# Patient Record
Sex: Female | Born: 1960
Health system: Southern US, Community
[De-identification: ages and names within clinical notes are randomized; demographics above are authoritative.]

## PROBLEM LIST (undated history)

## (undated) DIAGNOSIS — Z923 Personal history of irradiation: Secondary | ICD-10-CM

## (undated) DIAGNOSIS — Z9221 Personal history of antineoplastic chemotherapy: Secondary | ICD-10-CM

## (undated) DIAGNOSIS — M549 Dorsalgia, unspecified: Secondary | ICD-10-CM

## (undated) DIAGNOSIS — R0902 Hypoxemia: Secondary | ICD-10-CM

## (undated) DIAGNOSIS — Z800 Family history of malignant neoplasm of digestive organs: Secondary | ICD-10-CM

## (undated) DIAGNOSIS — M1990 Unspecified osteoarthritis, unspecified site: Secondary | ICD-10-CM

## (undated) DIAGNOSIS — Z9289 Personal history of other medical treatment: Secondary | ICD-10-CM

## (undated) DIAGNOSIS — K219 Gastro-esophageal reflux disease without esophagitis: Secondary | ICD-10-CM

## (undated) DIAGNOSIS — F419 Anxiety disorder, unspecified: Secondary | ICD-10-CM

## (undated) DIAGNOSIS — G8929 Other chronic pain: Secondary | ICD-10-CM

## (undated) DIAGNOSIS — I82409 Acute embolism and thrombosis of unspecified deep veins of unspecified lower extremity: Secondary | ICD-10-CM

## (undated) DIAGNOSIS — C189 Malignant neoplasm of colon, unspecified: Secondary | ICD-10-CM

## (undated) DIAGNOSIS — Z9889 Other specified postprocedural states: Secondary | ICD-10-CM

## (undated) DIAGNOSIS — IMO0001 Reserved for inherently not codable concepts without codable children: Secondary | ICD-10-CM

## (undated) DIAGNOSIS — Z8489 Family history of other specified conditions: Secondary | ICD-10-CM

## (undated) DIAGNOSIS — C50919 Malignant neoplasm of unspecified site of unspecified female breast: Secondary | ICD-10-CM

## (undated) DIAGNOSIS — I10 Essential (primary) hypertension: Secondary | ICD-10-CM

## (undated) DIAGNOSIS — O24419 Gestational diabetes mellitus in pregnancy, unspecified control: Secondary | ICD-10-CM

## (undated) DIAGNOSIS — Z803 Family history of malignant neoplasm of breast: Secondary | ICD-10-CM

## (undated) DIAGNOSIS — G43909 Migraine, unspecified, not intractable, without status migrainosus: Secondary | ICD-10-CM

## (undated) DIAGNOSIS — R112 Nausea with vomiting, unspecified: Secondary | ICD-10-CM

## (undated) DIAGNOSIS — IMO0002 Reserved for concepts with insufficient information to code with codable children: Secondary | ICD-10-CM

## (undated) DIAGNOSIS — C50311 Malignant neoplasm of lower-inner quadrant of right female breast: Secondary | ICD-10-CM

## (undated) HISTORY — PX: ENDOMETRIAL ABLATION: SHX621

## (undated) HISTORY — DX: Reserved for inherently not codable concepts without codable children: IMO0001

## (undated) HISTORY — DX: Family history of malignant neoplasm of breast: Z80.3

## (undated) HISTORY — PX: RADIOFREQUENCY ABLATION NERVES: SUR1070

## (undated) HISTORY — DX: Reserved for concepts with insufficient information to code with codable children: IMO0002

## (undated) HISTORY — DX: Malignant neoplasm of unspecified site of unspecified female breast: C50.919

## (undated) HISTORY — DX: Family history of malignant neoplasm of digestive organs: Z80.0

## (undated) HISTORY — PX: DENTAL SURGERY: SHX609

## (undated) HISTORY — PX: PLANTAR FASCIA RELEASE: SHX2239

## (undated) HISTORY — PX: EYE MUSCLE SURGERY: SHX370

## (undated) HISTORY — PX: BACK SURGERY: SHX140

---

## 1986-06-15 HISTORY — PX: TONSILLECTOMY: SUR1361

## 1994-06-15 HISTORY — PX: TUBAL LIGATION: SHX77

## 1997-09-11 ENCOUNTER — Other Ambulatory Visit: Admission: RE | Admit: 1997-09-11 | Discharge: 1997-09-11 | Payer: Self-pay | Admitting: Obstetrics and Gynecology

## 1998-12-05 ENCOUNTER — Other Ambulatory Visit: Admission: RE | Admit: 1998-12-05 | Discharge: 1998-12-05 | Payer: Self-pay | Admitting: Obstetrics and Gynecology

## 2000-09-21 ENCOUNTER — Other Ambulatory Visit: Admission: RE | Admit: 2000-09-21 | Discharge: 2000-09-21 | Payer: Self-pay | Admitting: Obstetrics and Gynecology

## 2002-01-04 ENCOUNTER — Other Ambulatory Visit: Admission: RE | Admit: 2002-01-04 | Discharge: 2002-01-04 | Payer: Self-pay | Admitting: Obstetrics and Gynecology

## 2002-05-02 ENCOUNTER — Ambulatory Visit (HOSPITAL_COMMUNITY): Admission: RE | Admit: 2002-05-02 | Discharge: 2002-05-02 | Payer: Self-pay | Admitting: Obstetrics and Gynecology

## 2004-08-14 ENCOUNTER — Other Ambulatory Visit: Admission: RE | Admit: 2004-08-14 | Discharge: 2004-08-14 | Payer: Self-pay | Admitting: Obstetrics and Gynecology

## 2010-06-15 DIAGNOSIS — Z9289 Personal history of other medical treatment: Secondary | ICD-10-CM

## 2010-06-15 HISTORY — DX: Personal history of other medical treatment: Z92.89

## 2013-06-15 HISTORY — PX: COLONOSCOPY W/ POLYPECTOMY: SHX1380

## 2013-06-15 HISTORY — PX: EXCISIONAL HEMORRHOIDECTOMY: SHX1541

## 2013-09-08 ENCOUNTER — Other Ambulatory Visit: Payer: Self-pay | Admitting: Orthopedic Surgery

## 2013-09-08 DIAGNOSIS — M533 Sacrococcygeal disorders, not elsewhere classified: Secondary | ICD-10-CM

## 2013-09-08 DIAGNOSIS — M545 Low back pain, unspecified: Secondary | ICD-10-CM

## 2013-09-14 ENCOUNTER — Other Ambulatory Visit: Payer: Self-pay | Admitting: Orthopedic Surgery

## 2013-09-14 DIAGNOSIS — M545 Low back pain, unspecified: Secondary | ICD-10-CM

## 2013-09-14 DIAGNOSIS — M533 Sacrococcygeal disorders, not elsewhere classified: Secondary | ICD-10-CM

## 2013-09-19 ENCOUNTER — Ambulatory Visit
Admission: RE | Admit: 2013-09-19 | Discharge: 2013-09-19 | Disposition: A | Discharge status: Home / Self Care | Payer: Worker's Compensation | Source: Ambulatory Visit | Attending: Orthopedic Surgery | Admitting: Orthopedic Surgery

## 2013-09-19 DIAGNOSIS — M533 Sacrococcygeal disorders, not elsewhere classified: Secondary | ICD-10-CM

## 2013-09-19 DIAGNOSIS — M545 Low back pain, unspecified: Secondary | ICD-10-CM

## 2013-09-19 MED ORDER — METHYLPREDNISOLONE ACETATE 40 MG/ML INJ SUSP (RADIOLOG
120.0000 mg | Freq: Once | INTRAMUSCULAR | Status: AC
  Administered 2013-09-19: 120 mg via INTRA_ARTICULAR

## 2014-02-13 ENCOUNTER — Other Ambulatory Visit: Payer: Self-pay | Admitting: Orthopedic Surgery

## 2014-02-16 ENCOUNTER — Encounter (HOSPITAL_COMMUNITY): Payer: Self-pay | Admitting: Pharmacy Technician

## 2014-02-21 ENCOUNTER — Encounter (HOSPITAL_COMMUNITY)
Admission: RE | Admit: 2014-02-21 | Discharge: 2014-02-21 | Disposition: A | Discharge status: Home / Self Care | Payer: Worker's Compensation | Source: Ambulatory Visit | Attending: Orthopedic Surgery | Admitting: Orthopedic Surgery

## 2014-02-21 ENCOUNTER — Encounter (HOSPITAL_COMMUNITY): Payer: Self-pay

## 2014-02-21 HISTORY — DX: Other specified postprocedural states: Z98.890

## 2014-02-21 HISTORY — DX: Migraine, unspecified, not intractable, without status migrainosus: G43.909

## 2014-02-21 HISTORY — DX: Nausea with vomiting, unspecified: R11.2

## 2014-02-21 HISTORY — DX: Gestational diabetes mellitus in pregnancy, unspecified control: O24.419

## 2014-02-21 LAB — URINALYSIS, ROUTINE W REFLEX MICROSCOPIC
Glucose, UA: NEGATIVE mg/dL
KETONES UR: 15 mg/dL — AB
NITRITE: POSITIVE — AB
PROTEIN: NEGATIVE mg/dL
Specific Gravity, Urine: 1.029 (ref 1.005–1.030)
UROBILINOGEN UA: 1 mg/dL (ref 0.0–1.0)
pH: 5 (ref 5.0–8.0)

## 2014-02-21 LAB — URINE MICROSCOPIC-ADD ON

## 2014-02-21 LAB — TYPE AND SCREEN
ABO/RH(D): AB POS
Antibody Screen: NEGATIVE

## 2014-02-21 LAB — CBC WITH DIFFERENTIAL/PLATELET
BASOS ABS: 0 10*3/uL (ref 0.0–0.1)
Basophils Relative: 0 % (ref 0–1)
EOS ABS: 0.1 10*3/uL (ref 0.0–0.7)
Eosinophils Relative: 2 % (ref 0–5)
HCT: 41.7 % (ref 36.0–46.0)
Hemoglobin: 14 g/dL (ref 12.0–15.0)
LYMPHS ABS: 2.1 10*3/uL (ref 0.7–4.0)
Lymphocytes Relative: 39 % (ref 12–46)
MCH: 30.6 pg (ref 26.0–34.0)
MCHC: 33.6 g/dL (ref 30.0–36.0)
MCV: 91 fL (ref 78.0–100.0)
Monocytes Absolute: 0.4 10*3/uL (ref 0.1–1.0)
Monocytes Relative: 8 % (ref 3–12)
NEUTROS PCT: 51 % (ref 43–77)
Neutro Abs: 2.7 10*3/uL (ref 1.7–7.7)
Platelets: 234 10*3/uL (ref 150–400)
RBC: 4.58 MIL/uL (ref 3.87–5.11)
RDW: 13.8 % (ref 11.5–15.5)
WBC: 5.3 10*3/uL (ref 4.0–10.5)

## 2014-02-21 LAB — COMPREHENSIVE METABOLIC PANEL
ALT: 32 U/L (ref 0–35)
AST: 24 U/L (ref 0–37)
Albumin: 4.2 g/dL (ref 3.5–5.2)
Alkaline Phosphatase: 88 U/L (ref 39–117)
Anion gap: 14 (ref 5–15)
BUN: 15 mg/dL (ref 6–23)
CO2: 22 mEq/L (ref 19–32)
Calcium: 9.3 mg/dL (ref 8.4–10.5)
Chloride: 106 mEq/L (ref 96–112)
Creatinine, Ser: 0.83 mg/dL (ref 0.50–1.10)
GFR calc Af Amer: >90 mL/min (ref >90)
GFR calc non Af Amer: 79 mL/min — ABNORMAL LOW (ref >90)
GLUCOSE: 118 mg/dL — AB (ref 70–99)
POTASSIUM: 4.1 meq/L (ref 3.7–5.3)
SODIUM: 142 meq/L (ref 137–147)
TOTAL PROTEIN: 7.2 g/dL (ref 6.0–8.3)
Total Bilirubin: 0.4 mg/dL (ref 0.3–1.2)

## 2014-02-21 LAB — ABO/RH: ABO/RH(D): AB POS

## 2014-02-21 LAB — APTT: aPTT: 29 seconds (ref 24–37)

## 2014-02-21 LAB — PROTIME-INR
INR: 1.06 (ref 0.00–1.49)
Prothrombin Time: 13.8 seconds (ref 11.6–15.2)

## 2014-02-21 LAB — SURGICAL PCR SCREEN
MRSA, PCR: NEGATIVE
Staphylococcus aureus: NEGATIVE

## 2014-02-21 MED ORDER — POVIDONE-IODINE 7.5 % EX SOLN
Freq: Once | CUTANEOUS | Status: DC
  Filled 2014-02-21: qty 118

## 2014-02-21 MED ORDER — CEFAZOLIN SODIUM-DEXTROSE 2-3 GM-% IV SOLR
2.0000 g | INTRAVENOUS | Status: DC
  Filled 2014-02-21: qty 50

## 2014-02-21 NOTE — Progress Notes (Signed)
Primary - dr. Marlyn Corporal - [facility] Does not have cardiologist Had ekg about 2 years ago after her dad passed away

## 2014-02-21 NOTE — Pre-Procedure Instructions (Signed)
Teresa Aguilar  02/21/2014   Your procedure is scheduled on:  Thursday, September 10th  Report to [facility] Admitting at 0900 AM.  Call this number if you have problems the morning of surgery: [phone]   Remember:   Do not eat food or drink liquids after midnight.   Take these medicines the morning of surgery with A SIP OF WATER: none   Do not wear jewelry, make-up or nail polish.  Do not wear lotions, powders, or perfumes. You may wear deodorant.  Do not shave 48 hours prior to surgery. Men may shave face and neck.  Do not bring valuables to the hospital.  [facility] is not responsible  for any belongings or valuables.               Contacts, dentures or bridgework may not be worn into surgery.  Leave suitcase in the car. After surgery it may be brought to your room.  For patients admitted to the hospital, discharge time is determined by your  treatment team.               Patients discharged the day of surgery will not be allowed to drive home.  Please read over the following fact sheets that you were given: Pain Booklet, Coughing and Deep Breathing, Blood Transfusion Information and Surgical Site Infection Prevention [facility] - Preparing for Surgery  Before surgery, you can play an important role.  Because skin is not sterile, your skin needs to be as free of germs as possible.  You can reduce the number of germs on you skin by washing with CHG (chlorahexidine gluconate) soap before surgery.  CHG is an antiseptic cleaner which kills germs and bonds with the skin to continue killing germs even after washing.  Please DO NOT use if you have an allergy to CHG or antibacterial soaps.  If your skin becomes reddened/irritated stop using the CHG and inform your nurse when you arrive at Short Stay.  Do not shave (including legs and underarms) for at least 48 hours prior to the first CHG shower.  You may shave your face.  Please follow these instructions  carefully:   1.  Shower with CHG Soap the night before surgery and the morning of Surgery.  2.  If you choose to wash your hair, wash your hair first as usual with your normal shampoo.  3.  After you shampoo, rinse your hair and body thoroughly to remove the shampoo.  4.  Use CHG as you would any other liquid soap.  You can apply CHG directly to the skin and wash gently with scrungie or a clean washcloth.  5.  Apply the CHG Soap to your body ONLY FROM THE NECK DOWN.  Do not use on open wounds or open sores.  Avoid contact with your eyes, ears, mouth and genitals (private parts).  Wash genitals (private parts) with your normal soap.  6.  Wash thoroughly, paying special attention to the area where your surgery will be performed.  7.  Thoroughly rinse your body with warm water from the neck down.  8.  DO NOT shower/wash with your normal soap after using and rinsing off the CHG Soap.  9.  Pat yourself dry with a clean towel.            10.  Wear clean pajamas.            11.  Place clean sheets on your bed the night of  your first shower and do not sleep with pets.  Day of Surgery  Do not apply any lotions/deoderants the morning of surgery.  Please wear clean clothes to the hospital/surgery center.

## 2014-02-22 ENCOUNTER — Ambulatory Visit (HOSPITAL_COMMUNITY): Payer: Worker's Compensation | Admitting: Anesthesiology

## 2014-02-22 ENCOUNTER — Ambulatory Visit (HOSPITAL_COMMUNITY)
Admission: RE | Admit: 2014-02-22 | Discharge: 2014-02-23 | Disposition: A | Discharge status: Home / Self Care | Payer: Worker's Compensation | Source: Ambulatory Visit | Attending: Orthopedic Surgery | Admitting: Orthopedic Surgery

## 2014-02-22 ENCOUNTER — Ambulatory Visit (HOSPITAL_COMMUNITY): Payer: Worker's Compensation

## 2014-02-22 ENCOUNTER — Encounter (HOSPITAL_COMMUNITY): Payer: Worker's Compensation | Admitting: Anesthesiology

## 2014-02-22 ENCOUNTER — Encounter (HOSPITAL_COMMUNITY): Payer: Self-pay | Admitting: *Deleted

## 2014-02-22 ENCOUNTER — Encounter (HOSPITAL_COMMUNITY)
Admission: RE | Disposition: A | Discharge status: Home / Self Care | Payer: Self-pay | Source: Ambulatory Visit | Attending: Orthopedic Surgery

## 2014-02-22 DIAGNOSIS — M2580 Other specified joint disorders, unspecified joint: Secondary | ICD-10-CM | POA: Insufficient documentation

## 2014-02-22 DIAGNOSIS — M532X8 Spinal instabilities, sacral and sacrococcygeal region: Secondary | ICD-10-CM | POA: Diagnosis present

## 2014-02-22 DIAGNOSIS — M533 Sacrococcygeal disorders, not elsewhere classified: Secondary | ICD-10-CM | POA: Insufficient documentation

## 2014-02-22 DIAGNOSIS — G43909 Migraine, unspecified, not intractable, without status migrainosus: Secondary | ICD-10-CM | POA: Insufficient documentation

## 2014-02-22 HISTORY — PX: SACROILIAC JOINT FUSION: SHX6088

## 2014-02-22 SURGERY — SACROILIAC JOINT FUSION
Anesthesia: General | Laterality: Right

## 2014-02-22 MED ORDER — PROPOFOL 10 MG/ML IV BOLUS
INTRAVENOUS | Status: DC | PRN
  Administered 2014-02-22: 200 mg via INTRAVENOUS

## 2014-02-22 MED ORDER — SCOPOLAMINE 1 MG/3DAYS TD PT72
MEDICATED_PATCH | TRANSDERMAL | Status: AC
  Filled 2014-02-22: qty 1

## 2014-02-22 MED ORDER — PHENYLEPHRINE HCL 10 MG/ML IJ SOLN
INTRAMUSCULAR | Status: DC | PRN
  Administered 2014-02-22 (×6): 80 ug via INTRAVENOUS

## 2014-02-22 MED ORDER — ROCURONIUM BROMIDE 50 MG/5ML IV SOLN
INTRAVENOUS | Status: AC
  Filled 2014-02-22: qty 1

## 2014-02-22 MED ORDER — CEFAZOLIN SODIUM-DEXTROSE 2-3 GM-% IV SOLR
INTRAVENOUS | Status: AC
  Administered 2014-02-22: 2 g via INTRAVENOUS
  Filled 2014-02-22: qty 50

## 2014-02-22 MED ORDER — HYDROMORPHONE HCL PF 1 MG/ML IJ SOLN
0.2500 mg | INTRAMUSCULAR | Status: DC | PRN
  Administered 2014-02-22 (×6): 0.5 mg via INTRAVENOUS

## 2014-02-22 MED ORDER — DEXAMETHASONE SODIUM PHOSPHATE 10 MG/ML IJ SOLN
INTRAMUSCULAR | Status: DC | PRN
  Administered 2014-02-22: 8 mg via INTRAVENOUS

## 2014-02-22 MED ORDER — ONDANSETRON HCL 4 MG/2ML IJ SOLN
INTRAMUSCULAR | Status: AC
  Filled 2014-02-22: qty 2

## 2014-02-22 MED ORDER — OXYCODONE HCL 5 MG PO TABS
ORAL_TABLET | ORAL | Status: AC
  Administered 2014-02-22: 5 mg
  Filled 2014-02-22: qty 1

## 2014-02-22 MED ORDER — PROPOFOL 10 MG/ML IV BOLUS
INTRAVENOUS | Status: AC
  Filled 2014-02-22: qty 20

## 2014-02-22 MED ORDER — LIDOCAINE HCL (CARDIAC) 20 MG/ML IV SOLN
INTRAVENOUS | Status: DC | PRN
  Administered 2014-02-22: 100 mg via INTRAVENOUS

## 2014-02-22 MED ORDER — BUPIVACAINE-EPINEPHRINE (PF) 0.25% -1:200000 IJ SOLN
INTRAMUSCULAR | Status: AC
  Filled 2014-02-22: qty 30

## 2014-02-22 MED ORDER — BUPIVACAINE-EPINEPHRINE (PF) 0.25% -1:200000 IJ SOLN
INTRAMUSCULAR | Status: DC | PRN
  Administered 2014-02-22: 17 mL via PERINEURAL

## 2014-02-22 MED ORDER — SODIUM CHLORIDE 0.9 % IJ SOLN
INTRAMUSCULAR | Status: AC
  Filled 2014-02-22: qty 10

## 2014-02-22 MED ORDER — EPHEDRINE SULFATE 50 MG/ML IJ SOLN
INTRAMUSCULAR | Status: AC
  Filled 2014-02-22: qty 1

## 2014-02-22 MED ORDER — LIDOCAINE HCL (CARDIAC) 20 MG/ML IV SOLN
INTRAVENOUS | Status: AC
  Filled 2014-02-22: qty 5

## 2014-02-22 MED ORDER — LACTATED RINGERS IV SOLN
INTRAVENOUS | Status: DC | PRN
  Administered 2014-02-22 (×2): via INTRAVENOUS

## 2014-02-22 MED ORDER — ROCURONIUM BROMIDE 100 MG/10ML IV SOLN
INTRAVENOUS | Status: DC | PRN
  Administered 2014-02-22: 40 mg via INTRAVENOUS

## 2014-02-22 MED ORDER — LACTATED RINGERS IV SOLN
INTRAVENOUS | Status: DC
  Administered 2014-02-22: 09:00:00 via INTRAVENOUS

## 2014-02-22 MED ORDER — SCOPOLAMINE 1 MG/3DAYS TD PT72
1.0000 | MEDICATED_PATCH | TRANSDERMAL | Status: DC
  Administered 2014-02-22: 1.5 mg via TRANSDERMAL

## 2014-02-22 MED ORDER — DIPHENHYDRAMINE HCL 50 MG/ML IJ SOLN
INTRAMUSCULAR | Status: DC | PRN
  Administered 2014-02-22: 12.5 mg via INTRAVENOUS

## 2014-02-22 MED ORDER — ACETAMINOPHEN 10 MG/ML IV SOLN
INTRAVENOUS | Status: AC
  Administered 2014-02-22: 1000 mg via INTRAVENOUS
  Filled 2014-02-22: qty 100

## 2014-02-22 MED ORDER — FENTANYL CITRATE 0.05 MG/ML IJ SOLN
INTRAMUSCULAR | Status: AC
  Filled 2014-02-22: qty 5

## 2014-02-22 MED ORDER — ONDANSETRON HCL 4 MG/2ML IJ SOLN
INTRAMUSCULAR | Status: DC | PRN
  Administered 2014-02-22: 4 mg via INTRAVENOUS

## 2014-02-22 MED ORDER — HYDROMORPHONE HCL PF 1 MG/ML IJ SOLN
INTRAMUSCULAR | Status: AC
  Filled 2014-02-22: qty 1

## 2014-02-22 MED ORDER — NEOSTIGMINE METHYLSULFATE 10 MG/10ML IV SOLN
INTRAVENOUS | Status: DC | PRN
  Administered 2014-02-22: 4 mg via INTRAVENOUS

## 2014-02-22 MED ORDER — GLYCOPYRROLATE 0.2 MG/ML IJ SOLN
INTRAMUSCULAR | Status: DC | PRN
  Administered 2014-02-22: .8 mg via INTRAVENOUS

## 2014-02-22 MED ORDER — ARTIFICIAL TEARS OP OINT
TOPICAL_OINTMENT | OPHTHALMIC | Status: DC | PRN
  Administered 2014-02-22: 1 via OPHTHALMIC

## 2014-02-22 MED ORDER — NEOSTIGMINE METHYLSULFATE 10 MG/10ML IV SOLN
INTRAVENOUS | Status: AC
  Filled 2014-02-22: qty 1

## 2014-02-22 MED ORDER — MIDAZOLAM HCL 2 MG/2ML IJ SOLN
INTRAMUSCULAR | Status: AC
  Filled 2014-02-22: qty 2

## 2014-02-22 MED ORDER — OXYCODONE-ACETAMINOPHEN 5-325 MG PO TABS
1.0000 | ORAL_TABLET | ORAL | Status: DC | PRN
  Administered 2014-02-22: 1 via ORAL
  Administered 2014-02-23 (×2): 2 via ORAL
  Filled 2014-02-22: qty 1
  Filled 2014-02-22 (×2): qty 2

## 2014-02-22 MED ORDER — FENTANYL CITRATE 0.05 MG/ML IJ SOLN
INTRAMUSCULAR | Status: DC | PRN
  Administered 2014-02-22: 100 ug via INTRAVENOUS

## 2014-02-22 MED ORDER — BUPIVACAINE HCL (PF) 0.25 % IJ SOLN
INTRAMUSCULAR | Status: AC
  Filled 2014-02-22: qty 30

## 2014-02-22 MED ORDER — MIDAZOLAM HCL 5 MG/5ML IJ SOLN
INTRAMUSCULAR | Status: DC | PRN
  Administered 2014-02-22: 2 mg via INTRAVENOUS

## 2014-02-22 MED ORDER — DIAZEPAM 5 MG PO TABS: 5.0000 mg | ORAL_TABLET | Freq: Four times a day (QID) | ORAL | Status: DC | PRN

## 2014-02-22 MED ORDER — SUCCINYLCHOLINE CHLORIDE 20 MG/ML IJ SOLN
INTRAMUSCULAR | Status: AC
  Filled 2014-02-22: qty 1

## 2014-02-22 MED ORDER — ARTIFICIAL TEARS OP OINT
TOPICAL_OINTMENT | OPHTHALMIC | Status: AC
  Filled 2014-02-22: qty 3.5

## 2014-02-22 MED ORDER — PROMETHAZINE HCL 25 MG/ML IJ SOLN: 6.2500 mg | INTRAMUSCULAR | Status: DC | PRN

## 2014-02-22 MED ORDER — GLYCOPYRROLATE 0.2 MG/ML IJ SOLN
INTRAMUSCULAR | Status: AC
Start: 2014-02-22 — End: 2014-02-22
  Filled 2014-02-22: qty 3

## 2014-02-22 SURGICAL SUPPLY — 56 items
APL SKNCLS STERI-STRIP NONHPOA (GAUZE/BANDAGES/DRESSINGS) ×1
BENZOIN TINCTURE PRP APPL 2/3 (GAUZE/BANDAGES/DRESSINGS) ×3 IMPLANT
BLADE SURG 10 STRL SS (BLADE) ×3 IMPLANT
BLADE SURG 11 STRL SS (BLADE) ×3 IMPLANT
BLADE SURG ROTATE 9660 (MISCELLANEOUS) ×3 IMPLANT
CANISTER SUCTION 2500CC (MISCELLANEOUS) ×3 IMPLANT
CAP-I-FUSE IMPLANT SYSTEM ×2 IMPLANT
CLOSURE WOUND 1/2 X4 (GAUZE/BANDAGES/DRESSINGS) ×1
COVER SURGICAL LIGHT HANDLE (MISCELLANEOUS) ×6 IMPLANT
DRAPE C-ARM 42X72 X-RAY (DRAPES) ×3 IMPLANT
DRAPE C-ARMOR (DRAPES) ×3 IMPLANT
DRAPE INCISE IOBAN 66X45 STRL (DRAPES) ×3 IMPLANT
DRAPE POUCH INSTRU U-SHP 10X18 (DRAPES) ×3 IMPLANT
DRAPE SURG 17X23 STRL (DRAPES) ×9 IMPLANT
DURAPREP 26ML APPLICATOR (WOUND CARE) ×3 IMPLANT
ELECT CAUTERY BLADE 6.4 (BLADE) ×3 IMPLANT
ELECT REM PT RETURN 9FT ADLT (ELECTROSURGICAL) ×3
ELECTRODE REM PT RTRN 9FT ADLT (ELECTROSURGICAL) ×1 IMPLANT
GAUZE SPONGE 4X4 12PLY STRL (GAUZE/BANDAGES/DRESSINGS) ×3 IMPLANT
GAUZE SPONGE 4X4 16PLY XRAY LF (GAUZE/BANDAGES/DRESSINGS) ×3 IMPLANT
GLOVE BIO SURGEON STRL SZ7 (GLOVE) ×3 IMPLANT
GLOVE BIO SURGEON STRL SZ8 (GLOVE) ×3 IMPLANT
GLOVE BIOGEL PI IND STRL 7.0 (GLOVE) ×1 IMPLANT
GLOVE BIOGEL PI IND STRL 8 (GLOVE) ×1 IMPLANT
GLOVE BIOGEL PI INDICATOR 7.0 (GLOVE) ×2
GLOVE BIOGEL PI INDICATOR 8 (GLOVE) ×2
GOWN STRL REUS W/ TWL LRG LVL3 (GOWN DISPOSABLE) ×2 IMPLANT
GOWN STRL REUS W/ TWL XL LVL3 (GOWN DISPOSABLE) ×1 IMPLANT
GOWN STRL REUS W/TWL LRG LVL3 (GOWN DISPOSABLE) ×6
GOWN STRL REUS W/TWL XL LVL3 (GOWN DISPOSABLE) ×3
KIT BASIN OR (CUSTOM PROCEDURE TRAY) ×3 IMPLANT
KIT ROOM TURNOVER OR (KITS) ×3 IMPLANT
MANIFOLD NEPTUNE II (INSTRUMENTS) ×3 IMPLANT
NDL HYPO 25GX1X1/2 BEV (NEEDLE) ×1 IMPLANT
NEEDLE 22X1 1/2 (OR ONLY) (NEEDLE) ×3 IMPLANT
NEEDLE HYPO 25GX1X1/2 BEV (NEEDLE) ×3 IMPLANT
NS IRRIG 1000ML POUR BTL (IV SOLUTION) ×3 IMPLANT
PACK UNIVERSAL I (CUSTOM PROCEDURE TRAY) ×3 IMPLANT
PAD ARMBOARD 7.5X6 YLW CONV (MISCELLANEOUS) ×6 IMPLANT
PENCIL BUTTON HOLSTER BLD 10FT (ELECTRODE) ×3 IMPLANT
SPONGE GAUZE 4X4 12PLY STER LF (GAUZE/BANDAGES/DRESSINGS) ×2 IMPLANT
SPONGE LAP 18X18 X RAY DECT (DISPOSABLE) ×3 IMPLANT
STAPLER VISISTAT 35W (STAPLE) ×3 IMPLANT
STRIP CLOSURE SKIN 1/2X4 (GAUZE/BANDAGES/DRESSINGS) ×2 IMPLANT
SUT MNCRL AB 4-0 PS2 18 (SUTURE) ×3 IMPLANT
SUT VIC AB 0 CT1 18XCR BRD 8 (SUTURE) ×1 IMPLANT
SUT VIC AB 0 CT1 8-18 (SUTURE) ×3
SUT VIC AB 2-0 CT2 18 VCP726D (SUTURE) ×3 IMPLANT
SYR BULB IRRIGATION 50ML (SYRINGE) ×3 IMPLANT
SYR CONTROL 10ML LL (SYRINGE) ×3 IMPLANT
TOWEL OR 17X24 6PK STRL BLUE (TOWEL DISPOSABLE) ×3 IMPLANT
TOWEL OR 17X26 10 PK STRL BLUE (TOWEL DISPOSABLE) ×6 IMPLANT
TUBE CONNECTING 12'X1/4 (SUCTIONS) ×1
TUBE CONNECTING 12X1/4 (SUCTIONS) ×2 IMPLANT
WATER STERILE IRR 1000ML POUR (IV SOLUTION) ×3 IMPLANT
YANKAUER SUCT BULB TIP NO VENT (SUCTIONS) ×3 IMPLANT

## 2014-02-22 NOTE — Progress Notes (Signed)
Spoke with Pricilla Holm, PA for dr. Lynann Bologna.  Patient noted to be having apnea and desaturations to the 50's while asleep, encouraged cough and deep breathing.  O2 saturations return to baseline of 90's when awakened but quickly return to 50's when asleep.  Patient returned to PACU pending admission to the hospital.

## 2014-02-22 NOTE — Anesthesia Procedure Notes (Signed)
Procedure Name: Intubation Date/Time: 02/22/2014 11:01 AM Performed by: Neldon Newport Pre-anesthesia Checklist: Patient identified, Emergency Drugs available, Timeout performed, Suction available and Patient being monitored Patient Re-evaluated:Patient Re-evaluated prior to inductionOxygen Delivery Method: Circle system utilized Preoxygenation: Pre-oxygenation with 100% oxygen Intubation Type: IV induction Ventilation: Mask ventilation without difficulty Laryngoscope Size: Mac and 3 Grade View: Grade I Tube type: Oral Tube size: 7.5 mm Number of attempts: 1 Placement Confirmation: positive ETCO2,  ETT inserted through vocal cords under direct vision and breath sounds checked- equal and bilateral Secured at: 22 cm Tube secured with: Tape Dental Injury: Teeth and Oropharynx as per pre-operative assessment

## 2014-02-22 NOTE — H&P (Signed)
     PREOPERATIVE H&P  Chief Complaint: Right low back pain  HPI: Teresa Aguilar is a 53 y.o. female who presents with ongoing pain in the right low back  Patient reports substantial relief with right SI joint injection  Patient has failed multiple forms of conservative care and continues to have pain (see office notes for additional details regarding the patient's full course of treatment)  Past Medical History  Diagnosis Date  . PONV (postoperative nausea and vomiting)   . Gestational diabetes   . Migraines    Past Surgical History  Procedure Laterality Date  . Endometrial ablation    . Colonoscopy w/ polypectomy    . Plantar fascia release Right   . Tonsillectomy    . Eye surgery      to fix cross-eye as child   History   Social History  . Marital Status: Married    Spouse Name: N/A    Number of Children: N/A  . Years of Education: N/A   Social History Main Topics  . Smoking status: Never Smoker   . Smokeless tobacco: Not on file  . Alcohol Use: No  . Drug Use: No  . Sexual Activity: Not on file   Other Topics Concern  . Not on file   Social History Narrative  . No narrative on file   No family history on file. Allergies  Allergen Reactions  . Morphine And Related Nausea And Vomiting  . Sulfa Antibiotics Hives   Prior to Admission medications   Medication Sig Start Date End Date Taking? Authorizing Provider  eletriptan (RELPAX) 40 MG tablet Take 40 mg by mouth as needed for migraine or headache. One tablet by mouth at onset of headache. May repeat in 2 hours if headache persists or recurs.   Yes Historical Provider, MD  loratadine-pseudoephedrine (CLARITIN-D 12-HOUR) 5-120 MG per tablet Take 1 tablet by mouth 2 (two) times daily as needed for allergies.   Yes Historical Provider, MD  naproxen (NAPROSYN) 500 MG tablet Take 500 mg by mouth 2 (two) times daily with a meal.   Yes Historical Provider, MD     All other systems have been reviewed and  were otherwise negative with the exception of those mentioned in the HPI and as above.  Physical Exam: There were no vitals filed for this visit.  General: Alert, no acute distress Cardiovascular: No pedal edema Respiratory: No cyanosis, no use of accessory musculature Skin: No lesions in the area of chief complaint Neurologic: Sensation intact distally Psychiatric: Patient is competent for consent with normal mood and affect Lymphatic: No axillary or cervical lymphadenopathy  MUSCULOSKELETAL: + TTP on right low back  Assessment/Plan: Right sided sacroiliac joint dysfunction Plan for Procedure(s): SACROILIAC JOINT FUSION   Sinclair Ship, MD 02/22/2014 7:12 AM

## 2014-02-22 NOTE — Progress Notes (Signed)
Orthopedic Tech Progress Note Patient Details:  Teresa Aguilar 05/23/1961 [id number]  Ortho Devices Type of Ortho Device: Crutches Ortho Device/Splint Interventions: Application   [name] 02/22/2014, 4:40 PM

## 2014-02-22 NOTE — Transfer of Care (Signed)
Immediate Anesthesia Transfer of Care Note  Patient: Teresa Aguilar  Procedure(s) Performed: Procedure(s) with comments: SACROILIAC JOINT FUSION (Right) - Right sided sacroiliac joint fusion  Patient Location: PACU  Anesthesia Type:General  Level of Consciousness: awake, alert  and oriented  Airway & Oxygen Therapy: Patient Spontanous Breathing and Patient connected to nasal cannula oxygen  Post-op Assessment: Report given to PACU RN, Post -op Vital signs reviewed and stable and Patient moving all extremities X 4  Post vital signs: Reviewed and stable  Complications: No apparent anesthesia complications

## 2014-02-22 NOTE — Op Note (Signed)
Teresa Aguilar, Teresa Aguilar NO.:  [id number]  MEDICAL RECORD NO.:  [id number]  LOCATION:  [id number]                        FACILITY:  [facility]  PHYSICIAN:  Phylliss Bob, MD      DATE OF BIRTH:  10/01/1960  DATE OF PROCEDURE:  02/22/2014                              OPERATIVE REPORT   PREOPERATIVE DIAGNOSIS:  Right-sided sacroiliac joint dysfunction.  POSTOPERATIVE DIAGNOSIS:  Right-sided sacroiliac joint dysfunction.  PROCEDURE:  Right-sided sacroiliac joint fusion using the iFuse sacroiliac joint fusion system.  SURGEON:  Phylliss Bob, MD  ASSISTANT:  Pricilla Holm, PA-C.  ANESTHESIA:  General endotracheal anesthesia.  COMPLICATIONS:  None.  DISPOSITION:  Stable.  ESTIMATED BLOOD LOSS:  Minimal.  INDICATIONS FOR SURGERY:  Briefly, Ms. Dunsmore is a very pleasant 53- year-old female, who did initially present to me on May 01, 2013, with substantial pain in the right side of her low back, status post a work injury that did occur on January 15, 2013.  The patient was worked up extensively, including a diagnostic right sacroiliac joint injection. The injection did provide her temporary relief.  Given her ongoing debilitating pain, we did discuss proceeding with the procedure as noted above, and she did elect to proceed.  OPERATIVE DETAILS:  On February 22, 2014, the patient was brought to surgery and general endotracheal anesthesia was administered.  The patient was placed prone on a well-padded flat Jackson bed.  Gel rolls were placed under the patient's chest and hips.  Region of the right buttock was prepped and draped in the usual fashion.  Time-out was performed.  I then made a 4 cm incision overlying the posterior border of the sacrum on the right side.  Three guidewires were advanced across the sacroiliac joint, 1 above the S1 foramen, 1 in line with it, and 1 beneath it.  I then drilled and broached over the guidewires and the appropriate sized  implants were advanced over the guidewires uneventfully.  I did liberally use lateral inlet and outlet fluoroscopy to confirm the appropriate positioning of the guidewires and implants. The guidewires were then removed.  I was very pleased with the press fit of each of the implants.  The wound was then copiously irrigated.  The fascia was then closed using 0 Vicryl, the subcutaneous layer was closed using 2-0 Vicryl, and the skin was closed using 3-0 Monocryl.  Benzoin and Steri- Strips were applied followed by sterile dressing.  All instrument counts were correct at the termination of the procedure.  Of note, Pricilla Holm was my assistant throughout surgery, and did aid in retraction, suctioning, and closure.     Phylliss Bob, MD     MD/MEDQ  D:  02/22/2014  T:  02/22/2014  Job:  [id number]

## 2014-02-22 NOTE — Progress Notes (Signed)
May give additional dilaudid 1 mg and oxycodone IR 5mg  tab - per Dr singer

## 2014-02-22 NOTE — Anesthesia Postprocedure Evaluation (Signed)
Anesthesia Post Note  Patient: Teresa Aguilar  Procedure(s) Performed: Procedure(s) (LRB): SACROILIAC JOINT FUSION (Right)  Anesthesia type: general  Patient location: PACU  Post pain: Pain level controlled  Post assessment: Patient's Cardiovascular Status Stable  Last Vitals:  Filed Vitals:   02/22/14 1415  BP:   Pulse: 71  Temp:   Resp: 18    Post vital signs: Reviewed and stable  Level of consciousness: sedated  Complications: No apparent anesthesia complications

## 2014-02-22 NOTE — Anesthesia Preprocedure Evaluation (Addendum)
Anesthesia Evaluation  Patient identified by MRN, date of birth, ID band Patient awake    Reviewed: Allergy & Precautions, H&P , NPO status , Patient's Chart, lab work & pertinent test results  History of Anesthesia Complications (+) PONV and history of anesthetic complications  Airway Mallampati: II TM Distance: >3 FB Neck ROM: full    Dental  (+) Dental Advidsory Given, Teeth Intact   Pulmonary neg pulmonary ROS,    Pulmonary exam normal       Cardiovascular negative cardio ROS      Neuro/Psych  Headaches, negative psych ROS   GI/Hepatic negative GI ROS, Neg liver ROS,   Endo/Other  diabetes  Renal/GU      Musculoskeletal   Abdominal   Peds  Hematology   Anesthesia Other Findings   Reproductive/Obstetrics                         Anesthesia Physical Anesthesia Plan  ASA: II  Anesthesia Plan: General ETT   Post-op Pain Management:    Induction:   Airway Management Planned:   Additional Equipment:   Intra-op Plan:   Post-operative Plan: Extubation in OR  Informed Consent: I have reviewed the patients History and Physical, chart, labs and discussed the procedure including the risks, benefits and alternatives for the proposed anesthesia with the patient or authorized representative who has indicated his/her understanding and acceptance.   Dental Advisory Given  Plan Discussed with: Anesthesiologist, CRNA and Surgeon  Anesthesia Plan Comments:       Anesthesia Quick Evaluation

## 2014-02-22 NOTE — Discharge Instructions (Signed)
General Anesthesia, Adult, Care After °Refer to this sheet in the next few weeks. These instructions provide you with information on caring for yourself after your procedure. Your health care provider may also give you more specific instructions. Your treatment has been planned according to current medical practices, but problems sometimes occur. Call your health care provider if you have any problems or questions after your procedure. °WHAT TO EXPECT AFTER THE PROCEDURE °After the procedure, it is typical to experience: °· Sleepiness. °· Nausea and vomiting. °HOME CARE INSTRUCTIONS °· For the first 24 hours after general anesthesia: °· Have a responsible person with you. °· Do not drive a car. If you are alone, do not take public transportation. °· Do not drink alcohol. °· Do not take medicine that has not been prescribed by your health care provider. °· Do not sign important papers or make important decisions. °· You may resume a normal diet and activities as directed by your health care provider. °· Change bandages (dressings) as directed. °· If you have questions or problems that seem related to general anesthesia, call the hospital and ask for the anesthetist or anesthesiologist on call. °SEEK MEDICAL CARE IF: °· You have nausea and vomiting that continue the day after anesthesia. °· You develop a rash. °SEEK IMMEDIATE MEDICAL CARE IF:  °· You have difficulty breathing. °· You have chest pain. °· You have any allergic problems. °Document Released: 09/07/2000 Document Revised: 02/01/2013 Document Reviewed: 12/15/2012 °[employer]® Patient Information ©2014 [employer]. ° ° ° °

## 2014-02-23 MED ORDER — INFLUENZA VAC SPLIT QUAD 0.5 ML IM SUSY: 0.5000 mL | PREFILLED_SYRINGE | INTRAMUSCULAR | Status: DC

## 2014-02-23 NOTE — Progress Notes (Signed)
    Patient doing well Admitted for desats in PACU yesterday and for pain control + expected right buttock pain   Physical Exam: Filed Vitals:   02/23/14 0658  BP: 122/61  Pulse: 72  Temp: 97.9 F (36.6 C)  Resp: 18   sats are 99% currently  Dressing in place NVI  POD #1 s/p right SI fusion, doing well with expected minimal right buttock pain  - encourage ambulation - Percocet for pain, Valium for muscle spasms - likely d/c home later today

## 2014-02-26 ENCOUNTER — Encounter (HOSPITAL_COMMUNITY): Payer: Self-pay | Admitting: Orthopedic Surgery

## 2014-03-01 NOTE — Discharge Summary (Signed)
Patient ID: Teresa Aguilar MRN: [id number] DOB/AGE: 1961-05-12 53 y.o.  Admit date: 02/22/2014 Discharge date: 02/23/2014  Admission Diagnoses:  Active Problems:   Instability of sacroiliac joint   Discharge Diagnoses:  Same  Past Medical History  Diagnosis Date  . PONV (postoperative nausea and vomiting)   . Gestational diabetes   . Migraines     Surgeries: Procedure(s): RIGHT SACROILIAC JOINT FUSION on 02/22/2014   Consultants:  None  Discharged Condition: Improved  Hospital Course: Teresa Aguilar is an 53 y.o. female who was admitted 02/22/2014 for operative treatment of sacroiliac joint dysfunction. Patient has severe unremitting pain that affects sleep, daily activities, and work/hobbies. After pre-op clearance the patient was taken to the operating room on 02/22/2014 and underwent  Procedure(s): SACROILIAC JOINT FUSION Right.    Patient was given perioperative antibiotics:  Anti-infectives   Start     Dose/Rate Route Frequency Ordered Stop   02/22/14 0941  ceFAZolin (ANCEF) 2-3 GM-% IVPB SOLR    Comments:  Elige Ko   : cabinet override      02/22/14 0941 02/22/14 1114   02/22/14 0600  ceFAZolin (ANCEF) IVPB 2 g/50 mL premix  Status:  Discontinued     2 g 100 mL/hr over 30 Minutes Intravenous On call to O.R. 02/21/14 1407 02/22/14 1639       Patient was given sequential compression devices, early ambulation to prevent DVT.  Patient benefited maximally from hospital stay and there were no complications.    Recent vital signs: BP 122/61  Pulse 72  Temp(Src) 97.9 F (36.6 C) (Oral)  Resp 18  Ht 5\' 7"  (1.702 m)  Wt 117.028 kg (258 lb)  BMI 40.40 kg/m2  SpO2 99%    Discharge Medications:     Medication List    STOP taking these medications       naproxen 500 MG tablet  Commonly known as:  NAPROSYN      TAKE these medications       eletriptan 40 MG tablet  Commonly known as:  RELPAX  Take 40 mg by mouth as needed for migraine  or headache. One tablet by mouth at onset of headache. May repeat in 2 hours if headache persists or recurs.     loratadine-pseudoephedrine 5-120 MG per tablet  Commonly known as:  CLARITIN-D 12-hour  Take 1 tablet by mouth 2 (two) times daily as needed for allergies.        Diagnostic Studies: Dg Chest 2 View  02/21/2014   CLINICAL DATA:  Preop for lumbar spine surgery  EXAM: CHEST  2 VIEW  COMPARISON:  None.  FINDINGS: No active infiltrate or effusion is seen. Mediastinal and hilar contours appear normal. The heart is within upper limits of normal. No bony abnormality is seen.  IMPRESSION: No active cardiopulmonary disease.   Electronically Signed   By: Ivar Drape M.D.   On: 02/21/2014 17:29   Dg Si Joints  02/22/2014   CLINICAL DATA:  Sacroiliac joint fusion  EXAM: DG C-ARM 61-120 MIN; BILATERAL SACROILIAC JOINTS - 3+ VIEW  COMPARISON:  None  FINDINGS: Frontal and lateral views were obtained. There are 3 metallic devices fusing the right sacroiliac joint. These devices appear intact. Alignment is anatomic. No fracture or diastases apparent.  IMPRESSION: Fusion device is in the right sacroiliac joint with alignment anatomic.   Electronically Signed   By: Lowella Grip M.D.   On: 02/22/2014 12:42   Dg C-arm 1-60 Min  02/22/2014  CLINICAL DATA:  Sacroiliac joint fusion  EXAM: DG C-ARM 61-120 MIN; BILATERAL SACROILIAC JOINTS - 3+ VIEW  COMPARISON:  None  FINDINGS: Frontal and lateral views were obtained. There are 3 metallic devices fusing the right sacroiliac joint. These devices appear intact. Alignment is anatomic. No fracture or diastases apparent.  IMPRESSION: Fusion device is in the right sacroiliac joint with alignment anatomic.   Electronically Signed   By: Lowella Grip M.D.   On: 02/22/2014 12:42    Disposition: 01-Home or Self Care   POD #1 s/p right SI fusion, doing well with expected minimal right buttock pain   - encourage ambulation  - Percocet for pain, Valium for  muscle spasms  - d/c home later today  -F/U in office 2 weeks   Signed: Justice Britain 03/01/2014, 11:21 AM

## 2014-03-15 DIAGNOSIS — I82409 Acute embolism and thrombosis of unspecified deep veins of unspecified lower extremity: Secondary | ICD-10-CM

## 2014-03-15 HISTORY — DX: Acute embolism and thrombosis of unspecified deep veins of unspecified lower extremity: I82.409

## 2014-03-20 ENCOUNTER — Other Ambulatory Visit (HOSPITAL_COMMUNITY): Payer: Self-pay | Admitting: Orthopedic Surgery

## 2014-03-20 ENCOUNTER — Emergency Department (HOSPITAL_COMMUNITY)
Admission: EM | Admit: 2014-03-20 | Discharge: 2014-03-20 | Disposition: A | Discharge status: Home / Self Care | Payer: Worker's Compensation | Attending: Emergency Medicine | Admitting: Emergency Medicine

## 2014-03-20 ENCOUNTER — Ambulatory Visit (HOSPITAL_COMMUNITY)
Admission: RE | Admit: 2014-03-20 | Discharge: 2014-03-20 | Disposition: A | Discharge status: Home / Self Care | Payer: Worker's Compensation | Source: Ambulatory Visit | Attending: Orthopedic Surgery | Admitting: Orthopedic Surgery

## 2014-03-20 ENCOUNTER — Encounter (HOSPITAL_COMMUNITY): Payer: Self-pay | Admitting: Emergency Medicine

## 2014-03-20 DIAGNOSIS — I824Z1 Acute embolism and thrombosis of unspecified deep veins of right distal lower extremity: Secondary | ICD-10-CM | POA: Insufficient documentation

## 2014-03-20 DIAGNOSIS — M7989 Other specified soft tissue disorders: Secondary | ICD-10-CM | POA: Diagnosis present

## 2014-03-20 DIAGNOSIS — M79661 Pain in right lower leg: Secondary | ICD-10-CM | POA: Diagnosis present

## 2014-03-20 DIAGNOSIS — Z8632 Personal history of gestational diabetes: Secondary | ICD-10-CM | POA: Diagnosis not present

## 2014-03-20 DIAGNOSIS — G43909 Migraine, unspecified, not intractable, without status migrainosus: Secondary | ICD-10-CM | POA: Insufficient documentation

## 2014-03-20 DIAGNOSIS — I82401 Acute embolism and thrombosis of unspecified deep veins of right lower extremity: Secondary | ICD-10-CM | POA: Insufficient documentation

## 2014-03-20 DIAGNOSIS — M79604 Pain in right leg: Secondary | ICD-10-CM

## 2014-03-20 DIAGNOSIS — R748 Abnormal levels of other serum enzymes: Secondary | ICD-10-CM | POA: Insufficient documentation

## 2014-03-20 DIAGNOSIS — M62831 Muscle spasm of calf: Secondary | ICD-10-CM

## 2014-03-20 DIAGNOSIS — R609 Edema, unspecified: Secondary | ICD-10-CM

## 2014-03-20 DIAGNOSIS — M25571 Pain in right ankle and joints of right foot: Secondary | ICD-10-CM | POA: Diagnosis present

## 2014-03-20 DIAGNOSIS — Z9889 Other specified postprocedural states: Secondary | ICD-10-CM | POA: Diagnosis not present

## 2014-03-20 LAB — COMPREHENSIVE METABOLIC PANEL
ALBUMIN: 4 g/dL (ref 3.5–5.2)
ALK PHOS: 118 U/L — AB (ref 39–117)
ALT: 20 U/L (ref 0–35)
ANION GAP: 12 (ref 5–15)
AST: 23 U/L (ref 0–37)
BILIRUBIN TOTAL: 0.3 mg/dL (ref 0.3–1.2)
BUN: 11 mg/dL (ref 6–23)
CO2: 25 mEq/L (ref 19–32)
CREATININE: 0.64 mg/dL (ref 0.50–1.10)
Calcium: 9.3 mg/dL (ref 8.4–10.5)
Chloride: 103 mEq/L (ref 96–112)
GFR calc Af Amer: >90 mL/min (ref >90)
GFR calc non Af Amer: >90 mL/min (ref >90)
GLUCOSE: 111 mg/dL — AB (ref 70–99)
Potassium: 4.2 mEq/L (ref 3.7–5.3)
Sodium: 140 mEq/L (ref 137–147)
TOTAL PROTEIN: 7.7 g/dL (ref 6.0–8.3)

## 2014-03-20 LAB — CBC WITH DIFFERENTIAL/PLATELET
BASOS PCT: 0 % (ref 0–1)
Basophils Absolute: 0 10*3/uL (ref 0.0–0.1)
EOS ABS: 0.1 10*3/uL (ref 0.0–0.7)
Eosinophils Relative: 2 % (ref 0–5)
HEMATOCRIT: 41.6 % (ref 36.0–46.0)
HEMOGLOBIN: 14 g/dL (ref 12.0–15.0)
LYMPHS ABS: 2 10*3/uL (ref 0.7–4.0)
Lymphocytes Relative: 35 % (ref 12–46)
MCH: 30.4 pg (ref 26.0–34.0)
MCHC: 33.7 g/dL (ref 30.0–36.0)
MCV: 90.4 fL (ref 78.0–100.0)
MONO ABS: 0.5 10*3/uL (ref 0.1–1.0)
MONOS PCT: 8 % (ref 3–12)
Neutro Abs: 3.2 10*3/uL (ref 1.7–7.7)
Neutrophils Relative %: 55 % (ref 43–77)
Platelets: 204 10*3/uL (ref 150–400)
RBC: 4.6 MIL/uL (ref 3.87–5.11)
RDW: 13.7 % (ref 11.5–15.5)
WBC: 5.8 10*3/uL (ref 4.0–10.5)

## 2014-03-20 LAB — PROTIME-INR
INR: 0.92 (ref 0.00–1.49)
Prothrombin Time: 12.4 seconds (ref 11.6–15.2)

## 2014-03-20 MED ORDER — XARELTO VTE STARTER PACK 15 & 20 MG PO TBPK: 15.0000 mg | ORAL_TABLET | ORAL | Status: DC

## 2014-03-20 MED ORDER — ELETRIPTAN HYDROBROMIDE 40 MG PO TABS
40.0000 mg | ORAL_TABLET | ORAL | Status: DC | PRN
  Administered 2014-03-20: 40 mg via ORAL
  Filled 2014-03-20: qty 1

## 2014-03-20 MED ORDER — ONDANSETRON 4 MG PO TBDP
4.0000 mg | ORAL_TABLET | Freq: Once | ORAL | Status: AC
  Administered 2014-03-20: 4 mg via ORAL
  Filled 2014-03-20: qty 1

## 2014-03-20 MED ORDER — RIVAROXABAN 20 MG PO TABS: 20.0000 mg | ORAL_TABLET | Freq: Every day | ORAL | Status: DC

## 2014-03-20 MED ORDER — RIVAROXABAN 15 MG PO TABS
15.0000 mg | ORAL_TABLET | Freq: Two times a day (BID) | ORAL | Status: DC
Start: 2014-03-20 — End: 2014-03-20
  Administered 2014-03-20: 15 mg via ORAL
  Filled 2014-03-20: qty 1

## 2014-03-20 NOTE — ED Notes (Signed)
Lab at bedside, pt medicated for nausea

## 2014-03-20 NOTE — ED Notes (Signed)
Patient states she is getting a migraine because she hasnt eaten all day. She also states her lower back hurts when she moves certain ways. She sayes that also is in pain because she hasnt taken any pain med all day but also states when she takes her percocet she gets nauseated

## 2014-03-20 NOTE — ED Notes (Signed)
Pt sent here for eval of DVT in right leg; pt had recent back sx

## 2014-03-20 NOTE — ED Notes (Signed)
Pt med received from pharmacy

## 2014-03-20 NOTE — ED Notes (Signed)
Called pharmacy to check on delay in getting pt medication for migraine that was requested at 1601

## 2014-03-20 NOTE — Discharge Instructions (Signed)

## 2014-03-20 NOTE — ED Notes (Signed)
PT GIVEN MORE SODA AND GRAHAM CRACKERS WITH PEANUT BUTTER

## 2014-03-20 NOTE — ED Provider Notes (Signed)
CSN: [id number]     Arrival date & time 03/20/14  1307 History   First MD Initiated Contact with Patient 03/20/14 1521     Chief Complaint  Patient presents with  . DVT     (Consider location/radiation/quality/duration/timing/severity/associated sxs/prior Treatment) HPI  Patient to the ER with complaints of DVT to right leg sent from vascular. She had back surgery a couple of weeks ago and has been taking Claritin, Hydrocodone, and migraine medications. She got a DVT study because of right ankle pain that started acutely a few days ago with swelling. She denies having CP, SOB, upper back pain. Denies weakness, fevers, nausea, diarrhea, fevers. No hx of blood clotting disorders. Not previously on blood thinners in past.  Past Medical History  Diagnosis Date  . PONV (postoperative nausea and vomiting)   . Gestational diabetes   . Migraines    Past Surgical History  Procedure Laterality Date  . Endometrial ablation    . Colonoscopy w/ polypectomy    . Plantar fascia release Right   . Tonsillectomy    . Eye surgery      to fix cross-eye as child  . Sacroiliac joint fusion Right 02/22/2014    Procedure: SACROILIAC JOINT FUSION;  Surgeon: Sinclair Ship, MD;  Location: [facility];  Service: Orthopedics;  Laterality: Right;  Right sided sacroiliac joint fusion   History reviewed. No pertinent family history. History  Substance Use Topics  . Smoking status: Never Smoker   . Smokeless tobacco: Not on file  . Alcohol Use: No   OB History   Grav Para Term Preterm Abortions TAB SAB Ect Mult Living                 Review of Systems  All other systems reviewed and are negative.     Allergies  Morphine and related and Sulfa antibiotics  Home Medications   Prior to Admission medications   Medication Sig Start Date End Date Taking? Authorizing Provider  eletriptan (RELPAX) 40 MG tablet Take 40 mg by mouth as needed for migraine or headache. One tablet by mouth at onset of  headache. May repeat in 2 hours if headache persists or recurs.    Historical Provider, MD  loratadine-pseudoephedrine (CLARITIN-D 12-HOUR) 5-120 MG per tablet Take 1 tablet by mouth 2 (two) times daily as needed for allergies.    Historical Provider, MD  XARELTO STARTER PACK 15 & 20 MG TBPK Take 15-20 mg by mouth as directed. Take as directed on package: Start with one 15mg  tablet by mouth twice a day with food. On Day 22, switch to one 20mg  tablet once a day with food. 03/20/14   Kendre Sires Marilu Favre, PA-C   BP 152/135  Pulse 99  Temp(Src) 97.9 F (36.6 C) (Oral)  Resp 18  Ht 5' 6.93" (1.7 m)  Wt 257 lb 15 oz (117 kg)  BMI 40.48 kg/m2  SpO2 97% Physical Exam  Nursing note and vitals reviewed. Constitutional: She appears well-developed and well-nourished. No distress.  HENT:  Head: Normocephalic and atraumatic.  Eyes: Pupils are equal, round, and reactive to light.  Neck: Normal range of motion. Neck supple.  Cardiovascular: Normal rate and regular rhythm.   Pulmonary/Chest: Effort normal. No respiratory distress. She has no wheezes. She has no rales. She exhibits no tenderness.  Abdominal: Soft.  Musculoskeletal:  Asymmetrical swelling of right leg.  Pedal pulse are symmetrical. No pitting edema  Neurological: She is alert.  Skin: Skin is warm and dry.  ED Course  Procedures (including critical care time) Labs Review Labs Reviewed  CBC WITH DIFFERENTIAL  PROTIME-INR  COMPREHENSIVE METABOLIC PANEL    Imaging Review No results found.   EKG Interpretation None      MDM   Final diagnoses:  DVT (deep venous thrombosis), right    Patient to the ER with positive doppler study. She has no respiratory symptoms or chest pains. No exertional angina. Pharmacy came to discuss medications with patient and administered first dose. Dr. Aline Brochure made aware or patients results and treatment.  Medications  eletriptan (RELPAX) tablet 40 mg (40 mg Oral Given 03/20/14 1658)   Rivaroxaban (XARELTO) tablet 15 mg (15 mg Oral Given 03/20/14 1647)  rivaroxaban (XARELTO) tablet 20 mg (not administered)  ondansetron (ZOFRAN-ODT) disintegrating tablet 4 mg (4 mg Oral Given 03/20/14 1605)   Patient declines wanting any pain medications here. She did eat and drink.   XARELTO STARTER PACK 15 & 20 MG TBPK Take 15-20 mg by mouth as directed. Take as directed on package: Start with one 15mg  tablet by mouth twice a day with food. On Day 22, switch to one 20mg  tablet once a day with food. 51 each Linus Mako, PA-C   *PRELIMINARY RESULTS*  Vascular Ultrasound  Right lower extremity venous duplex has been completed. Preliminary findings: Evidence of DVT involving the right peroneal veins.  Called results to Dr. Laurena Bering office and spoke with Janett Billow. She will contact PA.  Pricilla Holm, PA returned call. Instructed to take patient to ED for treatment.  Landry Mellow, RDMS, RVT  03/20/2014, 1:00 PM    She is to follow-up with her PCP regarding Xarelto. Risks of blood thinners explained. Will need evaluation if head injury occurs. If she develops CP, SOB, fatigue return to the ED. Made aware that usually Xarelto is long term treatment to resolved DVTs. Pt voices her understanding.  53 y.o.Teresa Aguilar's evaluation in the Emergency Department is complete. It has been determined that no acute conditions requiring further emergency intervention are present at this time. The patient/guardian have been advised of the diagnosis and plan. We have discussed signs and symptoms that warrant return to the ED, such as changes or worsening in symptoms.  Vital signs are stable at discharge. Filed Vitals:   03/20/14 1615  BP: 152/135  Pulse: 99  Temp:   Resp:     Patient/guardian has voiced understanding and agreed to follow-up with the PCP or specialist.   Linus Mako, PA-C 03/20/14 1703

## 2014-03-20 NOTE — Progress Notes (Signed)
ANTICOAGULATION CONSULT NOTE - Initial Consult  Pharmacy Consult for rivaroxaban Indication: DVT  Allergies  Allergen Reactions  . Morphine And Related Nausea And Vomiting  . Sulfa Antibiotics Hives   Vital Signs: Temp: 97.9 F (36.6 C) (10/06 1316) Temp Source: Oral (10/06 1316) BP: 143/89 mmHg (10/06 1545) Pulse Rate: 94 (10/06 1545)  Labs: No results found for this basename: HGB, HCT, PLT, APTT, LABPROT, INR, HEPARINUNFRC, CREATININE, CKTOTAL, CKMB, TROPONINI,  in the last 72 hours  The CrCl is unknown because both a height and weight (above a minimum accepted value) are required for this calculation.   Medical History: Past Medical History  Diagnosis Date  . PONV (postoperative nausea and vomiting)   . Gestational diabetes   . Migraines     Assessment: 53 y/o F sent here for eval of DVT in right leg and hx of recent back surgery.  Venous duplex shows evidence of DVT. CrCl ~ 140.  Goal of Therapy:  Monitor platelets    Plan:  Rivaroxaban 15 mg BID w/ food x 21 days, then Rivaroxaban 20 mg once daily w/ food.   Carl Best 03/20/2014,4:09 PM  Agree with the above. Will give patient Xarelto education kit as well.  Nena Jordan, PharmD, BCPS 03/20/2014, 4:19 PM

## 2014-03-20 NOTE — Progress Notes (Signed)
*  PRELIMINARY RESULTS* Vascular Ultrasound Right lower extremity venous duplex has been completed.  Preliminary findings: Evidence of DVT involving the right peroneal veins.  Called results to Dr. Laurena Bering office and spoke with Janett Billow. She will contact PA. Pricilla Holm, PA returned call. Instructed to take patient to ED for treatment.   Landry Mellow, RDMS, RVT  03/20/2014, 1:00 PM

## 2014-03-21 NOTE — ED Provider Notes (Signed)
Medical screening examination/treatment/procedure(s) were performed by non-physician practitioner and as supervising physician I was immediately available for consultation/collaboration.   EKG Interpretation None        Pamella Pert, MD 03/21/14 1149

## 2014-06-15 DIAGNOSIS — C50919 Malignant neoplasm of unspecified site of unspecified female breast: Secondary | ICD-10-CM

## 2014-06-15 HISTORY — DX: Malignant neoplasm of unspecified site of unspecified female breast: C50.919

## 2014-06-25 ENCOUNTER — Other Ambulatory Visit: Payer: Self-pay | Admitting: Obstetrics and Gynecology

## 2014-06-26 LAB — CYTOLOGY - PAP

## 2014-06-27 ENCOUNTER — Other Ambulatory Visit: Payer: Self-pay | Admitting: Obstetrics and Gynecology

## 2014-06-27 DIAGNOSIS — R928 Other abnormal and inconclusive findings on diagnostic imaging of breast: Secondary | ICD-10-CM

## 2014-07-09 ENCOUNTER — Other Ambulatory Visit: Payer: Self-pay

## 2014-07-11 ENCOUNTER — Other Ambulatory Visit: Payer: Self-pay

## 2014-07-12 ENCOUNTER — Ambulatory Visit
Admission: RE | Admit: 2014-07-12 | Discharge: 2014-07-12 | Disposition: A | Discharge status: Home / Self Care | Payer: BC Managed Care – PPO | Source: Ambulatory Visit | Attending: Obstetrics and Gynecology | Admitting: Obstetrics and Gynecology

## 2014-07-12 ENCOUNTER — Other Ambulatory Visit: Payer: Self-pay | Admitting: Obstetrics and Gynecology

## 2014-07-12 DIAGNOSIS — R928 Other abnormal and inconclusive findings on diagnostic imaging of breast: Secondary | ICD-10-CM

## 2014-07-19 ENCOUNTER — Other Ambulatory Visit: Payer: Self-pay | Admitting: Obstetrics and Gynecology

## 2014-07-19 DIAGNOSIS — R928 Other abnormal and inconclusive findings on diagnostic imaging of breast: Secondary | ICD-10-CM

## 2014-07-24 ENCOUNTER — Ambulatory Visit
Admission: RE | Admit: 2014-07-24 | Discharge: 2014-07-24 | Disposition: A | Discharge status: Home / Self Care | Payer: BC Managed Care – PPO | Source: Ambulatory Visit | Attending: Obstetrics and Gynecology | Admitting: Obstetrics and Gynecology

## 2014-07-24 DIAGNOSIS — R928 Other abnormal and inconclusive findings on diagnostic imaging of breast: Secondary | ICD-10-CM

## 2014-07-24 HISTORY — PX: BREAST BIOPSY: SHX20

## 2014-08-03 ENCOUNTER — Other Ambulatory Visit (INDEPENDENT_AMBULATORY_CARE_PROVIDER_SITE_OTHER): Payer: Self-pay | Admitting: General Surgery

## 2014-08-03 DIAGNOSIS — C50311 Malignant neoplasm of lower-inner quadrant of right female breast: Secondary | ICD-10-CM | POA: Insufficient documentation

## 2014-08-06 ENCOUNTER — Encounter: Payer: Self-pay | Admitting: *Deleted

## 2014-08-06 ENCOUNTER — Encounter: Payer: Self-pay | Admitting: Radiation Oncology

## 2014-08-06 NOTE — Progress Notes (Signed)
Location of Breast Cancer: right breast lower inner quadrant  Histology per Pathology Report:   07/24/14 Diagnosis Breast, right, needle core biopsy, mass, LIQ, 3:30 12 cmfn - INVASIVE DUCTAL CARCINOMA. - LYMPHOVASCULAR INVASION IS IDENTIFIED. - SEE COMMENT.  Receptor Status: ER(98%+), PR (76%+), Her2-neu (negative)  Did patient present with symptoms (if so, please note symptoms) or was this found on screening mammography?: screening mammography  Past/Anticipated interventions by surgeon, if any: surgery planned with Dr. Donne Hazel on 08/27/14  Past/Anticipated interventions by medical oncology, if any: has an apt with Dr. Jana Hakim on 09/04/14  Lymphedema issues, if any:  no  Pain issues, if any: has lower back pain from an injury at work.   OB GYN history: Age at menarche: 64 years, menopause [age] years, used bcp for 1 year, gravida 5, para 2, maternal age 82-25  SAFETY ISSUES:  Prior radiation? no  Pacemaker/ICD? no  Possible current pregnancy?no  Is the patient on methotrexate? no  Current Complaints / other details: Patient had surgery 02/22/14 -: [place] and then had blood clots in her right leg after surgery.  She is taking xarelto.  She has two sons (61 and 33), and one granddaughter.  She is a Technical sales engineer.  BP 146/92 mmHg  Pulse 98  Temp(Src) 98.2 F (36.8 C) (Oral)  Resp 16  Ht '5\' 7"'  (1.702 m)  Wt 277 lb 8 oz (125.873 kg)  BMI 43.45 kg/m2

## 2014-08-06 NOTE — Progress Notes (Signed)
Completed chart, labs entered, added to spreadsheet & placed in Dr. [name] box.

## 2014-08-07 ENCOUNTER — Other Ambulatory Visit (INDEPENDENT_AMBULATORY_CARE_PROVIDER_SITE_OTHER): Payer: Self-pay | Admitting: General Surgery

## 2014-08-07 DIAGNOSIS — C50311 Malignant neoplasm of lower-inner quadrant of right female breast: Secondary | ICD-10-CM

## 2014-08-09 ENCOUNTER — Ambulatory Visit
Admission: RE | Admit: 2014-08-09 | Discharge: 2014-08-09 | Disposition: A | Discharge status: Home / Self Care | Payer: BC Managed Care – PPO | Source: Ambulatory Visit | Attending: Radiation Oncology | Admitting: Radiation Oncology

## 2014-08-09 ENCOUNTER — Encounter: Payer: Self-pay | Admitting: Radiation Oncology

## 2014-08-09 ENCOUNTER — Encounter: Payer: Self-pay | Admitting: *Deleted

## 2014-08-09 VITALS — BP 146/92 | HR 98 | Temp 98.2°F | Resp 16 | Ht 67.0 in | Wt 277.5 lb

## 2014-08-09 DIAGNOSIS — Z803 Family history of malignant neoplasm of breast: Secondary | ICD-10-CM | POA: Insufficient documentation

## 2014-08-09 DIAGNOSIS — Z170 Estrogen receptor positive status [ER+]: Secondary | ICD-10-CM | POA: Insufficient documentation

## 2014-08-09 DIAGNOSIS — G43909 Migraine, unspecified, not intractable, without status migrainosus: Secondary | ICD-10-CM | POA: Diagnosis not present

## 2014-08-09 DIAGNOSIS — Z9889 Other specified postprocedural states: Secondary | ICD-10-CM | POA: Insufficient documentation

## 2014-08-09 DIAGNOSIS — C50311 Malignant neoplasm of lower-inner quadrant of right female breast: Secondary | ICD-10-CM | POA: Diagnosis not present

## 2014-08-09 HISTORY — DX: Malignant neoplasm of lower-inner quadrant of right female breast: C50.311

## 2014-08-09 HISTORY — DX: Hypoxemia: R09.02

## 2014-08-09 NOTE — Progress Notes (Signed)
Met with pt after new pt appt with Dr. Sondra Come. Discussed care plan summary and gave pt a copy. Confirmed surgery date and future appt with Dr. Jana Hakim. Gave navigation resources and contact information.  Pt denies questions or concerns regarding dx or treatment care plan. Encourage pt to call with needs. Received verbal understanding.

## 2014-08-09 NOTE — Progress Notes (Signed)
Please see the Nurse Progress Note in the MD Initial Consult Encounter for this patient. 

## 2014-08-09 NOTE — Progress Notes (Signed)
I briefly met with Ms. Gibas during her Radiation Oncology consultation today with Dr. Kinard. We discussed the purpose of the Survivorship Clinic, which will include monitoring for recurrence, coordinating completion of age and gender-appropriate cancer screenings, promotion of overall wellness, as well as managing potential late/long-term side effects of anti-cancer treatments.    As of today, the intent of treatment for Ms. Dempsey is cure. Therefore she will be eligible for the Survivorship Clinic upon her completion of treatment.  Her survivorship care plan (SCP) document will be drafted and updated throughout the course of her treatment trajectory. She will receive the SCP in an office visit with myself in the Survivorship Clinic once she has completed treatment.   Ms. Utke was encouraged to ask questions and all questions were answered to her satisfaction.  She was given my business card and encouraged to contact me with any concerns regarding survivorship.  I look forward to participating in her care.   Gretchen Dawson, NP Survivorship Program [facility] [phone] 

## 2014-08-09 NOTE — Progress Notes (Signed)
Radiation Oncology         [phone] ________________________________  Initial Outpatient Consultation  Name: Teresa Aguilar MRN: [id number]  Date: 08/09/2014  DOB: 11-15-1960  EX:BMWUXLK,GMWNU A, MD  [name], MD   REFERRING PHYSICIAN: [name], MD  DIAGNOSIS: Invasive ductal carcinoma the right breast, lower inner quadrant clinical stage I (T1c, N0)  HISTORY OF PRESENT ILLNESS::Teresa Aguilar is a 54 y.o. female who is seen out courtesy of Dr. Donne Hazel for an opinion concerning radiation therapy as part of management of patient's recently diagnosed right breast cancer. Recently the patient underwent screening mammography. She was noted to have a suspicious area in the lower inner quadrant of the right breast. Additional imaging confirmed a lesion approximately 12 cm from the nipple area in the lower inner quadrant. Patient proceeded to undergo biopsy of this area which revealed invasive ductal carcinoma grade 2 or 3. There was lymphovascular space invasion noted. The tumor was estrogen receptor positive at 98% and progesterone receptor +76%. Proliferation marker was elevated at 66%. There is no HER-2/neu amplification by fish.  the patient was seen by Dr. Donne Hazel and felt to be a good candidate for breast conservation therapy. The patient is now seen in radiation oncology for evaluation concerning this issue. Her her surgery is scheduled for early March.Marland Kitchen  PREVIOUS RADIATION THERAPY: No  PAST MEDICAL HISTORY:  has a past medical history of PONV (postoperative nausea and vomiting); Gestational diabetes; Migraines; Breast cancer of lower-inner quadrant of right female breast; and Hypoxia.    PAST SURGICAL HISTORY: Past Surgical History  Procedure Laterality Date  . Endometrial ablation    . Colonoscopy w/ polypectomy      found tubulovillous adenoma with focal high grade displasia  . Plantar fascia release Right   . Tonsillectomy    . Eye surgery     to fix cross-eye as child  . Sacroiliac joint fusion Right 02/22/2014    Procedure: SACROILIAC JOINT FUSION;  Surgeon: Sinclair Ship, MD;  Location: [facility];  Service: Orthopedics;  Laterality: Right;  Right sided sacroiliac joint fusion  . Ablation      x 2 to nerves in her lower back  . Dental implants      FAMILY HISTORY: family history includes Breast cancer in her cousin, maternal aunt, and paternal aunt.  SOCIAL HISTORY:  reports that she has never smoked. She does not have any smokeless tobacco history on file. She reports that she does not drink alcohol or use illicit drugs.  ALLERGIES: Morphine and related and Sulfa antibiotics  MEDICATIONS:  Current Outpatient Prescriptions  Medication Sig Dispense Refill  . eletriptan (RELPAX) 40 MG tablet Take 40 mg by mouth as needed for migraine or headache. One tablet by mouth at onset of headache. May repeat in 2 hours if headache persists or recurs.    Marland Kitchen loratadine-pseudoephedrine (CLARITIN-D 12-HOUR) 5-120 MG per tablet Take 1 tablet by mouth 2 (two) times daily as needed for allergies.    Alveda Reasons 20 MG TABS tablet   5   No current facility-administered medications for this encounter.    REVIEW OF SYSTEMS:  A 15 point review of systems is documented in the electronic medical record. This was obtained by the nursing staff. However, I reviewed this with the patient to discuss relevant findings and make appropriate changes.  Prior to biopsy the patient denied any pain in the breast area nipple discharge or bleeding. She denies any problems with swelling in her right arm  or hand. She denies any new bony pain headaches dizziness or blurred vision. She has pain in the right pelvis and has undergone recent SI joint fusion. The patient is unable to work in light of her hip issues   PHYSICAL EXAM:  height is _0  (1.702 m) and weight is 277 lb 8 oz (125.873 kg). Her oral temperature is 98.2 F (36.8 C). Her blood pressure is 146/92 and her  pulse is 98. Her respiration is 16.   BP 146/92 mmHg  Pulse 98  Temp(Src) 98.2 F (36.8 C) (Oral)  Resp 16  Ht _1  (1.702 m)  Wt 277 lb 8 oz (125.873 kg)  BMI 43.45 kg/m2  General Appearance:    Alert, cooperative, no distress, appears stated age  Head:    Normocephalic, without obvious abnormality, atraumatic  Eyes:    PERRL, conjunctiva/corneas clear, EOM's intact,        Nose:   Nares normal, septum midline, mucosa normal, no drainage    or sinus tenderness  Throat:   Lips, mucosa, and tongue normal; gums normal  Neck:   Supple, symmetrical, trachea midline, no adenopathy;    thyroid:  no enlargement/tenderness/nodules; no carotid   bruit or JVD  Back:     Symmetric, no curvature, ROM normal, no CVA tenderness  Lungs:     Clear to auscultation bilaterally, respirations unlabored  Chest Wall:    No tenderness or deformity   Heart:    Regular rate and rhythm, S1 and S2 normal, no murmur, rub   or gallop  Breast Exam:    Left breast, No tenderness, masses, or nipple abnormality, large and pendulous; the right breast exam shows no palpable mass or nipple discharge or bleeding. The right breast is also large and pendulous. There is a biopsy site in the lower inner quadrant approximately 10 cm from the nipple area. This is located at approximately the 3:30 to 4:00 position of the breast   Abdomen:     Soft, non-tender, bowel sounds active all four quadrants,    no masses, no organomegaly        Extremities:   Extremities normal, atraumatic, no cyanosis or edema  Pulses:   2+ and symmetric all extremities  Skin:   Skin color, texture, turgor normal, no rashes or lesions  Lymph nodes:   Cervical, supraclavicular, and axillary nodes normal  Neurologic:    normal strength, sensation and reflexes    throughout     ECOG = 1    1 - Symptomatic but completely ambulatory (Restricted in physically strenuous activity but ambulatory and able to carry out work of a light or sedentary  nature. For example, light housework, office work) related to hip issues  LABORATORY DATA:  Lab Results  Component Value Date   WBC 5.8 03/20/2014   HGB 14.0 03/20/2014   HCT 41.6 03/20/2014   MCV 90.4 03/20/2014   PLT 204 03/20/2014   NEUTROABS 3.2 03/20/2014   Lab Results  Component Value Date   NA 140 03/20/2014   K 4.2 03/20/2014   CL 103 03/20/2014   CO2 25 03/20/2014   GLUCOSE 111* 03/20/2014   CREATININE 0.64 03/20/2014   CALCIUM 9.3 03/20/2014      RADIOGRAPHY: Mm Digital Diagnostic Unilat R  07/24/2014   CLINICAL DATA:  Ultrasound-guided core needle biopsy of a suspicious 1.5 cm mass in the lower inner quadrant of the right breast earlier today. Confirmation of clip placement.  EXAM: DIAGNOSTIC RIGHT MAMMOGRAM POST ULTRASOUND  BIOPSY  COMPARISON:  Previous exam(s).  FINDINGS: Mammographic images were obtained following ultrasound guided biopsy of a 1.5 cm mass in the lower inner quadrant of the right breast. The ribbon shaped tissue marker clip is appropriately positioned within the mass. Expected post biopsy changes are present without evidence of hematoma  IMPRESSION: Appropriate positioning of the ribbon shaped tissue marker clip within the mass in the lower inner quadrant of the right breast.  Final Assessment: Post Procedure Mammograms for Marker Placement   Electronically Signed   By: Evangeline Dakin M.D.   On: 07/24/2014 09:49   Mm Digital Diagnostic Unilat R  07/12/2014   CLINICAL DATA:  Patient recalled from screening for right breast mass.  EXAM: DIGITAL DIAGNOSTIC  RIGHT MAMMOGRAM  ULTRASOUND RIGHT BREAST  COMPARISON:  [facility]  ACR Breast Density Category a: The breast tissue is almost entirely fatty.  FINDINGS: Within the lower inner right breast posterior depth there is an irregular 2 cm mass further evaluated with spot compression CC and MLO views.  On physical exam, I palpate a small firm mass within the lower inner right breast.  Ultrasound is performed, showing an  11 x 11 x 15 mm irregular taller than wide hypoechoic mass within the right breast 330 o'clock 12 cm from the nipple. There is a surrounding peripheral rim of increased echogenicity. No right axillary lymphadenopathy.  IMPRESSION: Suspicious right breast mass.  RECOMMENDATION: Ultrasound-guided core needle biopsy right breast mass.  This is scheduled for 07/24/2014 at 8 a.m.  I have discussed the findings and recommendations with the patient. Results were also provided in writing at the conclusion of the visit. If applicable, a reminder letter will be sent to the patient regarding the next appointment.  BI-RADS CATEGORY  5: Highly suggestive of malignancy.   Electronically Signed   By: Lovey Newcomer M.D.   On: 07/12/2014 11:53   US Breast Ltd Uni Right Inc Axilla  07/12/2014   CLINICAL DATA:  Patient recalled from screening for right breast mass.  EXAM: DIGITAL DIAGNOSTIC  RIGHT MAMMOGRAM  ULTRASOUND RIGHT BREAST  COMPARISON:  [facility]  ACR Breast Density Category a: The breast tissue is almost entirely fatty.  FINDINGS: Within the lower inner right breast posterior depth there is an irregular 2 cm mass further evaluated with spot compression CC and MLO views.  On physical exam, I palpate a small firm mass within the lower inner right breast.  Ultrasound is performed, showing an 11 x 11 x 15 mm irregular taller than wide hypoechoic mass within the right breast 330 o'clock 12 cm from the nipple. There is a surrounding peripheral rim of increased echogenicity. No right axillary lymphadenopathy.  IMPRESSION: Suspicious right breast mass.  RECOMMENDATION: Ultrasound-guided core needle biopsy right breast mass.  This is scheduled for 07/24/2014 at 8 a.m.  I have discussed the findings and recommendations with the patient. Results were also provided in writing at the conclusion of the visit. If applicable, a reminder letter will be sent to the patient regarding the next appointment.  BI-RADS CATEGORY  5: Highly suggestive  of malignancy.   Electronically Signed   By: Lovey Newcomer M.D.   On: 07/12/2014 11:53   [place] Rt Breast Bx W Loc Dev 1st Lesion Img Bx Spec US Guide  07/25/2014   ADDENDUM REPORT: 07/25/2014 10:25  ADDENDUM: Pathology revealed grade II to III invasive ductal carcinoma with lymphovascular invasion in the right breast. This was found to be concordant by Dr. Conchita Paris. Pathology was  discussed with the patient by telephone. She reported doing well after the biopsy. Post biopsy instructions and care were reviewed and her questions were answered. The patient requested surgical consultation with Dr. [name] at [facility] and is scheduled on August 10, 2014. She was encouraged to come to [facility] for educational materials. My number was provided to the patient for future questions and concerns.  Pathology results reported by Susa Raring RN, BSN on July 25, 2014.   Electronically Signed   By: Evangeline Dakin M.D.   On: 07/25/2014 10:25   07/25/2014   CLINICAL DATA:  Screening detected suspicious 1.5 cm mass in the lower inner quadrant of the right breast on recent diagnostic workup.  EXAM: ULTRASOUND GUIDED RIGHT BREAST CORE NEEDLE BIOPSY  COMPARISON:  Previous exam(s).  FINDINGS: I met with the patient and we discussed the procedure of ultrasound-guided biopsy, including benefits and alternatives. We discussed the high likelihood of a successful procedure. We discussed the risks of the procedure, including infection, bleeding, tissue injury, clip migration, and inadequate sampling. Informed written consent was given. The usual time-out protocol was performed immediately prior to the procedure.  Using sterile technique and 2% Lidocaine as local anesthetic, under direct ultrasound visualization, a 14 gauge Bard Marquee spring-loaded core needle device was used to perform biopsy of the suspicious mass in the lower inner quadrant of the right breast  using a superior approach. At the conclusion of the procedure a ribbon shaped tissue marker clip was deployed into the biopsy cavity. Follow up 2 view mammogram was performed and dictated separately.  IMPRESSION: Ultrasound guided biopsy of a 1.5 cm mass in the lower inner quadrant of the right breast. No apparent complications.  Electronically Signed: By: Evangeline Dakin M.D. On: 07/24/2014 09:47      IMPRESSION: Clinical stage I invasive ductal carcinoma of the right breast ( T1c, N0). The patient would appear to be a good candidate for breast conservation therapy with partial mastectomy, sentinel node procedure and radiation therapy directed at the right breast. I discussed the treatment course side effects and potential toxicities of radiation therapy in this situation with the patient. She appears to understand and wishes to proceed with treatment  PLAN: Surgery next month as above. The patient will be seen in the postoperative setting along with medical oncology for further treatment planning and evaluation   I spent 60 minutes minutes face to face with the patient and more than 50% of that time was spent in counseling and/or coordination of care.   ------------------------------------------------  Blair Promise, PhD, MD

## 2014-08-20 ENCOUNTER — Encounter (HOSPITAL_COMMUNITY): Payer: Self-pay

## 2014-08-20 ENCOUNTER — Encounter (HOSPITAL_COMMUNITY)
Admission: RE | Admit: 2014-08-20 | Discharge: 2014-08-20 | Disposition: A | Discharge status: Home / Self Care | Payer: BC Managed Care – PPO | Source: Ambulatory Visit | Attending: General Surgery | Admitting: General Surgery

## 2014-08-20 DIAGNOSIS — Z882 Allergy status to sulfonamides status: Secondary | ICD-10-CM | POA: Diagnosis not present

## 2014-08-20 DIAGNOSIS — C50919 Malignant neoplasm of unspecified site of unspecified female breast: Secondary | ICD-10-CM | POA: Diagnosis present

## 2014-08-20 DIAGNOSIS — E119 Type 2 diabetes mellitus without complications: Secondary | ICD-10-CM | POA: Diagnosis not present

## 2014-08-20 DIAGNOSIS — Z886 Allergy status to analgesic agent status: Secondary | ICD-10-CM | POA: Diagnosis not present

## 2014-08-20 DIAGNOSIS — D0511 Intraductal carcinoma in situ of right breast: Secondary | ICD-10-CM | POA: Diagnosis not present

## 2014-08-20 DIAGNOSIS — I10 Essential (primary) hypertension: Secondary | ICD-10-CM | POA: Diagnosis not present

## 2014-08-20 DIAGNOSIS — Z86010 Personal history of colonic polyps: Secondary | ICD-10-CM | POA: Diagnosis not present

## 2014-08-20 DIAGNOSIS — Z7901 Long term (current) use of anticoagulants: Secondary | ICD-10-CM | POA: Diagnosis not present

## 2014-08-20 HISTORY — DX: Personal history of other medical treatment: Z92.89

## 2014-08-20 HISTORY — DX: Anxiety disorder, unspecified: F41.9

## 2014-08-20 HISTORY — DX: Acute embolism and thrombosis of unspecified deep veins of unspecified lower extremity: I82.409

## 2014-08-20 HISTORY — DX: Family history of other specified conditions: Z84.89

## 2014-08-20 HISTORY — DX: Essential (primary) hypertension: I10

## 2014-08-20 HISTORY — DX: Unspecified osteoarthritis, unspecified site: M19.90

## 2014-08-20 LAB — CBC WITH DIFFERENTIAL/PLATELET
Basophils Absolute: 0 10*3/uL (ref 0.0–0.1)
Basophils Relative: 0 % (ref 0–1)
Eosinophils Absolute: 0.1 10*3/uL (ref 0.0–0.7)
Eosinophils Relative: 1 % (ref 0–5)
HCT: 43.2 % (ref 36.0–46.0)
Hemoglobin: 14.3 g/dL (ref 12.0–15.0)
Lymphocytes Relative: 31 % (ref 12–46)
Lymphs Abs: 2.6 10*3/uL (ref 0.7–4.0)
MCH: 30 pg (ref 26.0–34.0)
MCHC: 33.1 g/dL (ref 30.0–36.0)
MCV: 90.8 fL (ref 78.0–100.0)
Monocytes Absolute: 0.9 10*3/uL (ref 0.1–1.0)
Monocytes Relative: 10 % (ref 3–12)
NEUTROS ABS: 4.9 10*3/uL (ref 1.7–7.7)
Neutrophils Relative %: 58 % (ref 43–77)
PLATELETS: 231 10*3/uL (ref 150–400)
RBC: 4.76 MIL/uL (ref 3.87–5.11)
RDW: 14.6 % (ref 11.5–15.5)
WBC: 8.4 10*3/uL (ref 4.0–10.5)

## 2014-08-20 LAB — COMPREHENSIVE METABOLIC PANEL
ALBUMIN: 3.8 g/dL (ref 3.5–5.2)
ALT: 17 U/L (ref 0–35)
AST: 20 U/L (ref 0–37)
Alkaline Phosphatase: 91 U/L (ref 39–117)
Anion gap: 10 (ref 5–15)
BUN: 14 mg/dL (ref 6–23)
CALCIUM: 8.5 mg/dL (ref 8.4–10.5)
CO2: 22 mmol/L (ref 19–32)
Chloride: 107 mmol/L (ref 96–112)
Creatinine, Ser: 0.73 mg/dL (ref 0.50–1.10)
GFR calc Af Amer: >90 mL/min (ref >90)
GFR calc non Af Amer: >90 mL/min (ref >90)
Glucose, Bld: 109 mg/dL — ABNORMAL HIGH (ref 70–99)
Potassium: 3.9 mmol/L (ref 3.5–5.1)
Sodium: 139 mmol/L (ref 135–145)
TOTAL PROTEIN: 6.5 g/dL (ref 6.0–8.3)
Total Bilirubin: 0.7 mg/dL (ref 0.3–1.2)

## 2014-08-20 NOTE — Pre-Procedure Instructions (Signed)
Teresa Aguilar  08/20/2014   Your procedure is scheduled on:  08/27/2014  Report to [facility] Admitting at 5:30 AM.  Call this number if you have problems the morning of surgery: [phone]   Remember:   Do not eat food or drink liquids after midnight. On Sunday   Take these medicines the morning of surgery with A SIP OF WATER:  NOTHING   Do not wear jewelry, make-up or nail polish.   Do not wear lotions, powders, or perfumes. You may wear deodorant.   Do not shave 48 hours prior to surgery.    Do not bring valuables to the hospital.  [facility] is not responsible                  for any belongings or valuables.               Contacts, dentures or bridgework may not be worn into surgery.   Leave suitcase in the car. After surgery it may be brought to your room.   For patients admitted to the hospital, discharge time is determined by your                treatment team.               Patients discharged the day of surgery will not be allowed to drive  home.  Name and phone number of your driver: with spouse & other family members   Special Instructions: Special Instructions: [place] - Preparing for Surgery  Before surgery, you can play an important role.  Because skin is not sterile, your skin needs to be as free of germs as possible.  You can reduce the number of germs on you skin by washing with CHG (chlorahexidine gluconate) soap before surgery.  CHG is an antiseptic cleaner which kills germs and bonds with the skin to continue killing germs even after washing.  Please DO NOT use if you have an allergy to CHG or antibacterial soaps.  If your skin becomes reddened/irritated stop using the CHG and inform your nurse when you arrive at Short Stay.  Do not shave (including legs and underarms) for at least 48 hours prior to the first CHG shower.  You may shave your face.  Please follow these instructions carefully:   1.  Shower with CHG Soap the night before  surgery and the  morning of Surgery.  2.  If you choose to wash your hair, wash your hair first as usual with your  normal shampoo.  3.  After you shampoo, rinse your hair and body thoroughly to remove the  Shampoo.  4.  Use CHG as you would any other liquid soap.  You can apply chg directly to the skin and wash gently with scrungie or a clean washcloth.  5.  Apply the CHG Soap to your body ONLY FROM THE NECK DOWN.    Do not use on open wounds or open sores.  Avoid contact with your eyes, ears, mouth and genitals (private parts).  Wash genitals (private parts)   with your normal soap.  6.  Wash thoroughly, paying special attention to the area where your surgery will be performed.  7.  Thoroughly rinse your body with warm water from the neck down.  8.  DO NOT shower/wash with your normal soap after using and rinsing off   the CHG Soap.  9.  Pat yourself dry with a clean towel.  10.  Wear clean pajamas.            11.  Place clean sheets on your bed the night of your first shower and do not sleep with pets.  Day of Surgery  Do not apply any lotions/deodorants the morning of surgery.  Please wear clean clothes to the hospital/surgery center.   Please read over the following fact sheets that you were given: Pain Booklet, Coughing and Deep Breathing and Surgical Site Infection Prevention

## 2014-08-20 NOTE — Progress Notes (Signed)
Pt. Reports that she has been instructed to take the last dose of Xarelto on Thurs. 08/23/2014. On Frid. 3/11, she is scheduled for the radioseed implant. Pt. Denies any changes in her chest, breathing, etc. Pt. Had normal EKG & CXR in Sept. 2015, no need to repeat today.  Pt. With pain in her back & hip today  relative to surgery in 02/2014. Reports that she  Has had multiple injectons .

## 2014-08-24 ENCOUNTER — Ambulatory Visit
Admission: RE | Admit: 2014-08-24 | Discharge: 2014-08-24 | Disposition: A | Discharge status: Home / Self Care | Payer: BC Managed Care – PPO | Source: Ambulatory Visit | Attending: General Surgery | Admitting: General Surgery

## 2014-08-24 DIAGNOSIS — C50311 Malignant neoplasm of lower-inner quadrant of right female breast: Secondary | ICD-10-CM

## 2014-08-26 MED ORDER — DEXTROSE 5 % IV SOLN: 3.0000 g | INTRAVENOUS | Status: DC

## 2014-08-27 ENCOUNTER — Ambulatory Visit (HOSPITAL_COMMUNITY): Payer: BC Managed Care – PPO | Admitting: Certified Registered Nurse Anesthetist

## 2014-08-27 ENCOUNTER — Encounter (HOSPITAL_COMMUNITY): Payer: Self-pay | Admitting: Surgery

## 2014-08-27 ENCOUNTER — Observation Stay (HOSPITAL_BASED_OUTPATIENT_CLINIC_OR_DEPARTMENT_OTHER)
Admission: RE | Admit: 2014-08-27 | Discharge: 2014-08-28 | Disposition: A | Discharge status: Home / Self Care | Payer: BC Managed Care – PPO | Source: Ambulatory Visit | Attending: General Surgery | Admitting: General Surgery

## 2014-08-27 ENCOUNTER — Encounter (HOSPITAL_COMMUNITY)
Admission: RE | Disposition: A | Discharge status: Home / Self Care | Payer: Self-pay | Source: Ambulatory Visit | Attending: General Surgery

## 2014-08-27 ENCOUNTER — Ambulatory Visit
Admission: RE | Admit: 2014-08-27 | Discharge: 2014-08-27 | Disposition: A | Discharge status: Home / Self Care | Payer: BC Managed Care – PPO | Source: Ambulatory Visit | Attending: General Surgery | Admitting: General Surgery

## 2014-08-27 ENCOUNTER — Encounter (HOSPITAL_COMMUNITY)
Admission: RE | Admit: 2014-08-27 | Discharge: 2014-08-27 | Disposition: A | Discharge status: Home / Self Care | Payer: BC Managed Care – PPO | Source: Ambulatory Visit | Attending: General Surgery | Admitting: General Surgery

## 2014-08-27 DIAGNOSIS — C50311 Malignant neoplasm of lower-inner quadrant of right female breast: Secondary | ICD-10-CM

## 2014-08-27 DIAGNOSIS — Z886 Allergy status to analgesic agent status: Secondary | ICD-10-CM | POA: Insufficient documentation

## 2014-08-27 DIAGNOSIS — E119 Type 2 diabetes mellitus without complications: Secondary | ICD-10-CM | POA: Insufficient documentation

## 2014-08-27 DIAGNOSIS — I10 Essential (primary) hypertension: Secondary | ICD-10-CM | POA: Insufficient documentation

## 2014-08-27 DIAGNOSIS — D0511 Intraductal carcinoma in situ of right breast: Principal | ICD-10-CM | POA: Insufficient documentation

## 2014-08-27 DIAGNOSIS — Z86010 Personal history of colonic polyps: Secondary | ICD-10-CM | POA: Insufficient documentation

## 2014-08-27 DIAGNOSIS — Z7901 Long term (current) use of anticoagulants: Secondary | ICD-10-CM | POA: Insufficient documentation

## 2014-08-27 DIAGNOSIS — Z882 Allergy status to sulfonamides status: Secondary | ICD-10-CM | POA: Insufficient documentation

## 2014-08-27 DIAGNOSIS — C50919 Malignant neoplasm of unspecified site of unspecified female breast: Secondary | ICD-10-CM | POA: Diagnosis present

## 2014-08-27 HISTORY — DX: Other chronic pain: G89.29

## 2014-08-27 HISTORY — DX: Malignant neoplasm of colon, unspecified: C18.9

## 2014-08-27 HISTORY — PX: BREAST LUMPECTOMY: SHX2

## 2014-08-27 HISTORY — PX: AXILLARY SENTINEL NODE BIOPSY: SHX5738

## 2014-08-27 HISTORY — DX: Dorsalgia, unspecified: M54.9

## 2014-08-27 HISTORY — PX: RADIOACTIVE SEED GUIDED PARTIAL MASTECTOMY WITH AXILLARY SENTINEL LYMPH NODE BIOPSY: SHX6520

## 2014-08-27 SURGERY — RADIOACTIVE SEED GUIDED PARTIAL MASTECTOMY WITH AXILLARY SENTINEL LYMPH NODE BIOPSY
Anesthesia: General | Site: Breast | Laterality: Right

## 2014-08-27 MED ORDER — ACETAMINOPHEN 325 MG PO TABS: 650.0000 mg | ORAL_TABLET | Freq: Four times a day (QID) | ORAL | Status: DC | PRN

## 2014-08-27 MED ORDER — LIDOCAINE HCL (CARDIAC) 20 MG/ML IV SOLN
INTRAVENOUS | Status: AC
  Filled 2014-08-27: qty 10

## 2014-08-27 MED ORDER — OXYCODONE HCL 5 MG PO TABS
5.0000 mg | ORAL_TABLET | ORAL | Status: DC | PRN
  Administered 2014-08-27: 5 mg via ORAL
  Administered 2014-08-28: 10 mg via ORAL
  Filled 2014-08-27: qty 2
  Filled 2014-08-27: qty 1

## 2014-08-27 MED ORDER — NEOSTIGMINE METHYLSULFATE 10 MG/10ML IV SOLN
INTRAVENOUS | Status: AC
  Filled 2014-08-27: qty 1

## 2014-08-27 MED ORDER — METHYLENE BLUE 1 % INJ SOLN
INTRAMUSCULAR | Status: AC
  Filled 2014-08-27: qty 10

## 2014-08-27 MED ORDER — ONDANSETRON HCL 4 MG/2ML IJ SOLN
INTRAMUSCULAR | Status: DC | PRN
  Administered 2014-08-27: 4 mg via INTRAVENOUS

## 2014-08-27 MED ORDER — LIDOCAINE HCL (CARDIAC) 20 MG/ML IV SOLN
INTRAVENOUS | Status: DC | PRN
  Administered 2014-08-27: 70 mg via INTRAVENOUS

## 2014-08-27 MED ORDER — ARTIFICIAL TEARS OP OINT
TOPICAL_OINTMENT | OPHTHALMIC | Status: AC
  Filled 2014-08-27: qty 3.5

## 2014-08-27 MED ORDER — EPHEDRINE SULFATE 50 MG/ML IJ SOLN
INTRAMUSCULAR | Status: AC
  Filled 2014-08-27: qty 2

## 2014-08-27 MED ORDER — ROCURONIUM BROMIDE 50 MG/5ML IV SOLN
INTRAVENOUS | Status: AC
  Filled 2014-08-27: qty 1

## 2014-08-27 MED ORDER — SODIUM CHLORIDE 0.9 % IV SOLN: INTRAVENOUS | Status: DC

## 2014-08-27 MED ORDER — CEFAZOLIN SODIUM-DEXTROSE 2-3 GM-% IV SOLR
2.0000 g | Freq: Once | INTRAVENOUS | Status: AC
  Administered 2014-08-27: 2 g via INTRAVENOUS

## 2014-08-27 MED ORDER — SODIUM CHLORIDE 0.9 % IJ SOLN: 3.0000 mL | INTRAMUSCULAR | Status: DC | PRN

## 2014-08-27 MED ORDER — HYDROMORPHONE HCL 1 MG/ML IJ SOLN
INTRAMUSCULAR | Status: AC
  Filled 2014-08-27: qty 1

## 2014-08-27 MED ORDER — SODIUM CHLORIDE 0.9 % IJ SOLN
INTRAMUSCULAR | Status: AC
  Filled 2014-08-27: qty 10

## 2014-08-27 MED ORDER — ACETAMINOPHEN 650 MG RE SUPP: 650.0000 mg | RECTAL | Status: DC | PRN

## 2014-08-27 MED ORDER — DEXAMETHASONE SODIUM PHOSPHATE 4 MG/ML IJ SOLN
INTRAMUSCULAR | Status: DC | PRN
  Administered 2014-08-27: 4 mg via INTRAVENOUS

## 2014-08-27 MED ORDER — PROPOFOL 10 MG/ML IV BOLUS
INTRAVENOUS | Status: DC | PRN
  Administered 2014-08-27: 200 mg via INTRAVENOUS

## 2014-08-27 MED ORDER — ONDANSETRON HCL 4 MG/2ML IJ SOLN
INTRAMUSCULAR | Status: AC
  Filled 2014-08-27: qty 2

## 2014-08-27 MED ORDER — ONDANSETRON HCL 4 MG/2ML IJ SOLN: 4.0000 mg | Freq: Four times a day (QID) | INTRAMUSCULAR | Status: DC | PRN

## 2014-08-27 MED ORDER — SODIUM CHLORIDE 0.9 % IJ SOLN
INTRAMUSCULAR | Status: AC
  Filled 2014-08-27: qty 20

## 2014-08-27 MED ORDER — PROMETHAZINE HCL 25 MG/ML IJ SOLN: 6.2500 mg | INTRAMUSCULAR | Status: DC | PRN

## 2014-08-27 MED ORDER — LIDOCAINE HCL (CARDIAC) 20 MG/ML IV SOLN
INTRAVENOUS | Status: AC
  Filled 2014-08-27: qty 5

## 2014-08-27 MED ORDER — GLYCOPYRROLATE 0.2 MG/ML IJ SOLN
INTRAMUSCULAR | Status: AC
  Filled 2014-08-27: qty 3

## 2014-08-27 MED ORDER — PROPOFOL 10 MG/ML IV BOLUS
INTRAVENOUS | Status: AC
  Filled 2014-08-27: qty 20

## 2014-08-27 MED ORDER — ACETAMINOPHEN 650 MG RE SUPP: 650.0000 mg | Freq: Four times a day (QID) | RECTAL | Status: DC | PRN

## 2014-08-27 MED ORDER — BUPIVACAINE HCL (PF) 0.5 % IJ SOLN
INTRAMUSCULAR | Status: DC | PRN
  Administered 2014-08-27: 30 mL

## 2014-08-27 MED ORDER — FENTANYL CITRATE 0.05 MG/ML IJ SOLN
INTRAMUSCULAR | Status: DC | PRN
  Administered 2014-08-27 (×2): 50 ug via INTRAVENOUS
  Administered 2014-08-27: 100 ug via INTRAVENOUS
  Administered 2014-08-27: 50 ug via INTRAVENOUS

## 2014-08-27 MED ORDER — MIDAZOLAM HCL 2 MG/2ML IJ SOLN
INTRAMUSCULAR | Status: AC
  Filled 2014-08-27: qty 2

## 2014-08-27 MED ORDER — FENTANYL CITRATE 0.05 MG/ML IJ SOLN
INTRAMUSCULAR | Status: AC
  Filled 2014-08-27: qty 5

## 2014-08-27 MED ORDER — OXYCODONE-ACETAMINOPHEN 10-325 MG PO TABS: 1.0000 | ORAL_TABLET | Freq: Four times a day (QID) | ORAL | Status: DC | PRN

## 2014-08-27 MED ORDER — 0.9 % SODIUM CHLORIDE (POUR BTL) OPTIME
TOPICAL | Status: DC | PRN
  Administered 2014-08-27: 1000 mL

## 2014-08-27 MED ORDER — MIDAZOLAM HCL 5 MG/5ML IJ SOLN
INTRAMUSCULAR | Status: DC | PRN
  Administered 2014-08-27: 2 mg via INTRAVENOUS

## 2014-08-27 MED ORDER — SODIUM CHLORIDE 0.9 % IV SOLN: 250.0000 mL | INTRAVENOUS | Status: DC | PRN

## 2014-08-27 MED ORDER — HYDROMORPHONE HCL 1 MG/ML IJ SOLN: 0.5000 mg | INTRAMUSCULAR | Status: DC | PRN

## 2014-08-27 MED ORDER — SODIUM CHLORIDE 0.9 % IJ SOLN: 3.0000 mL | Freq: Two times a day (BID) | INTRAMUSCULAR | Status: DC

## 2014-08-27 MED ORDER — PHENOL 1.4 % MT LIQD
1.0000 | OROMUCOSAL | Status: DC | PRN
  Filled 2014-08-27: qty 177

## 2014-08-27 MED ORDER — HYDROMORPHONE HCL 1 MG/ML IJ SOLN
INTRAMUSCULAR | Status: AC
  Administered 2014-08-27: 0.5 mg via INTRAVENOUS
  Filled 2014-08-27: qty 1

## 2014-08-27 MED ORDER — TECHNETIUM TC 99M SULFUR COLLOID FILTERED: 1.0000 | Freq: Once | INTRAVENOUS | Status: AC | PRN

## 2014-08-27 MED ORDER — LACTATED RINGERS IV SOLN
INTRAVENOUS | Status: DC | PRN
  Administered 2014-08-27: 07:00:00 via INTRAVENOUS

## 2014-08-27 MED ORDER — EPHEDRINE SULFATE 50 MG/ML IJ SOLN
INTRAMUSCULAR | Status: AC
  Filled 2014-08-27: qty 1

## 2014-08-27 MED ORDER — HYDROMORPHONE HCL 1 MG/ML IJ SOLN
0.2500 mg | INTRAMUSCULAR | Status: DC | PRN
  Administered 2014-08-27 (×3): 0.5 mg via INTRAVENOUS

## 2014-08-27 MED ORDER — ACETAMINOPHEN 325 MG PO TABS: 650.0000 mg | ORAL_TABLET | ORAL | Status: DC | PRN

## 2014-08-27 MED ORDER — SUCCINYLCHOLINE CHLORIDE 20 MG/ML IJ SOLN
INTRAMUSCULAR | Status: AC
  Filled 2014-08-27: qty 1

## 2014-08-27 MED ORDER — BUPIVACAINE-EPINEPHRINE (PF) 0.25% -1:200000 IJ SOLN
INTRAMUSCULAR | Status: AC
  Filled 2014-08-27: qty 30

## 2014-08-27 MED ORDER — EPHEDRINE SULFATE 50 MG/ML IJ SOLN
INTRAMUSCULAR | Status: DC | PRN
  Administered 2014-08-27 (×2): 10 mg via INTRAVENOUS

## 2014-08-27 MED ORDER — OXYCODONE HCL 5 MG PO TABS: 5.0000 mg | ORAL_TABLET | ORAL | Status: DC | PRN

## 2014-08-27 MED ORDER — SCOPOLAMINE 1 MG/3DAYS TD PT72
MEDICATED_PATCH | TRANSDERMAL | Status: AC
  Administered 2014-08-27: 1 via TRANSDERMAL
  Filled 2014-08-27: qty 1

## 2014-08-27 MED ORDER — PHENYLEPHRINE 40 MCG/ML (10ML) SYRINGE FOR IV PUSH (FOR BLOOD PRESSURE SUPPORT)
PREFILLED_SYRINGE | INTRAVENOUS | Status: AC
  Filled 2014-08-27: qty 10

## 2014-08-27 MED ORDER — FENTANYL CITRATE 0.05 MG/ML IJ SOLN: 25.0000 ug | INTRAMUSCULAR | Status: DC | PRN

## 2014-08-27 SURGICAL SUPPLY — 57 items
APPLIER CLIP 9.375 MED OPEN (MISCELLANEOUS) ×3
APR CLP MED 9.3 20 MLT OPN (MISCELLANEOUS) ×1
BINDER BREAST LRG (GAUZE/BANDAGES/DRESSINGS) IMPLANT
BINDER BREAST XLRG (GAUZE/BANDAGES/DRESSINGS) ×2 IMPLANT
BLADE SURG 10 STRL SS (BLADE) ×3 IMPLANT
BLADE SURG 15 STRL LF DISP TIS (BLADE) ×1 IMPLANT
BLADE SURG 15 STRL SS (BLADE) ×3
CANISTER SUCTION 2500CC (MISCELLANEOUS) ×2 IMPLANT
CHLORAPREP W/TINT 26ML (MISCELLANEOUS) ×3 IMPLANT
CLIP APPLIE 9.375 MED OPEN (MISCELLANEOUS) ×1 IMPLANT
CLOSURE WOUND 1/2 X4 (GAUZE/BANDAGES/DRESSINGS) ×1
CONT SPEC 4OZ CLIKSEAL STRL BL (MISCELLANEOUS) ×4 IMPLANT
COVER PROBE W GEL 5X96 (DRAPES) ×3 IMPLANT
COVER SURGICAL LIGHT HANDLE (MISCELLANEOUS) ×2 IMPLANT
DEVICE DUBIN SPECIMEN MAMMOGRA (MISCELLANEOUS) ×3 IMPLANT
DRAPE CHEST BREAST 15X10 FENES (DRAPES) ×3 IMPLANT
DRSG TEGADERM 4X4.75 (GAUZE/BANDAGES/DRESSINGS) ×2 IMPLANT
ELECT COATED BLADE 2.86 ST (ELECTRODE) ×3 IMPLANT
ELECT REM PT RETURN 9FT ADLT (ELECTROSURGICAL) ×3
ELECTRODE REM PT RTRN 9FT ADLT (ELECTROSURGICAL) ×1 IMPLANT
GLOVE BIO SURGEON STRL SZ7 (GLOVE) ×8 IMPLANT
GLOVE BIO SURGEON STRL SZ7.5 (GLOVE) ×2 IMPLANT
GLOVE BIOGEL PI IND STRL 6.5 (GLOVE) IMPLANT
GLOVE BIOGEL PI IND STRL 7.0 (GLOVE) IMPLANT
GLOVE BIOGEL PI IND STRL 7.5 (GLOVE) ×1 IMPLANT
GLOVE BIOGEL PI INDICATOR 6.5 (GLOVE) ×2
GLOVE BIOGEL PI INDICATOR 7.0 (GLOVE) ×2
GLOVE BIOGEL PI INDICATOR 7.5 (GLOVE) ×2
GOWN STRL REUS W/ TWL LRG LVL3 (GOWN DISPOSABLE) ×2 IMPLANT
GOWN STRL REUS W/TWL LRG LVL3 (GOWN DISPOSABLE) ×6
KIT BASIN OR (CUSTOM PROCEDURE TRAY) ×3 IMPLANT
KIT MARKER MARGIN INK (KITS) ×3 IMPLANT
LIQUID BAND (GAUZE/BANDAGES/DRESSINGS) ×2 IMPLANT
NDL HYPO 25X1 1.5 SAFETY (NEEDLE) ×1 IMPLANT
NDL SAFETY ECLIPSE 18X1.5 (NEEDLE) IMPLANT
NEEDLE HYPO 18GX1.5 SHARP (NEEDLE)
NEEDLE HYPO 25X1 1.5 SAFETY (NEEDLE) ×3 IMPLANT
NS IRRIG 1000ML POUR BTL (IV SOLUTION) ×3 IMPLANT
PACK SURGICAL SETUP 50X90 (CUSTOM PROCEDURE TRAY) ×3 IMPLANT
PENCIL BUTTON HOLSTER BLD 10FT (ELECTRODE) ×3 IMPLANT
SPONGE LAP 18X18 X RAY DECT (DISPOSABLE) ×3 IMPLANT
STAPLER VISISTAT 35W (STAPLE) ×2 IMPLANT
STOCKINETTE IMPERVIOUS 9X36 MD (GAUZE/BANDAGES/DRESSINGS) IMPLANT
STRIP CLOSURE SKIN 1/2X4 (GAUZE/BANDAGES/DRESSINGS) ×1 IMPLANT
SUT MNCRL AB 4-0 PS2 18 (SUTURE) ×6 IMPLANT
SUT SILK 2 0 FS (SUTURE) ×2 IMPLANT
SUT SILK 2 0 SH (SUTURE) IMPLANT
SUT VIC AB 2-0 SH 27 (SUTURE) ×6
SUT VIC AB 2-0 SH 27XBRD (SUTURE) ×2 IMPLANT
SUT VIC AB 3-0 SH 27 (SUTURE) ×6
SUT VIC AB 3-0 SH 27X BRD (SUTURE) ×2 IMPLANT
SYR CONTROL 10ML LL (SYRINGE) ×3 IMPLANT
TOWEL OR 17X24 6PK STRL BLUE (TOWEL DISPOSABLE) ×3 IMPLANT
TOWEL OR 17X26 10 PK STRL BLUE (TOWEL DISPOSABLE) ×1 IMPLANT
TUBE CONNECTING 12'X1/4 (SUCTIONS) ×1
TUBE CONNECTING 12X1/4 (SUCTIONS) ×2 IMPLANT
YANKAUER SUCT BULB TIP NO VENT (SUCTIONS) ×2 IMPLANT

## 2014-08-27 NOTE — Interval H&P Note (Signed)
History and Physical Interval Note:  08/27/2014 7:05 AM  Teresa Aguilar  has presented today for surgery, with the diagnosis of RIGHT BREAST CANCER  The various methods of treatment have been discussed with the patient and family. After consideration of risks, benefits and other options for treatment, the patient has consented to  Procedure(s): RADIOACTIVE SEED GUIDED RIGHT BREAST PARTIAL MASTECTOMY WITH RIGHT AXILLARY SENTINEL LYMPH NODE BIOPSY (Right) as a surgical intervention .  The patient's history has been reviewed, patient examined, no change in status, stable for surgery.  I have reviewed the patient's chart and labs.  Questions were answered to the patient's satisfaction.     Brittie Whisnant

## 2014-08-27 NOTE — Transfer of Care (Signed)
Immediate Anesthesia Transfer of Care Note  Patient: Teresa Aguilar  Procedure(s) Performed: Procedure(s): RADIOACTIVE SEED GUIDED RIGHT BREAST LUMPECTOMY WITH RIGHT AXILLARY SENTINEL LYMPH NODE BIOPSY (Right)  Patient Location: PACU  Anesthesia Type:General  Level of Consciousness: awake, alert  and oriented  Airway & Oxygen Therapy: Patient Spontanous Breathing and Patient connected to nasal cannula oxygen  Post-op Assessment: Report given to RN and Post -op Vital signs reviewed and stable  Post vital signs: Reviewed and stable  Last Vitals:  Filed Vitals:   08/27/14 0908  BP:   Pulse:   Temp: 36.6 C  Resp:     Complications: No apparent anesthesia complications

## 2014-08-27 NOTE — Op Note (Signed)
Preoperative diagnosis: Clinical stage I right breast cancer Postoperative diagnosis: Same as above Procedure: #1 right breast radioactive seed guided lumpectomy #2 right axillary sentinel lymph node biopsy Surgeon: Dr. Serita Grammes Anesthesia: Gen. With LMA, right pectoral block Estimated blood loss: Minimal Complications: None Drains: None Specimens: #1 right breast tissue marked with paint #2 posterior margin marked short stitch superior, long stitch lateral, double stitch deep #3 right axillary sentinel node with counts of 557 Sponge and needle count was correct at completion Disposition to recovery stable  Indications: This is a 54 year old female who presents with a screening found right breast cancer. This appears to be a clinical stage I breast cancer. We discussed all of her options and decided to proceed with breast conserving therapy.  Procedure: She had a radioactive seed placed prior to beginning. I had these mammograms available for my review.  After infromed consent was obtained she was taken to the operating room. She received cefazolin. Sequential compression devices were on her legs. She underwent a right-sided pectoral block. She was placed under general anesthesia with an LMA. She was then prepped and draped in the standard sterile surgical fashion. A surgical timeout was then performed.  I used the neoprobe to identify the location of the radioactive seed in the right lower inner quadrant. I then made a curvilinear incision overlying the seed. I used the neoprobe to guide a excision with an attempt to get a clear margin. There was a small amount of tissue remaining posteriorly I removed separately. The neoprobe confirmed removal of the seed. I did a Faxitron mammogram where it confirmed removal of the mass, clip, and the seed. This was confirmed by radiology. There is no radioactivity remaining in the breast. I then placed 2 clips deep. I placed 1 clip in each position  around the cavity. This was sent to pathology. Pathology confirmed grossly my margins were clear. I then obtained hemostasis. I then closed this with 2-0 Vicryl, 3-0 Vicryl, and 4-0 Monocryl. Glue was placed over this.  I then located the sentinel nodes. I made an incision below the axillary hairline. I carried this through the axillary fascia. There were 2 sentinel nodes that were identified. I removed these together. There was no background radioactivity. I obtained hemostasis. I closed the fascia with 2-0 Vicryl. The skin was closed with 3-0 Vicryl and 4-0 Monocryl. Glue was placed over this incision. A binder was placed. She tolerated this well was extubated and transferred to the recovery room in stable condition.

## 2014-08-27 NOTE — Anesthesia Preprocedure Evaluation (Signed)
Anesthesia Evaluation  Patient identified by MRN, date of birth, ID band Patient awake    History of Anesthesia Complications (+) history of anesthetic complications  Airway        Dental   Pulmonary          Cardiovascular hypertension, DVT     Neuro/Psych  Headaches,    GI/Hepatic   Endo/Other  diabetesMorbid obesity  Renal/GU      Musculoskeletal  (+) Arthritis -,   Abdominal   Peds  Hematology   Anesthesia Other Findings   Reproductive/Obstetrics                             Anesthesia Physical Anesthesia Plan  ASA: III  Anesthesia Plan: General   Post-op Pain Management:    Induction: Intravenous  Airway Management Planned: LMA and Oral ETT  Additional Equipment:   Intra-op Plan:   Post-operative Plan: Extubation in OR  Informed Consent: I have reviewed the patients History and Physical, chart, labs and discussed the procedure including the risks, benefits and alternatives for the proposed anesthesia with the patient or authorized representative who has indicated his/her understanding and acceptance.   Dental advisory given  Plan Discussed with: CRNA and Surgeon  Anesthesia Plan Comments:         Anesthesia Quick Evaluation

## 2014-08-27 NOTE — Discharge Instructions (Signed)
[facility] °Office Phone Number [phone] ° °BREAST BIOPSY/ PARTIAL MASTECTOMY: POST OP INSTRUCTIONS ° °Always review your discharge instruction sheet given to you by the facility where your surgery was performed. ° °IF YOU HAVE DISABILITY OR FAMILY LEAVE FORMS, YOU MUST BRING THEM TO THE OFFICE FOR PROCESSING.  DO NOT GIVE THEM TO YOUR DOCTOR. ° °1. A prescription for pain medication may be given to you upon discharge.  Take your pain medication as prescribed, if needed.  If narcotic pain medicine is not needed, then you may take acetaminophen (Tylenol), naprosyn (Alleve) or ibuprofen (Advil) as needed. °2. Take your usually prescribed medications unless otherwise directed °3. If you need a refill on your pain medication, please contact your pharmacy.  They will contact our office to request authorization.  Prescriptions will not be filled after 5pm or on week-ends. °4. You should eat very light the first 24 hours after surgery, such as soup, crackers, pudding, etc.  Resume your normal diet the day after surgery. °5. Most patients will experience some swelling and bruising in the breast.  Ice packs and a good support bra will help.  Wear the breast binder provided or a sports bra for 72 hours day and night.  After that wear a sports bra during the day until you return to the office. Swelling and bruising can take several days to resolve.  °6. It is common to experience some constipation if taking pain medication after surgery.  Increasing fluid intake and taking a stool softener will usually help or prevent this problem from occurring.  A mild laxative (Milk of Magnesia or Miralax) should be taken according to package directions if there are no bowel movements after 48 hours. °7. Unless discharge instructions indicate otherwise, you may remove your bandages 48 hours after surgery and you may shower at that time.  You may have steri-strips (small skin tapes) in place directly over the incision.   These strips should be left on the skin for 7-10 days and will come off on their own.  If your surgeon used skin glue on the incision, you may shower in 24 hours.  The glue will flake off over the next 2-3 weeks.  Any sutures or staples will be removed at the office during your follow-up visit. °8. ACTIVITIES:  You may resume regular daily activities (gradually increasing) beginning the next day.  Wearing a good support bra or sports bra minimizes pain and swelling.  You may have sexual intercourse when it is comfortable. °a. You may drive when you no longer are taking prescription pain medication, you can comfortably wear a seatbelt, and you can safely maneuver your car and apply brakes. °b. RETURN TO WORK:  ______________________________________________________________________________________ °9. You should see your doctor in the office for a follow-up appointment approximately two weeks after your surgery.  Your doctor’s nurse will typically make your follow-up appointment when she calls you with your pathology report.  Expect your pathology report 3-4 business days after your surgery.  You may call to check if you do not hear from us after three days. °10. OTHER INSTRUCTIONS: _______________________________________________________________________________________________ _____________________________________________________________________________________________________________________________________ °_____________________________________________________________________________________________________________________________________ °_____________________________________________________________________________________________________________________________________ ° °WHEN TO CALL DR Odis Wickey: °1. Fever over 101.0 °2. Nausea and/or vomiting. °3. Extreme swelling or bruising. °4. Continued bleeding from incision. °5. Increased pain, redness, or drainage from the incision. ° °The clinic staff is available to  answer your questions during regular business hours.  Please don’t hesitate to call and ask to speak to one of the nurses for   clinical concerns.  If you have a medical emergency, go to the nearest emergency room or call 911.  A surgeon from [facility] is always on call at the hospital. ° °For further questions, please visit [email or web address] ° °

## 2014-08-27 NOTE — Progress Notes (Signed)
arrrived to 6n21 from PACU at this time, denies pain/nausea at this time, family at bedside

## 2014-08-27 NOTE — H&P (Signed)
54 yof who has significant lower back history and a si joint fusion last year with postop dvt. She is on xarelto. She underwent her regular screening mm. She had no symptoms, no masses, no discharge. She hadnt gotten a mm in 3-4 years. She does have a cousin with breast cancer that her brca testing was negative. She was noted to have a lower inner right breast irregular 2 cm mass on mm. On [place] there is a 11x11x15 mm irregular mass in the right breast 330 oclock 12 cm from the nipple. There is no right axillary lad. Her breast density is A and her left breast is negative. She underwent core biopsy that shows invasive ductal carcinoma with LVI, grade II-III, ER pos at 98%, pr pos at 76%, Ki67 is 66%, her 2 is negative. She is here today to discuss options.  Other Problems Illene Regulus, MA; 08/03/2014 8:14 AM) Back Pain Hypercholesterolemia Migraine Headache  Past Surgical History Lars Mage Spillers, MA; 08/03/2014 8:14 AM) Breast Biopsy Right. Colon Polyp Removal - Colonoscopy Colon Removal - Complete Foot Surgery Right. Hip Surgery Right. Oral Surgery Tonsillectomy  Diagnostic Studies History Lars Mage [place], [place]; 08/03/2014 8:14 AM) Colonoscopy within last year Mammogram within last year Pap Smear 1-5 years ago  Allergies Illene Regulus, MA; 08/03/2014 8:18 AM) Morphine Sulfate (Concentrate) *ANALGESICS - OPIOID* Sulfabenzamide *CHEMICALS*  Medication History (Alisha Spillers, MA; 08/03/2014 8:19 AM) Relpax (40MG Tablet, Oral) Active. Xarelto (20MG Tablet, Oral) Active. Medications Reconciled  Social History Lars Mage [place], [place]; 08/03/2014 8:14 AM) Caffeine use Carbonated beverages. No alcohol use No drug use Tobacco use Never smoker.  Family History Lars Mage [place], [place]; 08/03/2014 8:14 AM) Alcohol Abuse Brother. Diabetes Mellitus Father, Mother, Sister. Heart Disease Father, Mother. Heart disease in female family member before age  56 Hypertension Father, Mother.  Pregnancy / Birth History Lars Mage [place], [place]; 08/03/2014 8:14 AM) Age at menarche 58 years. Age of menopause 51-55 Contraceptive History Oral contraceptives. Gravida 6 Maternal age 10-25 Para 2  Review of Systems Lars Mage Nich[place] MA; 08/03/2014 8:14 AM) General Present- Fatigue, Night Sweats and Weight Gain. Not Present- Appetite Loss, Chills, Fever and Weight Loss. Skin Present- Change in Wart/Mole and Dryness. Not Present- Hives, Jaundice, New Lesions, Non-Healing Wounds, Rash and Ulcer. HEENT Present- Wears glasses/contact lenses. Not Present- Earache, Hearing Loss, Hoarseness, Nose Bleed, Oral Ulcers, Ringing in the Ears, Seasonal Allergies, Sinus Pain, Sore Throat, Visual Disturbances and Yellow Eyes. Breast Present- Breast Mass. Not Present- Breast Pain, Nipple Discharge and Skin Changes. Cardiovascular Present- Leg Cramps and Swelling of Extremities. Not Present- Chest Pain, Difficulty Breathing Lying Down, Palpitations, Rapid Heart Rate and Shortness of Breath. Musculoskeletal Present- Back Pain and Joint Pain. Not Present- Joint Stiffness, Muscle Pain, Muscle Weakness and Swelling of Extremities. Neurological Present- Headaches. Not Present- Decreased Memory, Fainting, Numbness, Seizures, Tingling, Tremor, Trouble walking and Weakness. Psychiatric Not Present- Anxiety, Bipolar, Change in Sleep Pattern, Depression, Fearful and Frequent crying. Endocrine Present- Hot flashes. Not Present- Cold Intolerance, Excessive Hunger, Hair Changes, Heat Intolerance and New Diabetes.   Vitals (Alisha Spillers MA; 08/03/2014 8:15 AM) 08/03/2014 8:14 AM Weight: 277 lb Height: 66in Body Surface Area: 2.42 m Body Mass Index: 44.71 kg/m Pulse: 96 (Regular)  BP: 136/86 (Sitting, Left Arm, Standard)    Physical Exam [name] MD; 08/03/2014 9:39 AM) General Mental Status-Alert. Orientation-Oriented X3.  Chest and Lung Exam Chest  and lung exam reveals -on auscultation, normal breath sounds, no adventitious sounds and normal vocal resonance.  Breast Nipples-No Discharge. Breast Lump-No Palpable  Breast Mass.   Cardiovascular Cardiovascular examination reveals -normal heart sounds, regular rate and rhythm with no murmurs.    Assessment & Plan [name] MD; 08/03/2014 9:47 AM) STAGE I BREAST CANCER, RIGHT (174.9  C50.911) Story: Right breast radioactive seed guided lumpectomy, right axillary sn biopsy We discussed genetics and she would like to forgo. We discussed the staging and pathophysiology of breast cancer. We discussed all of the different options for treatment for breast cancer including surgery, chemotherapy, radiation therapy, Herceptin, and antiestrogen therapy. We discussed a sentinel lymph node biopsy as she does not appear to having lymph node involvement right now. We discussed the performance of that with injection of radioactive tracer. We discussed that she would have an incision underneath her axillary hairline. We discussed that there is a chance of having a positive node with a sentinel lymph node biopsy and we will await the permanent pathology to make any other first further decisions in terms of her treatment. One of these options might be to return to the operating room to perform an axillary lymph node dissection. We discussed up to a 5% risk lifetime of chronic shoulder pain as well as lymphedema associated with a sentinel lymph node biopsy. We discussed the options for treatment of the breast cancer which included lumpectomy versus a mastectomy. We discussed the performance of the lumpectomy with seed placement. We discussed a 5% chance of a positive margin requiring reexcision in the operating room. We also discussed that she will need radiation therapy if she undergoes lumpectomy. We discussed the mastectomy and the postoperative care for that as well. We discussed that there is no  difference in her survival whether she undergoes lumpectomy with radiation therapy or antiestrogen therapy versus a mastectomy. I do not think she needs mri. We discussed the risks of operation including bleeding, infection, possible reoperation. She understands her further therapy will be based on what her stages at the time

## 2014-08-27 NOTE — Anesthesia Procedure Notes (Addendum)
Procedure Name: LMA Insertion Date/Time: 08/27/2014 7:40 AM Performed by: Mariea Clonts Pre-anesthesia Checklist: Patient identified, Timeout performed, Emergency Drugs available, Suction available and Patient being monitored Patient Re-evaluated:Patient Re-evaluated prior to inductionOxygen Delivery Method: Circle system utilized Preoxygenation: Pre-oxygenation with 100% oxygen Intubation Type: IV induction LMA: LMA inserted LMA Size: 4.0 Number of attempts: 1 Placement Confirmation: breath sounds checked- equal and bilateral and positive ETCO2 Tube secured with: Tape Dental Injury: Teeth and Oropharynx as per pre-operative assessment    Anesthesia Regional Block:  Pectoralis block  Pre-Anesthetic Checklist: ,, timeout performed, Correct Patient, Correct Site, Correct Laterality, Correct Procedure, Correct Position, site marked, surgical consent, at surgeon's request and post-op pain management  Laterality: Right and Upper  Prep: chloraprep       Needles:   Needle Type: Echogenic Needle     Needle Length: 9cm 9 cm Needle Gauge: 21 and 21 G  Needle insertion depth: 9 cm   Additional Needles:  Procedures: ultrasound guided (picture in chart) Pectoralis block Narrative:  Start time: 08/27/2014 7:45 AM End time: 08/27/2014 8:00 AM Injection made incrementally with aspirations every 5 mL.  Performed by: Personally  Anesthesiologist: Sanyia Dini

## 2014-08-27 NOTE — Anesthesia Postprocedure Evaluation (Signed)
  Anesthesia Post-op Note  Patient: Teresa Aguilar  Procedure(s) Performed: Procedure(s): RADIOACTIVE SEED GUIDED RIGHT BREAST LUMPECTOMY WITH RIGHT AXILLARY SENTINEL LYMPH NODE BIOPSY (Right)  Patient Location: PACU  Anesthesia Type:General and GA combined with regional for post-op pain  Level of Consciousness: awake and alert   Airway and Oxygen Therapy: Patient Spontanous Breathing  Post-op Pain: mild  Post-op Assessment: Post-op Vital signs reviewed  Post-op Vital Signs: stable  Last Vitals:  Filed Vitals:   08/27/14 1109  BP:   Pulse: 76  Temp:   Resp: 14    Complications: No apparent anesthesia complications

## 2014-08-28 ENCOUNTER — Encounter (HOSPITAL_COMMUNITY): Payer: Self-pay | Admitting: General Surgery

## 2014-08-28 DIAGNOSIS — D0511 Intraductal carcinoma in situ of right breast: Secondary | ICD-10-CM | POA: Diagnosis not present

## 2014-08-28 MED ORDER — OXYCODONE HCL 5 MG PO TABS: 5.0000 mg | ORAL_TABLET | ORAL | Status: DC | PRN

## 2014-08-28 NOTE — Discharge Summary (Signed)
Physician Discharge Summary  Patient ID: JANI MORONTA MRN: [id number] DOB/AGE: 1961/06/08 54 y.o.  Admit date: 08/27/2014 Discharge date: 08/28/2014  Admission Diagnoses: Breast cancer  Discharge Diagnoses:  Active Problems:   Breast cancer   Discharged Condition: good  Hospital Course: 60 yof who underwent right lumpectomy/sn biopsy for breast cancer. Remained overnight and is doing well the following morning.  Consults: None  Significant Diagnostic Studies: none  Treatments: surgery: right lumpectomy/right ax sn biopsy   Disposition: 01-Home or Self Care     Medication List    TAKE these medications        acetaminophen-codeine 300-30 MG per tablet  Commonly known as:  TYLENOL #3  Take 1 tablet by mouth 2 (two) times daily as needed for moderate pain.     eletriptan 40 MG tablet  Commonly known as:  RELPAX  Take 40 mg by mouth as needed for migraine or headache. One tablet by mouth at onset of headache. May repeat in 2 hours if headache persists or recurs.     oxyCODONE 5 MG immediate release tablet  Commonly known as:  Oxy IR/ROXICODONE  Take 1-2 tablets (5-10 mg total) by mouth every 4 (four) hours as needed for moderate pain.     oxyCODONE-acetaminophen 10-325 MG per tablet  Commonly known as:  PERCOCET  Take 1 tablet by mouth every 6 (six) hours as needed for pain.     rivaroxaban 20 MG Tabs tablet  Commonly known as:  XARELTO  Take 20 mg by mouth every morning.           Follow-up Information    Follow up with [facility], [place] In 3 weeks.   Specialty:  General Surgery   Contact information:   [place] [id number] [phone]       Signed: [name] 08/28/2014, 7:13 AM

## 2014-08-28 NOTE — Discharge Planning (Signed)
Patient discharged home in stable condition. Verbalizes understanding of all discharge instructions, including home medications and follow up appointments. 

## 2014-09-03 ENCOUNTER — Other Ambulatory Visit: Payer: Self-pay | Admitting: *Deleted

## 2014-09-03 DIAGNOSIS — C50919 Malignant neoplasm of unspecified site of unspecified female breast: Secondary | ICD-10-CM

## 2014-09-03 DIAGNOSIS — C50311 Malignant neoplasm of lower-inner quadrant of right female breast: Secondary | ICD-10-CM

## 2014-09-03 NOTE — Progress Notes (Signed)
Location of Breast Cancer: right breast lower inner quadrant  Histology per Pathology Report:   08/27/14 Diagnosis 1. Breast, lumpectomy, Right - INVASIVE DUCTAL CARCINOMA, SEE COMMENT. - POSITIVE FOR LYMPH VASCULAR INVASION. - POSITIVE FOR PERINEURAL INVASION. - INVASIVE TUMOR IS 1 MM FROM THE NEAREST MARGIN (LATERAL). - DUCTAL CARCINOMA IN SITU. - PREVIOUS BIOPSY SITE. - SEE TUMOR SYNOPTIC TEMPLATE BELOW. 2. Breast, excision, Right posterior margin - BENIGN BREAST TISSUE, SEE COMMENT. - NEGATIVE FOR ATYPIA OR MALIGNANCY.  07/24/14 Diagnosis Breast, right, needle core biopsy, mass, LIQ, 3:30 12 cmfn - INVASIVE DUCTAL CARCINOMA. - LYMPHOVASCULAR INVASION IS IDENTIFIED. - SEE COMMENT.  Receptor Status: ER(98%+), PR (76%+), Her2-neu (negative)  Did patient present with symptoms (if so, please note symptoms) or was this found on screening mammography?: screening mammography  Past/Anticipated interventions by surgeon, if any: 08/27/14 - Procedure: RADIOACTIVE SEED GUIDED RIGHT BREAST LUMPECTOMY WITH RIGHT AXILLARY SENTINEL LYMPH NODE BIOPSY;  Surgeon: [name], MD;  Location: [facility];  Service: General;  Laterality: Right;  Past/Anticipated interventions by medical oncology, if any: will start chemotherapy on 4/11 - every 3 weeks for 4 cycles.  To start radiation after.  Lymphedema issues, if any: no  Pain issues, if any: has lower back pain from an injury at work. Has some discomfort with movement of her right arm.  OB GYN history: Age at menarche: 82 years, menopause 15-55 years, used bcp for 1 year, gravida 5, para 2, maternal age 62-25  SAFETY ISSUES:  Prior radiation? no  Pacemaker/ICD? no  Possible current pregnancy?no  Is the patient on methotrexate? no  Current Complaints / other details: Patient had surgery 02/22/14 -: [place] and then had blood clots in her right leg after surgery. She is taking xarelto. She has two sons (70 and 37), and one  granddaughter. She is a Technical sales engineer.  Patient is here today with her mother and sister.  She has noticed some ripples on the outer portion of her right breast since surgery.  She was given diflucan for a yeast infection under her right breast.

## 2014-09-04 ENCOUNTER — Encounter: Payer: Self-pay | Admitting: Oncology

## 2014-09-04 ENCOUNTER — Encounter: Payer: Self-pay | Admitting: *Deleted

## 2014-09-04 ENCOUNTER — Ambulatory Visit: Payer: BC Managed Care – PPO

## 2014-09-04 ENCOUNTER — Other Ambulatory Visit (HOSPITAL_BASED_OUTPATIENT_CLINIC_OR_DEPARTMENT_OTHER): Payer: BC Managed Care – PPO

## 2014-09-04 ENCOUNTER — Ambulatory Visit (HOSPITAL_BASED_OUTPATIENT_CLINIC_OR_DEPARTMENT_OTHER): Payer: BC Managed Care – PPO | Admitting: Oncology

## 2014-09-04 VITALS — BP 155/79 | HR 84 | Temp 97.9°F | Resp 18 | Ht 67.0 in | Wt 278.9 lb

## 2014-09-04 DIAGNOSIS — C50812 Malignant neoplasm of overlapping sites of left female breast: Secondary | ICD-10-CM

## 2014-09-04 DIAGNOSIS — C50811 Malignant neoplasm of overlapping sites of right female breast: Secondary | ICD-10-CM

## 2014-09-04 DIAGNOSIS — M532X8 Spinal instabilities, sacral and sacrococcygeal region: Secondary | ICD-10-CM

## 2014-09-04 DIAGNOSIS — I82401 Acute embolism and thrombosis of unspecified deep veins of right lower extremity: Secondary | ICD-10-CM

## 2014-09-04 DIAGNOSIS — Z6841 Body Mass Index (BMI) 40.0 and over, adult: Secondary | ICD-10-CM

## 2014-09-04 DIAGNOSIS — C50311 Malignant neoplasm of lower-inner quadrant of right female breast: Secondary | ICD-10-CM

## 2014-09-04 DIAGNOSIS — Z170 Estrogen receptor positive status [ER+]: Secondary | ICD-10-CM | POA: Diagnosis not present

## 2014-09-04 DIAGNOSIS — D126 Benign neoplasm of colon, unspecified: Secondary | ICD-10-CM

## 2014-09-04 DIAGNOSIS — C50919 Malignant neoplasm of unspecified site of unspecified female breast: Secondary | ICD-10-CM

## 2014-09-04 DIAGNOSIS — G44009 Cluster headache syndrome, unspecified, not intractable: Secondary | ICD-10-CM | POA: Insufficient documentation

## 2014-09-04 DIAGNOSIS — G43109 Migraine with aura, not intractable, without status migrainosus: Secondary | ICD-10-CM

## 2014-09-04 DIAGNOSIS — G43909 Migraine, unspecified, not intractable, without status migrainosus: Secondary | ICD-10-CM | POA: Insufficient documentation

## 2014-09-04 DIAGNOSIS — I82491 Acute embolism and thrombosis of other specified deep vein of right lower extremity: Secondary | ICD-10-CM

## 2014-09-04 LAB — COMPREHENSIVE METABOLIC PANEL (CC13)
ALBUMIN: 3.7 g/dL (ref 3.5–5.0)
ALT: 20 U/L (ref 0–55)
ANION GAP: 11 meq/L (ref 3–11)
AST: 14 U/L (ref 5–34)
Alkaline Phosphatase: 98 U/L (ref 40–150)
BUN: 11.7 mg/dL (ref 7.0–26.0)
CO2: 22 mEq/L (ref 22–29)
Calcium: 9 mg/dL (ref 8.4–10.4)
Chloride: 109 mEq/L (ref 98–109)
Creatinine: 0.7 mg/dL (ref 0.6–1.1)
GLUCOSE: 119 mg/dL (ref 70–140)
Potassium: 3.8 mEq/L (ref 3.5–5.1)
SODIUM: 142 meq/L (ref 136–145)
TOTAL PROTEIN: 7 g/dL (ref 6.4–8.3)
Total Bilirubin: 0.44 mg/dL (ref 0.20–1.20)

## 2014-09-04 LAB — CBC WITH DIFFERENTIAL/PLATELET
BASO%: 0.3 % (ref 0.0–2.0)
BASOS ABS: 0 10*3/uL (ref 0.0–0.1)
EOS ABS: 0.1 10*3/uL (ref 0.0–0.5)
EOS%: 1.5 % (ref 0.0–7.0)
HCT: 40.2 % (ref 34.8–46.6)
HEMOGLOBIN: 13.6 g/dL (ref 11.6–15.9)
LYMPH%: 30.8 % (ref 14.0–49.7)
MCH: 31.1 pg (ref 25.1–34.0)
MCHC: 33.8 g/dL (ref 31.5–36.0)
MCV: 91.8 fL (ref 79.5–101.0)
MONO#: 0.5 10*3/uL (ref 0.1–0.9)
MONO%: 8.1 % (ref 0.0–14.0)
NEUT%: 59.3 % (ref 38.4–76.8)
NEUTROS ABS: 3.5 10*3/uL (ref 1.5–6.5)
Platelets: 213 10*3/uL (ref 145–400)
RBC: 4.38 10*6/uL (ref 3.70–5.45)
RDW: 14.3 % (ref 11.2–14.5)
WBC: 6 10*3/uL (ref 3.9–10.3)
lymph#: 1.8 10*3/uL (ref 0.9–3.3)

## 2014-09-04 MED ORDER — FLUCONAZOLE 100 MG PO TABS: 100.0000 mg | ORAL_TABLET | Freq: Every day | ORAL | Status: DC

## 2014-09-04 NOTE — Progress Notes (Signed)
Checked in new pt with no financial concerns at this time.  Pt has Raquel's card for any billing questions or concerns. ° °

## 2014-09-04 NOTE — Progress Notes (Signed)
[facility]  [phone] [phone]     ID: DAKOTA STANGL DOB: 12-12-1960  MR#: [id number]  LTJ#:[id number]  Patient Care Team: Stephens Shire, MD as PCP - General (Family Medicine) PCP: Stephens Shire, MD GYN: [name] MD SU: [name] MD OTHER MD: [name] MD, Phylliss Bob MD, Jamie Kato MD  CHIEF COMPLAINT: Estrogen receptor positive breast cancer  CURRENT TREATMENT: Adjuvant chemotherapy   BREAST CANCER HISTORY: "Teresa Aguilar" had routine screening mammography January 2016 in Dr. Tonette Bihari office showing a possible change in the right breast. On 07/24/2014 at the breast Center she underwent right digital mammography and ultrasonography. The breast density was category A. In the lower inner quadrant of the right breast there was a 2 cm mass which was palpable by exam. Ultrasound confirmed a 1.5 cm irregular hypoechoic mass at the 3:30 o'clock position 12 cm from the nipple. There was no right axillary adenopathy.  Biopsy of the mass in question 07/24/2014 showed ([id number]) invasive ductal carcinoma, grade 2 or 3, estrogen receptor 98% positive, progesterone receptor 76% positive, both with strong staining intensity, with an MIB-1 of 66% and no HER-2 amplification by FISH.  The patient's case was discussed at the multidisciplinary breast cancer conference 08/08/2014 and it was felt the patient would benefit from breast conserving surgery and likely would need an Oncotype to decide on optimal systemic therapy. She would need radiation and hormones. The question of genetics counseling was also raised.  On 08/27/2014 the patient underwent right lumpectomy and sentinel lymph node sampling. The final pathology ([id number]) confirmed invasive ductal carcinoma, grade 3, measuring 1.9 cm. There was evidence of lymphovascular and perineural invasion. Margins were negative but close, the closest margin for the in situ component being 1 mm.  The single sentinel lymph node was negative. Repeat HER-2 was again negative, with a signals ratio of 0.97 and a number per cell of 1.60.  The patient's subsequent history is as detailed below  INTERVAL HISTORY: Teresa Aguilar was evaluated in the breast clinic 09/04/2014, accompanied by her sister Hassan Rowan and her cousin Learta Codding (who is also my patient).  REVIEW OF SYSTEMS: Teresa Aguilar did well with her surgery, without unusual pain, fever, or bleeding. She does have some soreness in the surgical breast, as expected. Aside from these problems, she had a right lower extremity deep vein clots (perineal) following right sided sacroiliac joint fusion 02/22/2014. She has been on Rivaroxaban since that time and is hoping to be able to come off that anticoagulant in April 2016. It hers to walk and she is undergoing physical therapy at this time. Otherwise her only exercises walking in a pool. She has had some blood in her stool, secondary to hemorrhoids. She had a colonoscopy June 2015 under Dr. Collene Mares which did show a tubulovillous adenoma with high-grade dysplasia. A detailed review of systems today was otherwise noncontributory   PAST MEDICAL HISTORY: Past Medical History  Diagnosis Date  . Hypoxia     after surgery  . DVT (deep venous thrombosis) 03/2014    R leg, post op- on Xarelto   . Hypertension     pt. reports that she has been higher in the past & again now but doesn't take any med.,never has for ^BP, pt. stating that BP is high now b/c of her pain in her back.    . H/O cardiovascular stress test 2012     test done for stress from her Father dying, had chest pain ,pt.  had a stress/echo  test, told it was wnl  . Anxiety     relative for to pain & frustration of prev. hospitalization   . PONV (postoperative nausea and vomiting)     low O2 sats, < 50% post op  . Family history of adverse reaction to anesthesia     PONV  . Gestational diabetes 1986; 1996  . Migraines     "at least q other week"  (08/27/2014)  . Arthritis     degenerative lumbar spine    . Chronic back pain   . Breast cancer of lower-inner quadrant of right female breast   . Colon cancer     PAST SURGICAL HISTORY: Past Surgical History  Procedure Laterality Date  . Endometrial ablation  ~ 1998  . Colonoscopy w/ polypectomy  2015    found tubulovillous adenoma with focal high grade displasia  . Plantar fascia release Right ~ 2000  . Eye muscle surgery Bilateral ~ 1965    to fix cross-eye as child  . Sacroiliac joint fusion Right 02/22/2014    Procedure: SACROILIAC JOINT FUSION;  Surgeon: Sinclair Ship, MD;  Location: [facility];  Service: Orthopedics;  Laterality: Right;  Right sided sacroiliac joint fusion  . Radiofrequency ablation nerves      x 2 to nerves in her lower back  . Dental surgery  ~ 2011    "replaced bone graft upper jaw; put 2 implants in"  . Tubal ligation  1996  . Axillary sentinel node biopsy Right 08/27/2014    [facility] End[facility] 08/27/2014  . Tonsillectomy  1988  . Excisional hemorrhoidectomy  2015  . Back surgery    . Breast biopsy Right 07/24/2014    core biopsy  . Breast lumpectomy Right 08/27/2014    [facility] Endo 08/27/2014  . Radioactive seed guided mastectomy with axillary sentinel lymph node biopsy Right 08/27/2014    Procedure: RADIOACTIVE SEED GUIDED RIGHT BREAST LUMPECTOMY WITH RIGHT AXILLARY SENTINEL LYMPH NODE BIOPSY;  Surgeon: [name], MD;  Location: [facility];  Service: General;  Laterality: Right;    FAMILY HISTORY Family History  Problem Relation Age of Onset  . Breast cancer Cousin   . Breast cancer Maternal Aunt   . Breast cancer Paternal Aunt    the patient's father died at the age of 59 from heart failure in the setting of diabetes. The patient's mother is currently 76 years old. The patient has 3 brothers, 2 sisters. There is no history of breast, ovarian, or colon cancer in first-degree relatives. However, on the mother's side the patient has 3 relatives with breast cancer (a  cousin diagnosed age 19, a second cousin diagnosed age 60, and a great aunt). There is also a history of colon cancer diagnosed age around age 49 in a great aunt and small intestinal cancer in an aunt diagnosed age 64. On the father's side there are 2 cousins with breast cancer diagnosed age 49 and 73, and a cousin with colon cancer diagnosed age 20  GYNECOLOGIC HISTORY:  No LMP recorded. Patient has had an ablation. Menarche age 75, the patient carried 2 children to term, the first at age 49. After the first live birth she had a premature girl who died after one day (she had been a breech birth) and she also had 3 miscarriages. The patient underwent endometrial ablation in 1998 and has had no periods since that time.  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 Her husband  Braulio Conte works for [employer] in the [employer] and [employer]. Son Harvie Bridge lives in [place] and works in Education officer, community. He was an Gaffer) and son Lynelle Smoke is 56 and works for [employer]. He also lives in [place]. The patient has one granddaughter. The patient is a Psychologist, forensic    ADVANCED DIRECTIVES: Not in place   HEALTH MAINTENANCE: History  Substance Use Topics  . Smoking status: Never Smoker   . Smokeless tobacco: Never Used  . Alcohol Use: No     Colonoscopy: June 2015/May and last repeat planned 10/03/2014  PAP: 2015  Bone density:  Lipid panel:  Allergies  Allergen Reactions  . Morphine And Related Nausea And Vomiting  . Sulfa Antibiotics Hives    Current Outpatient Prescriptions  Medication Sig Dispense Refill  . acetaminophen-codeine (TYLENOL #3) 300-30 MG per tablet Take 1 tablet by mouth 2 (two) times daily as needed for moderate pain.    Marland Kitchen eletriptan (RELPAX) 40 MG tablet Take 40 mg by mouth as needed for migraine or headache. One tablet by mouth at onset of headache. May repeat  in 2 hours if headache persists or recurs.    Marland Kitchen oxyCODONE (OXY IR/ROXICODONE) 5 MG immediate release tablet Take 1-2 tablets (5-10 mg total) by mouth every 4 (four) hours as needed for moderate pain. 30 tablet 0  . oxyCODONE-acetaminophen (PERCOCET) 10-325 MG per tablet Take 1 tablet by mouth every 6 (six) hours as needed for pain. 20 tablet 0  . rivaroxaban (XARELTO) 20 MG TABS tablet Take 20 mg by mouth every morning.     No current facility-administered medications for this visit.    OBJECTIVE: Middle-aged white woman who walks with difficulty Filed Vitals:   09/04/14 1541  BP: 155/79  Pulse: 84  Temp: 97.9 F (36.6 C)  Resp: 18     Body mass index is 43.67 kg/(m^2).    ECOG FS:2 - Symptomatic, <50% confined to bed  Ocular: Sclerae unicteric, pupils equal, round and reactive to light Ear-nose-throat: Oropharynx clear and moist Lymphatic: No cervical or supraclavicular adenopathy Lungs no rales or rhonchi, good excursion bilaterally Heart regular rate and rhythm, no murmur appreciated Abd soft, obese, nontender, positive bowel sounds MSK no focal spinal tenderness, no joint edema Neuro: non-focal, well-oriented, positive affect Breasts: The right breast is status post lumpectomy and sentinel lymph node sampling. The incisions are healing well, although there is very minimal seepage from the lateral aspect of the incision in the inframammary fold. That area also shows a candidal rash, with satellatization over the breast bone area. There is no palpable mass in the right breast in the right axilla is benign. The left breast is unremarkable.   LAB RESULTS:  CMP     Component Value Date/Time   NA 142 09/04/2014 1523   NA 139 08/20/2014 1557   K 3.8 09/04/2014 1523   K 3.9 08/20/2014 1557   CL 107 08/20/2014 1557   CO2 22 09/04/2014 1523   CO2 22 08/20/2014 1557   GLUCOSE 119 09/04/2014 1523   GLUCOSE 109* 08/20/2014 1557   BUN 11.7 09/04/2014 1523   BUN 14 08/20/2014 1557    CREATININE 0.7 09/04/2014 1523   CREATININE 0.73 08/20/2014 1557   CALCIUM 9.0 09/04/2014 1523   CALCIUM 8.5 08/20/2014 1557   PROT 7.0 09/04/2014 1523   PROT 6.5 08/20/2014 1557   ALBUMIN 3.7 09/04/2014 1523   ALBUMIN 3.8 08/20/2014 1557   AST 14 09/04/2014 1523  AST 20 08/20/2014 1557   ALT 20 09/04/2014 1523   ALT 17 08/20/2014 1557   ALKPHOS 98 09/04/2014 1523   ALKPHOS 91 08/20/2014 1557   BILITOT 0.44 09/04/2014 1523   BILITOT 0.7 08/20/2014 1557   GFRNONAA >90 08/20/2014 1557   GFRAA >90 08/20/2014 1557    INo results found for: SPEP, UPEP  Lab Results  Component Value Date   WBC 6.0 09/04/2014   NEUTROABS 3.5 09/04/2014   HGB 13.6 09/04/2014   HCT 40.2 09/04/2014   MCV 91.8 09/04/2014   PLT 213 09/04/2014      Chemistry      Component Value Date/Time   NA 142 09/04/2014 1523   NA 139 08/20/2014 1557   K 3.8 09/04/2014 1523   K 3.9 08/20/2014 1557   CL 107 08/20/2014 1557   CO2 22 09/04/2014 1523   CO2 22 08/20/2014 1557   BUN 11.7 09/04/2014 1523   BUN 14 08/20/2014 1557   CREATININE 0.7 09/04/2014 1523   CREATININE 0.73 08/20/2014 1557      Component Value Date/Time   CALCIUM 9.0 09/04/2014 1523   CALCIUM 8.5 08/20/2014 1557   ALKPHOS 98 09/04/2014 1523   ALKPHOS 91 08/20/2014 1557   AST 14 09/04/2014 1523   AST 20 08/20/2014 1557   ALT 20 09/04/2014 1523   ALT 17 08/20/2014 1557   BILITOT 0.44 09/04/2014 1523   BILITOT 0.7 08/20/2014 1557       No results found for: LABCA2  No components found for: LABCA125  No results for input(s): INR in the last 168 hours.  Urinalysis    Component Value Date/Time   COLORURINE AMBER* 02/21/2014 1540   APPEARANCEUR CLOUDY* 02/21/2014 1540   LABSPEC 1.029 02/21/2014 1540   PHURINE 5.0 02/21/2014 1540   GLUCOSEU NEGATIVE 02/21/2014 1540   HGBUR SMALL* 02/21/2014 1540   BILIRUBINUR SMALL* 02/21/2014 1540   KETONESUR 15* 02/21/2014 1540   PROTEINUR NEGATIVE 02/21/2014 1540   UROBILINOGEN 1.0  02/21/2014 1540   NITRITE POSITIVE* 02/21/2014 1540   LEUKOCYTESUR TRACE* 02/21/2014 1540    STUDIES: Nm Sentinel Node Inj-no Rpt (breast)  08/27/2014   CLINICAL DATA: right axillary sentinel node biopsy   Sulfur colloid was injected intradermally by the nuclear medicine  technologist for breast cancer sentinel node localization.    Mm Breast Surgical Specimen  08/27/2014   CLINICAL DATA:  Patient had radioactive seed localization for a biopsy-proven malignancy in the lower inner quadrant of the right breast.  EXAM: SPECIMEN RADIOGRAPH OF THE RIGHT BREAST  COMPARISON:  Previous exam(s).  FINDINGS: Status post excision of the right breast. The radioactive seed and biopsy marker clip are present, completely intact, and were marked for pathology.  IMPRESSION: Specimen radiograph of the right breast.   Electronically Signed   By: Curlene Dolphin M.D.   On: 08/27/2014 08:41   Mm Rt Radioactive Seed Loc Mammo Guide  08/24/2014   CLINICAL DATA:  Right breast cancer for seed placement.  EXAM: MAMMOGRAPHIC GUIDED RADIOACTIVE SEED LOCALIZATION OF THE RIGHT BREAST  COMPARISON:  Previous exam(s)  FINDINGS: Patient presents for radioactive seed localization prior to right breast surgery. I met with the patient and we discussed the procedure of seed localization including benefits and alternatives. We discussed the high likelihood of a successful procedure. We discussed the risks of the procedure including infection, bleeding, tissue injury and further surgery. We discussed the low dose of radioactivity involved in the procedure. Informed, written consent was given.  The usual  time-out protocol was performed immediately prior to the procedure.  Using mammographic guidance, sterile technique, 2% lidocaine and an I-125 radioactive seed, masses and biopsy clip of medial right breast localized using a medial approach. The follow-up mammogram images confirm the seed in the expected location and are marked for Dr. Donne Hazel.   Follow-up survey of the patient confirms presence of radioactive seed.  Order number of I-125 seed:  [id number].  Total activity:  0.25 mCi  Reference Date: August 03, 2014  The patient tolerated the procedure well and was released from the [facility]. She was given instructions regarding seed removal.  IMPRESSION: Radioactive seed localization right breast. No apparent complications.   Electronically Signed   By: Abelardo Diesel M.D.   On: 08/24/2014 13:28    ASSESSMENT: 54 y.o. [place] woman status post right lumpectomy and sentinel lymph node sampling 08/27/2014 for a pT1c pN0, stage IA invasive ductal carcinoma, grade 3, estrogen and progesterone receptor positive, HER-2 negative, with an MIB-1 of 66%  (1) adjuvant chemotherapy will consist of cyclophosphamide and docetaxel every 21 days 4, with onpro support  (2) adjuvant radiation will follow chemotherapy  (3) antiestrogen therapy for 10 years will follow radiation  (4) genetics consultation has been requested  (5) a hypercoagulable panel will be sent with next lab draw  (6) a nutrition consult has been requested   PLAN: We spent the better part of today's hour-long appointment discussing the biology of breast cancer in general, and the specifics of the patient's tumor in particular. Teresa Aguilar has a good understanding of the difference between local and systemic therapy for breast cancer. She clearly will benefit from radiation, which may include the regional lymph node drainage areas at Dr. Clabe Seal discretion because of the evidence of significant lymphovascular and perineural invasion.  From a systemic treatment point of view, she will clearly benefit from anti-estrogens. In my opinion, she also will benefit from chemotherapy. Although NCCN guidelines suggest an Oncotype DX be sent in situations like this, this is a large aggressive stage I tumor and I would be uncomfortable not treating it with chemotherapy. Teresa Aguilar has sig good  grasp of this and agrees to proceed.   We then discussed the possible toxicities, side effects and complications of Cytoxan and Taxotere, which is what she will receive. We are going to use onpro as her "immune booster" since she would otherwise have to travel from [place] on day 2 after each treatment cycle.  I am concerned that she is unable to exercise vigorously at this point because of her hip and back problems. She has had risk for significant weight gain. I am requesting a nutrition consult as a result. I am also requesting a meeting with our genetics counselor given the significant history of both breast and colon cancer in this family.  Finally, not only because of her DVT after the earlier surgery, but also because of her multiple miscarriages, I am obtaining a hypercoagulable panel within next set of labs.  Teresa Aguilar has a good understanding of the overall plan. She agrees with it. She knows the goal of treatment in her case is cure. She will call with any problems that may develop before her next visit here.  Chauncey Cruel, MD   09/04/2014 5:47 PM Medical Oncology and Hematology [facility] [address] [place], [place] [address] Tel. [phone]    Fax. [phone]

## 2014-09-04 NOTE — Progress Notes (Signed)
Met with pt for new pt appt with Dr. Jana Hakim. Gave pt navigation resources and contact information. Denies needs at this time. Encourage pt to call with questions or concerns. Received verbal understanding.

## 2014-09-06 ENCOUNTER — Telehealth: Payer: Self-pay | Admitting: Oncology

## 2014-09-06 NOTE — Telephone Encounter (Signed)
Called patinet and she is aware of her appointments and will eval them in [employer]

## 2014-09-12 ENCOUNTER — Encounter: Payer: Self-pay | Admitting: Radiation Oncology

## 2014-09-12 ENCOUNTER — Ambulatory Visit
Admission: RE | Admit: 2014-09-12 | Discharge: 2014-09-12 | Disposition: A | Discharge status: Home / Self Care | Payer: BC Managed Care – PPO | Source: Ambulatory Visit | Attending: Radiation Oncology | Admitting: Radiation Oncology

## 2014-09-12 VITALS — BP 156/84 | HR 81 | Temp 98.0°F | Resp 16 | Ht 67.0 in | Wt 278.3 lb

## 2014-09-12 DIAGNOSIS — C50311 Malignant neoplasm of lower-inner quadrant of right female breast: Secondary | ICD-10-CM | POA: Diagnosis not present

## 2014-09-12 NOTE — Progress Notes (Signed)
Radiation Oncology         [phone] ________________________________  Name: Teresa Aguilar MRN: [id number]  Date: 09/12/2014  DOB: 14-Nov-1960  Follow-Up Visit Note  CC: Stephens Shire, MD  [name], MD    ICD-9-CM ICD-10-CM   1. Carcinoma of lower-inner quadrant of right breast 174.3 C50.311     Diagnosis:   pT1c pN0, stage IA invasive ductal carcinoma of the right breast, grade 3, estrogen and progesterone receptor positive    Narrative:  The patient returns today for further evaluation. She was initially seen in the multidisciplinary breast clinic. Since that time the patient has undergone her definitive surgery by Dr. Donne Hazel including a right lumpectomy and sentinel node sampling. Performed on 08/27/2014. The patient was found to have a 1.9 cm high-grade invasive ductal carcinoma within the lower inner quadrant of the right breast. The surgical margins were clear with the closest margin being 1 mm. The sentinel node recovered showed no evidence of metastasis. The patient was found to have a high proliferation marker of 66%. Patient has met with medical oncology and recommendations have been made for adjuvant chemotherapy. Patient is now seen to be further evaluated for breast conservation therapy.                             ALLERGIES:  is allergic to morphine and related and sulfa antibiotics.  Meds: Current Outpatient Prescriptions  Medication Sig Dispense Refill  . acetaminophen-codeine (TYLENOL #3) 300-30 MG per tablet   0  . eletriptan (RELPAX) 40 MG tablet Take 40 mg by mouth as needed for migraine or headache. One tablet by mouth at onset of headache. May repeat in 2 hours if headache persists or recurs.    . fluconazole (DIFLUCAN) 100 MG tablet Take 1 tablet (100 mg total) by mouth daily. 10 tablet 0  . rivaroxaban (XARELTO) 20 MG TABS tablet Take 20 mg by mouth every morning.     No current facility-administered medications for this encounter.     Physical Findings: The patient is in no acute distress. Patient is alert and oriented.  height is _0  (1.702 m) and weight is 278 lb 4.8 oz (126.236 kg). Her oral temperature is 98 F (36.7 C). Her blood pressure is 156/84 and her pulse is 81. Her respiration is 16. Marland Kitchen  No palpable supraclavicular or axillary adenopathy. The lungs are clear to auscultation. The heart has a regular rhythm and rate. Examination of the right breast reveals a scar in the lower inner quadrant which is healing well without signs of drainage or infection. The patient has a separate scar in the right axillary region from her sentinel node procedure.  Lab Findings: Lab Results  Component Value Date   WBC 6.0 09/04/2014   HGB 13.6 09/04/2014   HCT 40.2 09/04/2014   MCV 91.8 09/04/2014   PLT 213 09/04/2014    Radiographic Findings: Nm Sentinel Node Inj-no Rpt (breast)  08/27/2014   CLINICAL DATA: right axillary sentinel node biopsy   Sulfur colloid was injected intradermally by the nuclear medicine  technologist for breast cancer sentinel node localization.    Mm Breast Surgical Specimen  08/27/2014   CLINICAL DATA:  Patient had radioactive seed localization for a biopsy-proven malignancy in the lower inner quadrant of the right breast.  EXAM: SPECIMEN RADIOGRAPH OF THE RIGHT BREAST  COMPARISON:  Previous exam(s).  FINDINGS: Status post excision of the right breast. The radioactive seed  and biopsy marker clip are present, completely intact, and were marked for pathology.  IMPRESSION: Specimen radiograph of the right breast.   Electronically Signed   By: Curlene Dolphin M.D.   On: 08/27/2014 08:41   Mm Rt Radioactive Seed Loc Mammo Guide  08/24/2014   CLINICAL DATA:  Right breast cancer for seed placement.  EXAM: MAMMOGRAPHIC GUIDED RADIOACTIVE SEED LOCALIZATION OF THE RIGHT BREAST  COMPARISON:  Previous exam(s)  FINDINGS: Patient presents for radioactive seed localization prior to right breast surgery. I met with  the patient and we discussed the procedure of seed localization including benefits and alternatives. We discussed the high likelihood of a successful procedure. We discussed the risks of the procedure including infection, bleeding, tissue injury and further surgery. We discussed the low dose of radioactivity involved in the procedure. Informed, written consent was given.  The usual time-out protocol was performed immediately prior to the procedure.  Using mammographic guidance, sterile technique, 2% lidocaine and an I-125 radioactive seed, masses and biopsy clip of medial right breast localized using a medial approach. The follow-up mammogram images confirm the seed in the expected location and are marked for Dr. Donne Hazel.  Follow-up survey of the patient confirms presence of radioactive seed.  Order number of I-125 seed:  [id number].  Total activity:  0.25 mCi  Reference Date: August 03, 2014  The patient tolerated the procedure well and was released from the [facility]. She was given instructions regarding seed removal.  IMPRESSION: Radioactive seed localization right breast. No apparent complications.   Electronically Signed   By: Abelardo Diesel M.D.   On: 08/24/2014 13:28    Impression:  pT1c pN0, stage IA invasive ductal carcinoma of the right breast. The patient would be a good candidate for breast conservation with radiation therapy directed at the right breast. The surgical margins are close but clear. She would proceed with whole breast irradiation followed by a boost to the lumpectomy cavity. I discussed treatment course side effects and potential toxicities of radiation therapy in this situation with the patient and her family. The patient wishes to proceed with radiation therapy as part of her management. As above the patient will proceed with adjuvant chemotherapy and then return to radiation oncology.  Plan:  The patient will proceed with adjuvant chemotherapy.  Referral back to radiation  oncology once she has completed this course of therapy.  ____________________________________ Blair Promise, MD

## 2014-09-12 NOTE — Progress Notes (Signed)
Please see the Nurse Progress Note in the MD Initial Consult Encounter for this patient. 

## 2014-09-17 ENCOUNTER — Ambulatory Visit: Payer: BC Managed Care – PPO | Admitting: Nutrition

## 2014-09-17 ENCOUNTER — Encounter: Payer: Self-pay | Admitting: General Practice

## 2014-09-17 ENCOUNTER — Other Ambulatory Visit: Payer: BC Managed Care – PPO

## 2014-09-17 NOTE — Progress Notes (Signed)
54 year old female diagnosed with ER positive breast cancer.  She is a patient of Dr. Jana Hakim.  Past medical history includes DVT, hypertension, anxiety, gestational diabetes, migraines.  Medications include Xarelto and Diflucan.  Labs were reviewed.  Height: 67 inches. Weight: 278.3 pounds. Usual body weight: 230 pounds per patient.  Patient weighed 257 pounds October 2015. BMI: 43.58.  Been unable to exercise secondary to hip surgery.  Physician is hoping patient can avoid weight gain, during treatment.  Nutrition diagnosis: Food and nutrition related knowledge deficit related to breast cancer and associated treatments as evidenced by no prior need for nutrition related information.  Intervention: Patient was educated to consume 3 meals plus one snack consisting of low fat plant-based diet. Provided examples of ways patient can incorporate more vegetables and whole fruits. Encouraged patient's consumption of whole gr. Encouraged activity as permitted by physician. Fact sheets were provided.  Questions were answered.  Teach back method used.  Monitoring, evaluation, goals: Patient will tolerate healthier plant-based diet to avoid weight gain, during treatment.

## 2014-09-17 NOTE — Progress Notes (Signed)
Visited at length with pt, who goes by Teresa Aguilar, as she was the only person in chemo class this morning.  She was in good spirits, reporting support from sisters and husband.  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  Her children are 35 and 36; the elder, she adds, has recently moved back home for support in dealing with PTSD from [employer], which is an additional stressor.  Provided opportunity for Teresa Aguilar to share and process these concerns, offering pastoral presence, reflective listening, normalization of feelings, and introduction to [facility] team/resources.  She is aware of ongoing Spiritual Care and team availability.  [place], [place]

## 2014-09-18 ENCOUNTER — Other Ambulatory Visit: Payer: Self-pay | Admitting: General Surgery

## 2014-09-18 ENCOUNTER — Encounter (HOSPITAL_BASED_OUTPATIENT_CLINIC_OR_DEPARTMENT_OTHER): Payer: Self-pay | Admitting: *Deleted

## 2014-09-18 NOTE — Anesthesia Preprocedure Evaluation (Addendum)
Anesthesia Evaluation  Patient identified by MRN, date of birth, ID band Patient awake    Reviewed: Allergy & Precautions, NPO status , Patient's Chart, lab work & pertinent test results, reviewed documented beta blocker date and time   History of Anesthesia Complications (+) PONV  Airway Mallampati: II   Neck ROM: Full    Dental  (+) Caps, Dental Advisory Given   Pulmonary  breath sounds clear to auscultation        Cardiovascular hypertension, Pt. on medications Rhythm:Regular     Neuro/Psych  Headaches, Anxiety    GI/Hepatic   Endo/Other    Renal/GU      Musculoskeletal   Abdominal (+) + obese,   Peds  Hematology   Anesthesia Other Findings   Reproductive/Obstetrics                          Anesthesia Physical Anesthesia Plan  ASA: III  Anesthesia Plan: MAC   Post-op Pain Management:    Induction: Intravenous  Airway Management Planned: Nasal Cannula  Additional Equipment:   Intra-op Plan:   Post-operative Plan:   Informed Consent: I have reviewed the patients History and Physical, chart, labs and discussed the procedure including the risks, benefits and alternatives for the proposed anesthesia with the patient or authorized representative who has indicated his/her understanding and acceptance.     Plan Discussed with:   Anesthesia Plan Comments: (Check am labs)       Anesthesia Quick Evaluation

## 2014-09-18 NOTE — Progress Notes (Signed)
No pre-op orders in EPIC, called Dr. Cristal Generous office and spoke with Colletta [place]. Requested orders for pre-op

## 2014-09-19 ENCOUNTER — Ambulatory Visit (HOSPITAL_COMMUNITY): Payer: BC Managed Care – PPO

## 2014-09-19 ENCOUNTER — Ambulatory Visit (HOSPITAL_COMMUNITY): Payer: BC Managed Care – PPO | Admitting: Anesthesiology

## 2014-09-19 ENCOUNTER — Encounter (HOSPITAL_COMMUNITY)
Admission: RE | Disposition: A | Discharge status: Home / Self Care | Payer: Self-pay | Source: Ambulatory Visit | Attending: General Surgery

## 2014-09-19 ENCOUNTER — Ambulatory Visit (HOSPITAL_BASED_OUTPATIENT_CLINIC_OR_DEPARTMENT_OTHER)
Admission: RE | Admit: 2014-09-19 | Discharge: 2014-09-19 | Disposition: A | Discharge status: Home / Self Care | Payer: BC Managed Care – PPO | Source: Ambulatory Visit | Attending: General Surgery | Admitting: General Surgery

## 2014-09-19 ENCOUNTER — Encounter: Payer: Self-pay | Admitting: Nurse Practitioner

## 2014-09-19 ENCOUNTER — Encounter (HOSPITAL_COMMUNITY): Payer: Self-pay | Admitting: *Deleted

## 2014-09-19 ENCOUNTER — Other Ambulatory Visit (HOSPITAL_BASED_OUTPATIENT_CLINIC_OR_DEPARTMENT_OTHER): Payer: BC Managed Care – PPO

## 2014-09-19 ENCOUNTER — Ambulatory Visit (HOSPITAL_BASED_OUTPATIENT_CLINIC_OR_DEPARTMENT_OTHER): Payer: BC Managed Care – PPO | Admitting: Nurse Practitioner

## 2014-09-19 DIAGNOSIS — E669 Obesity, unspecified: Secondary | ICD-10-CM | POA: Insufficient documentation

## 2014-09-19 DIAGNOSIS — G43909 Migraine, unspecified, not intractable, without status migrainosus: Secondary | ICD-10-CM | POA: Diagnosis not present

## 2014-09-19 DIAGNOSIS — C50911 Malignant neoplasm of unspecified site of right female breast: Secondary | ICD-10-CM | POA: Diagnosis not present

## 2014-09-19 DIAGNOSIS — Z95828 Presence of other vascular implants and grafts: Secondary | ICD-10-CM

## 2014-09-19 DIAGNOSIS — Z7901 Long term (current) use of anticoagulants: Secondary | ICD-10-CM | POA: Insufficient documentation

## 2014-09-19 DIAGNOSIS — Z6841 Body Mass Index (BMI) 40.0 and over, adult: Secondary | ICD-10-CM

## 2014-09-19 DIAGNOSIS — G43109 Migraine with aura, not intractable, without status migrainosus: Secondary | ICD-10-CM

## 2014-09-19 DIAGNOSIS — Z170 Estrogen receptor positive status [ER+]: Secondary | ICD-10-CM | POA: Diagnosis not present

## 2014-09-19 DIAGNOSIS — I10 Essential (primary) hypertension: Secondary | ICD-10-CM | POA: Diagnosis not present

## 2014-09-19 DIAGNOSIS — C50311 Malignant neoplasm of lower-inner quadrant of right female breast: Secondary | ICD-10-CM

## 2014-09-19 DIAGNOSIS — E119 Type 2 diabetes mellitus without complications: Secondary | ICD-10-CM | POA: Diagnosis not present

## 2014-09-19 DIAGNOSIS — I82491 Acute embolism and thrombosis of other specified deep vein of right lower extremity: Secondary | ICD-10-CM

## 2014-09-19 DIAGNOSIS — Z79899 Other long term (current) drug therapy: Secondary | ICD-10-CM | POA: Insufficient documentation

## 2014-09-19 DIAGNOSIS — Z9889 Other specified postprocedural states: Secondary | ICD-10-CM | POA: Insufficient documentation

## 2014-09-19 DIAGNOSIS — M532X8 Spinal instabilities, sacral and sacrococcygeal region: Secondary | ICD-10-CM

## 2014-09-19 HISTORY — PX: PORTACATH PLACEMENT: SHX2246

## 2014-09-19 LAB — CBC WITH DIFFERENTIAL/PLATELET
BASO%: 0.7 % (ref 0.0–2.0)
Basophils Absolute: 0.1 10*3/uL (ref 0.0–0.1)
EOS%: 0.4 % (ref 0.0–7.0)
Eosinophils Absolute: 0 10*3/uL (ref 0.0–0.5)
HEMATOCRIT: 40.8 % (ref 34.8–46.6)
HGB: 13.3 g/dL (ref 11.6–15.9)
LYMPH%: 16.6 % (ref 14.0–49.7)
MCH: 29.6 pg (ref 25.1–34.0)
MCHC: 32.6 g/dL (ref 31.5–36.0)
MCV: 90.9 fL (ref 79.5–101.0)
MONO#: 0.4 10*3/uL (ref 0.1–0.9)
MONO%: 5.4 % (ref 0.0–14.0)
NEUT#: 6.4 10*3/uL (ref 1.5–6.5)
NEUT%: 76.9 % — ABNORMAL HIGH (ref 38.4–76.8)
PLATELETS: 185 10*3/uL (ref 145–400)
RBC: 4.49 10*6/uL (ref 3.70–5.45)
RDW: 14.8 % — ABNORMAL HIGH (ref 11.2–14.5)
WBC: 8.3 10*3/uL (ref 3.9–10.3)
lymph#: 1.4 10*3/uL (ref 0.9–3.3)

## 2014-09-19 LAB — COMPREHENSIVE METABOLIC PANEL (CC13)
ALT: 15 U/L (ref 0–55)
AST: 12 U/L (ref 5–34)
Albumin: 3.6 g/dL (ref 3.5–5.0)
Alkaline Phosphatase: 99 U/L (ref 40–150)
Anion Gap: 14 mEq/L — ABNORMAL HIGH (ref 3–11)
BUN: 13.5 mg/dL (ref 7.0–26.0)
CHLORIDE: 107 meq/L (ref 98–109)
CO2: 19 mEq/L — ABNORMAL LOW (ref 22–29)
Calcium: 8.7 mg/dL (ref 8.4–10.4)
Creatinine: 0.8 mg/dL (ref 0.6–1.1)
EGFR: 85 mL/min/{1.73_m2} — ABNORMAL LOW (ref >90)
Glucose: 174 mg/dl — ABNORMAL HIGH (ref 70–140)
POTASSIUM: 3.9 meq/L (ref 3.5–5.1)
Sodium: 141 mEq/L (ref 136–145)
Total Bilirubin: 0.45 mg/dL (ref 0.20–1.20)
Total Protein: 6.4 g/dL (ref 6.4–8.3)

## 2014-09-19 LAB — BASIC METABOLIC PANEL WITH GFR
Anion gap: 12 (ref 5–15)
BUN: 13 mg/dL (ref 6–23)
CO2: 23 mmol/L (ref 19–32)
Calcium: 9 mg/dL (ref 8.4–10.5)
Chloride: 103 mmol/L (ref 96–112)
Creatinine, Ser: 0.75 mg/dL (ref 0.50–1.10)
GFR calc Af Amer: >90 mL/min
GFR calc non Af Amer: >90 mL/min
Glucose, Bld: 133 mg/dL — ABNORMAL HIGH (ref 70–99)
Potassium: 3.7 mmol/L (ref 3.5–5.1)
Sodium: 138 mmol/L (ref 135–145)

## 2014-09-19 LAB — CBC
HEMATOCRIT: 40.7 % (ref 36.0–46.0)
HEMOGLOBIN: 13.5 g/dL (ref 12.0–15.0)
MCH: 30.1 pg (ref 26.0–34.0)
MCHC: 33.2 g/dL (ref 30.0–36.0)
MCV: 90.8 fL (ref 78.0–100.0)
Platelets: 211 10*3/uL (ref 150–400)
RBC: 4.48 MIL/uL (ref 3.87–5.11)
RDW: 14.3 % (ref 11.5–15.5)
WBC: 5 10*3/uL (ref 4.0–10.5)

## 2014-09-19 SURGERY — INSERTION, TUNNELED CENTRAL VENOUS DEVICE, WITH PORT
Anesthesia: Monitor Anesthesia Care

## 2014-09-19 MED ORDER — FENTANYL CITRATE 0.05 MG/ML IJ SOLN
25.0000 ug | INTRAMUSCULAR | Status: DC | PRN
  Administered 2014-09-19: 25 ug via INTRAVENOUS

## 2014-09-19 MED ORDER — DEXAMETHASONE 4 MG PO TABS: 8.0000 mg | ORAL_TABLET | Freq: Two times a day (BID) | ORAL | Status: DC

## 2014-09-19 MED ORDER — SODIUM CHLORIDE 0.9 % IR SOLN
Status: DC | PRN
  Administered 2014-09-19: 09:00:00

## 2014-09-19 MED ORDER — PROPOFOL INFUSION 10 MG/ML OPTIME
INTRAVENOUS | Status: DC | PRN
  Administered 2014-09-19: 25 ug/kg/min via INTRAVENOUS

## 2014-09-19 MED ORDER — OXYCODONE HCL 5 MG PO TABS
ORAL_TABLET | ORAL | Status: AC
  Filled 2014-09-19: qty 1

## 2014-09-19 MED ORDER — HEPARIN SOD (PORK) LOCK FLUSH 100 UNIT/ML IV SOLN
INTRAVENOUS | Status: AC
  Filled 2014-09-19: qty 5

## 2014-09-19 MED ORDER — SCOPOLAMINE 1 MG/3DAYS TD PT72
1.0000 | MEDICATED_PATCH | TRANSDERMAL | Status: DC
  Administered 2014-09-19: 1.5 mg via TRANSDERMAL
  Filled 2014-09-19: qty 1

## 2014-09-19 MED ORDER — 0.9 % SODIUM CHLORIDE (POUR BTL) OPTIME
TOPICAL | Status: DC | PRN
  Administered 2014-09-19: 1000 mL

## 2014-09-19 MED ORDER — LIDOCAINE HCL (CARDIAC) 20 MG/ML IV SOLN
INTRAVENOUS | Status: AC
  Filled 2014-09-19: qty 5

## 2014-09-19 MED ORDER — DEXTROSE 5 % IV SOLN
3.0000 g | INTRAVENOUS | Status: AC
  Administered 2014-09-19: 3 g via INTRAVENOUS
  Filled 2014-09-19: qty 3000

## 2014-09-19 MED ORDER — FENTANYL CITRATE 0.05 MG/ML IJ SOLN
INTRAMUSCULAR | Status: DC
Start: 2014-09-19 — End: 2014-09-19
  Filled 2014-09-19: qty 2

## 2014-09-19 MED ORDER — OXYCODONE HCL 5 MG PO TABS
5.0000 mg | ORAL_TABLET | ORAL | Status: DC | PRN
  Administered 2014-09-19: 5 mg via ORAL

## 2014-09-19 MED ORDER — PROPOFOL 10 MG/ML IV BOLUS
INTRAVENOUS | Status: DC | PRN
  Administered 2014-09-19: 50 mg via INTRAVENOUS

## 2014-09-19 MED ORDER — MIDAZOLAM HCL 5 MG/5ML IJ SOLN
INTRAMUSCULAR | Status: DC | PRN
  Administered 2014-09-19: 2 mg via INTRAVENOUS

## 2014-09-19 MED ORDER — HEPARIN SOD (PORK) LOCK FLUSH 100 UNIT/ML IV SOLN
INTRAVENOUS | Status: DC | PRN
  Administered 2014-09-19: 500 [IU] via INTRAVENOUS

## 2014-09-19 MED ORDER — BUPIVACAINE HCL (PF) 0.25 % IJ SOLN
INTRAMUSCULAR | Status: AC
  Filled 2014-09-19: qty 30

## 2014-09-19 MED ORDER — LIDOCAINE HCL (CARDIAC) 20 MG/ML IV SOLN
INTRAVENOUS | Status: DC | PRN
  Administered 2014-09-19: 100 mg via INTRAVENOUS

## 2014-09-19 MED ORDER — OXYCODONE-ACETAMINOPHEN 10-325 MG PO TABS: 1.0000 | ORAL_TABLET | Freq: Four times a day (QID) | ORAL | Status: DC | PRN

## 2014-09-19 MED ORDER — MEPERIDINE HCL 25 MG/ML IJ SOLN: 6.2500 mg | INTRAMUSCULAR | Status: DC | PRN

## 2014-09-19 MED ORDER — LIDOCAINE-PRILOCAINE 2.5-2.5 % EX CREA: TOPICAL_CREAM | CUTANEOUS | Status: DC

## 2014-09-19 MED ORDER — ARTIFICIAL TEARS OP OINT
TOPICAL_OINTMENT | OPHTHALMIC | Status: AC
  Filled 2014-09-19: qty 3.5

## 2014-09-19 MED ORDER — LACTATED RINGERS IV SOLN
INTRAVENOUS | Status: DC | PRN
  Administered 2014-09-19: 08:00:00 via INTRAVENOUS

## 2014-09-19 MED ORDER — ONDANSETRON HCL 8 MG PO TABS: 8.0000 mg | ORAL_TABLET | Freq: Two times a day (BID) | ORAL | Status: DC

## 2014-09-19 MED ORDER — PROMETHAZINE HCL 25 MG/ML IJ SOLN: 6.2500 mg | INTRAMUSCULAR | Status: DC | PRN

## 2014-09-19 MED ORDER — BUPIVACAINE HCL (PF) 0.25 % IJ SOLN
INTRAMUSCULAR | Status: DC | PRN
  Administered 2014-09-19: 20 mL

## 2014-09-19 MED ORDER — FENTANYL CITRATE 0.05 MG/ML IJ SOLN
INTRAMUSCULAR | Status: DC | PRN
  Administered 2014-09-19: 50 ug via INTRAVENOUS
  Administered 2014-09-19 (×2): 25 ug via INTRAVENOUS
  Administered 2014-09-19: 50 ug via INTRAVENOUS

## 2014-09-19 MED ORDER — FENTANYL CITRATE 0.05 MG/ML IJ SOLN
INTRAMUSCULAR | Status: AC
  Filled 2014-09-19: qty 5

## 2014-09-19 MED ORDER — PROCHLORPERAZINE MALEATE 10 MG PO TABS: 10.0000 mg | ORAL_TABLET | Freq: Four times a day (QID) | ORAL | Status: DC | PRN

## 2014-09-19 MED ORDER — LORAZEPAM 0.5 MG PO TABS: 0.5000 mg | ORAL_TABLET | Freq: Four times a day (QID) | ORAL | Status: DC | PRN

## 2014-09-19 MED ORDER — MIDAZOLAM HCL 2 MG/2ML IJ SOLN
INTRAMUSCULAR | Status: AC
  Filled 2014-09-19: qty 2

## 2014-09-19 MED ORDER — BUPIVACAINE HCL (PF) 0.25 % IJ SOLN
INTRAMUSCULAR | Status: AC
  Filled 2014-09-19: qty 10

## 2014-09-19 MED ORDER — PROPOFOL 10 MG/ML IV BOLUS
INTRAVENOUS | Status: AC
  Filled 2014-09-19: qty 20

## 2014-09-19 MED ORDER — ONDANSETRON HCL 4 MG/2ML IJ SOLN
INTRAMUSCULAR | Status: AC
  Filled 2014-09-19: qty 2

## 2014-09-19 SURGICAL SUPPLY — 59 items
BAG DECANTER FOR FLEXI CONT (MISCELLANEOUS) ×3 IMPLANT
BLADE SURG 11 STRL SS (BLADE) ×3 IMPLANT
BLADE SURG 15 STRL LF DISP TIS (BLADE) ×1 IMPLANT
BLADE SURG 15 STRL SS (BLADE) ×3
CANISTER SUCTION 2500CC (MISCELLANEOUS) IMPLANT
CHLORAPREP W/TINT 26ML (MISCELLANEOUS) ×3 IMPLANT
CLOSURE WOUND 1/2 X4 (GAUZE/BANDAGES/DRESSINGS) ×1
COVER PROBE W GEL 5X96 (DRAPES) ×2 IMPLANT
COVER SURGICAL LIGHT HANDLE (MISCELLANEOUS) ×3 IMPLANT
COVER TRANSDUCER ULTRASND GEL (DRAPE) ×2 IMPLANT
CRADLE DONUT ADULT HEAD (MISCELLANEOUS) ×3 IMPLANT
DECANTER SPIKE VIAL GLASS SM (MISCELLANEOUS) ×3 IMPLANT
DRAPE C-ARM 42X72 X-RAY (DRAPES) ×3 IMPLANT
DRAPE LAPAROSCOPIC ABDOMINAL (DRAPES) ×3 IMPLANT
ELECT CAUTERY BLADE 6.4 (BLADE) ×3 IMPLANT
ELECT REM PT RETURN 9FT ADLT (ELECTROSURGICAL) ×3
ELECTRODE REM PT RTRN 9FT ADLT (ELECTROSURGICAL) ×1 IMPLANT
GAUZE SPONGE 4X4 16PLY XRAY LF (GAUZE/BANDAGES/DRESSINGS) ×3 IMPLANT
GEL ULTRASOUND 20GR AQUASONIC (MISCELLANEOUS) ×2 IMPLANT
GLOVE BIO SURGEON STRL SZ7 (GLOVE) ×3 IMPLANT
GLOVE BIOGEL PI IND STRL 7.0 (GLOVE) IMPLANT
GLOVE BIOGEL PI IND STRL 7.5 (GLOVE) ×1 IMPLANT
GLOVE BIOGEL PI INDICATOR 7.0 (GLOVE) ×4
GLOVE BIOGEL PI INDICATOR 7.5 (GLOVE) ×2
GLOVE SURG SS PI 7.0 STRL IVOR (GLOVE) ×2 IMPLANT
GOWN STRL REUS W/ TWL LRG LVL3 (GOWN DISPOSABLE) ×2 IMPLANT
GOWN STRL REUS W/TWL LRG LVL3 (GOWN DISPOSABLE) ×6
INTRODUCER COOK 11FR (CATHETERS) IMPLANT
KIT BASIN OR (CUSTOM PROCEDURE TRAY) ×3 IMPLANT
KIT PORT POWER 8FR ISP CVUE (Catheter) ×2 IMPLANT
KIT PORT POWER 9.6FR MRI PREA (Catheter) IMPLANT
KIT ROOM TURNOVER OR (KITS) ×3 IMPLANT
LIQUID BAND (GAUZE/BANDAGES/DRESSINGS) ×3 IMPLANT
NDL HYPO 25GX1X1/2 BEV (NEEDLE) ×1 IMPLANT
NEEDLE HYPO 25GX1X1/2 BEV (NEEDLE) ×3 IMPLANT
NS IRRIG 1000ML POUR BTL (IV SOLUTION) ×3 IMPLANT
PACK SURGICAL SETUP 50X90 (CUSTOM PROCEDURE TRAY) ×3 IMPLANT
PAD ARMBOARD 7.5X6 YLW CONV (MISCELLANEOUS) ×4 IMPLANT
PENCIL BUTTON HOLSTER BLD 10FT (ELECTRODE) ×3 IMPLANT
POWER PORT CLEARVUE ×1 IMPLANT
SET INTRODUCER 12FR PACEMAKER (SHEATH) IMPLANT
SET SHEATH INTRODUCER 10FR (MISCELLANEOUS) IMPLANT
SHEATH COOK PEEL AWAY SET 9F (SHEATH) IMPLANT
STAPLER VISISTAT 35W (STAPLE) ×2 IMPLANT
STRIP CLOSURE SKIN 1/2X4 (GAUZE/BANDAGES/DRESSINGS) ×1 IMPLANT
SUT MNCRL AB 4-0 PS2 18 (SUTURE) ×3 IMPLANT
SUT PROLENE 2 0 SH DA (SUTURE) ×3 IMPLANT
SUT SILK 2 0 (SUTURE)
SUT SILK 2-0 18XBRD TIE 12 (SUTURE) IMPLANT
SUT VIC AB 3-0 SH 27 (SUTURE) ×3
SUT VIC AB 3-0 SH 27XBRD (SUTURE) ×1 IMPLANT
SYR 20ML ECCENTRIC (SYRINGE) ×6 IMPLANT
SYR 5ML LUER SLIP (SYRINGE) ×3 IMPLANT
SYR CONTROL 10ML LL (SYRINGE) ×2 IMPLANT
TOWEL OR 17X24 6PK STRL BLUE (TOWEL DISPOSABLE) ×3 IMPLANT
TOWEL OR 17X26 10 PK STRL BLUE (TOWEL DISPOSABLE) ×3 IMPLANT
TUBE CONNECTING 12'X1/4 (SUCTIONS)
TUBE CONNECTING 12X1/4 (SUCTIONS) IMPLANT
YANKAUER SUCT BULB TIP NO VENT (SUCTIONS) IMPLANT

## 2014-09-19 NOTE — Anesthesia Postprocedure Evaluation (Signed)
  Anesthesia Post-op Note  Patient: Teresa Aguilar  Procedure(s) Performed: Procedure(s): INSERTION PORT-A-CATH (N/A)  Patient Location: PACU  Anesthesia Type:General  Level of Consciousness: awake  Airway and Oxygen Therapy: Patient Spontanous Breathing and Patient connected to nasal cannula oxygen  Post-op Pain: mild  Post-op Assessment: Post-op Vital signs reviewed and Patient's Cardiovascular Status Stable  Post-op Vital Signs: Reviewed and stable  Last Vitals:  Filed Vitals:   09/19/14 0945  BP: 153/78  Pulse: 77  Temp:   Resp: 16    Complications: No apparent anesthesia complications

## 2014-09-19 NOTE — Interval H&P Note (Signed)
History and Physical Interval Note: Underwent lumpectomy/sn with negative margins and negative node. She has seen med onc and will begin chemotherapy. We will place port for venous access today 09/19/2014 8:11 AM  Teresa Aguilar  has presented today for surgery, with the diagnosis of RIGHT BREAST CANCER  The various methods of treatment have been discussed with the patient and family. After consideration of risks, benefits and other options for treatment, the patient has consented to  Procedure(s): INSERTION PORT-A-CATH (N/A) as a surgical intervention .  The patient's history has been reviewed, patient examined, no change in status, stable for surgery.  I have reviewed the patient's chart and labs.  Questions were answered to the patient's satisfaction.     Teresa Aguilar

## 2014-09-19 NOTE — H&P (View-Only) (Signed)
54 yof who has significant lower back history and a si joint fusion last year with postop dvt. She is on xarelto. She underwent her regular screening mm. She had no symptoms, no masses, no discharge. She hadnt gotten a mm in 3-4 years. She does have a cousin with breast cancer that her brca testing was negative. She was noted to have a lower inner right breast irregular 2 cm mass on mm. On [place] there is a 11x11x15 mm irregular mass in the right breast 330 oclock 12 cm from the nipple. There is no right axillary lad. Her breast density is A and her left breast is negative. She underwent core biopsy that shows invasive ductal carcinoma with LVI, grade II-III, ER pos at 98%, pr pos at 76%, Ki67 is 66%, her 2 is negative. She is here today to discuss options.  Other Problems Illene Regulus, MA; 08/03/2014 8:14 AM) Back Pain Hypercholesterolemia Migraine Headache  Past Surgical History Lars Mage Spillers, MA; 08/03/2014 8:14 AM) Breast Biopsy Right. Colon Polyp Removal - Colonoscopy Colon Removal - Complete Foot Surgery Right. Hip Surgery Right. Oral Surgery Tonsillectomy  Diagnostic Studies History Lars Mage [place], [place]; 08/03/2014 8:14 AM) Colonoscopy within last year Mammogram within last year Pap Smear 1-5 years ago  Allergies Illene Regulus, MA; 08/03/2014 8:18 AM) Morphine Sulfate (Concentrate) *ANALGESICS - OPIOID* Sulfabenzamide *CHEMICALS*  Medication History (Alisha Spillers, MA; 08/03/2014 8:19 AM) Relpax (40MG Tablet, Oral) Active. Xarelto (20MG Tablet, Oral) Active. Medications Reconciled  Social History Lars Mage [place], [place]; 08/03/2014 8:14 AM) Caffeine use Carbonated beverages. No alcohol use No drug use Tobacco use Never smoker.  Family History Lars Mage [place], [place]; 08/03/2014 8:14 AM) Alcohol Abuse Brother. Diabetes Mellitus Father, Mother, Sister. Heart Disease Father, Mother. Heart disease in female family member before age  56 Hypertension Father, Mother.  Pregnancy / Birth History Lars Mage [place], [place]; 08/03/2014 8:14 AM) Age at menarche 54 years. Age of menopause 51-55 Contraceptive History Oral contraceptives. Gravida 6 Maternal age 10-25 Para 2  Review of Systems Lars Mage [place] MA; 08/03/2014 8:14 AM) General Present- Fatigue, Night Sweats and Weight Gain. Not Present- Appetite Loss, Chills, Fever and Weight Loss. Skin Present- Change in Wart/Mole and Dryness. Not Present- Hives, Jaundice, New Lesions, Non-Healing Wounds, Rash and Ulcer. HEENT Present- Wears glasses/contact lenses. Not Present- Earache, Hearing Loss, Hoarseness, Nose Bleed, Oral Ulcers, Ringing in the Ears, Seasonal Allergies, Sinus Pain, Sore Throat, Visual Disturbances and Yellow Eyes. Breast Present- Breast Mass. Not Present- Breast Pain, Nipple Discharge and Skin Changes. Cardiovascular Present- Leg Cramps and Swelling of Extremities. Not Present- Chest Pain, Difficulty Breathing Lying Down, Palpitations, Rapid Heart Rate and Shortness of Breath. Musculoskeletal Present- Back Pain and Joint Pain. Not Present- Joint Stiffness, Muscle Pain, Muscle Weakness and Swelling of Extremities. Neurological Present- Headaches. Not Present- Decreased Memory, Fainting, Numbness, Seizures, Tingling, Tremor, Trouble walking and Weakness. Psychiatric Not Present- Anxiety, Bipolar, Change in Sleep Pattern, Depression, Fearful and Frequent crying. Endocrine Present- Hot flashes. Not Present- Cold Intolerance, Excessive Hunger, Hair Changes, Heat Intolerance and New Diabetes.   Vitals (Alisha Spillers MA; 08/03/2014 8:15 AM) 08/03/2014 8:14 AM Weight: 277 lb Height: 66in Body Surface Area: 2.42 m Body Mass Index: 44.71 kg/m Pulse: 96 (Regular)  BP: 136/86 (Sitting, Left Arm, Standard)    Physical Exam [name] MD; 08/03/2014 9:39 AM) General Mental Status-Alert. Orientation-Oriented X3.  Chest and Lung Exam Chest  and lung exam reveals -on auscultation, normal breath sounds, no adventitious sounds and normal vocal resonance.  Breast Nipples-No Discharge. Breast Lump-No Palpable  Breast Mass.   Cardiovascular Cardiovascular examination reveals -normal heart sounds, regular rate and rhythm with no murmurs.    Assessment & Plan [name] MD; 08/03/2014 9:47 AM) STAGE I BREAST CANCER, RIGHT (174.9  C50.911) Story: Right breast radioactive seed guided lumpectomy, right axillary sn biopsy We discussed genetics and she would like to forgo. We discussed the staging and pathophysiology of breast cancer. We discussed all of the different options for treatment for breast cancer including surgery, chemotherapy, radiation therapy, Herceptin, and antiestrogen therapy. We discussed a sentinel lymph node biopsy as she does not appear to having lymph node involvement right now. We discussed the performance of that with injection of radioactive tracer. We discussed that she would have an incision underneath her axillary hairline. We discussed that there is a chance of having a positive node with a sentinel lymph node biopsy and we will await the permanent pathology to make any other first further decisions in terms of her treatment. One of these options might be to return to the operating room to perform an axillary lymph node dissection. We discussed up to a 5% risk lifetime of chronic shoulder pain as well as lymphedema associated with a sentinel lymph node biopsy. We discussed the options for treatment of the breast cancer which included lumpectomy versus a mastectomy. We discussed the performance of the lumpectomy with seed placement. We discussed a 5% chance of a positive margin requiring reexcision in the operating room. We also discussed that she will need radiation therapy if she undergoes lumpectomy. We discussed the mastectomy and the postoperative care for that as well. We discussed that there is no  difference in her survival whether she undergoes lumpectomy with radiation therapy or antiestrogen therapy versus a mastectomy. I do not think she needs mri. We discussed the risks of operation including bleeding, infection, possible reoperation. She understands her further therapy will be based on what her stages at the time

## 2014-09-19 NOTE — Progress Notes (Signed)
Teresa Aguilar  [phone] [phone]) [phone]     ID: Teresa Aguilar DOB: 03-15-1961  MR#: [id number]  BTD#:[id number]  Patient Care Team: Stephens Shire, MD as PCP - General (Family Medicine) PCP: Stephens Shire, MD GYN: [name] MD SU: [name] MD OTHER MD: [name] MD, Phylliss Bob MD, Jamie Kato MD  CHIEF COMPLAINT: Estrogen receptor positive breast cancer  CURRENT TREATMENT: Adjuvant chemotherapy   BREAST CANCER HISTORY: "Teresa Aguilar" had routine screening mammography January 2016 in Dr. Tonette Bihari office showing a possible change in the right breast. On 07/24/2014 at the breast Center she underwent right digital mammography and ultrasonography. The breast density was category A. In the lower inner quadrant of the right breast there was a 2 cm mass which was palpable by exam. Ultrasound confirmed a 1.5 cm irregular hypoechoic mass at the 3:30 o'clock position 12 cm from the nipple. There was no right axillary adenopathy.  Biopsy of the mass in question 07/24/2014 showed ([id number]) invasive ductal carcinoma, grade 2 or 3, estrogen receptor 98% positive, progesterone receptor 76% positive, both with strong staining intensity, with an MIB-1 of 66% and no HER-2 amplification by FISH.  The patient's case was discussed at the multidisciplinary breast cancer conference 08/08/2014 and it was felt the patient would benefit from breast conserving surgery and likely would need an Oncotype to decide on optimal systemic therapy. She would need radiation and hormones. The question of genetics counseling was also raised.  On 08/27/2014 the patient underwent right lumpectomy and sentinel lymph node sampling. The final pathology ([id number]) confirmed invasive ductal carcinoma, grade 3, measuring 1.9 cm. There was evidence of lymphovascular and perineural invasion. Margins were negative but close, the closest margin for the in situ component being 1 mm.  The single sentinel lymph node was negative. Repeat HER-2 was again negative, with a signals ratio of 0.97 and a number per cell of 1.60.  The patient's subsequent history is as detailed below  INTERVAL HISTORY: Teresa Aguilar returns today for follow up of her breast cancer, accompanied by a friend. She had a port placed earlier this morning, and is here today to discuss beginning chemotherapy next week.  REVIEW OF SYSTEMS: Teresa Aguilar is tender from her very recent surgery, and plans to use tylenol #3 from a previous surgery instead of the oxycodone she was prescribed. She has a mild cough from being freshly out of the operating room. Her only complaints today are chronic migraines and hip pain, neither of which are of much concern today. She denies fevers, chills, nausea, or vomiting. She has a history of loose stools. A detailed review of systems is otherwise stable.  PAST MEDICAL HISTORY: Past Medical History  Diagnosis Date  . Hypoxia     after surgery  . DVT (deep venous thrombosis) 03/2014    R leg, post op- on Xarelto   . Hypertension     pt. reports that she has been higher in the past & again now but doesn't take any med.,never has for ^BP, pt. stating that BP is high now b/c of her pain in her back.    . H/O cardiovascular stress test 2012     test done for stress from her Father dying, had chest pain ,pt. had a stress/echo  test, told it was wnl  . Anxiety     relative for to pain & frustration of prev. hospitalization   . PONV (postoperative nausea and vomiting)     low O2  sats, < 50% post op  . Family history of adverse reaction to anesthesia     PONV  . Gestational diabetes 1986; 1996  . Migraines     "at least q other week" (08/27/2014)  . Arthritis     degenerative lumbar spine    . Chronic back pain   . Breast cancer of lower-inner quadrant of right female breast   . Colon cancer     PAST SURGICAL HISTORY: Past Surgical History  Procedure Laterality Date  . Endometrial  ablation  ~ 1998  . Colonoscopy w/ polypectomy  2015    found tubulovillous adenoma with focal high grade displasia  . Plantar fascia release Right ~ 2000  . Eye muscle surgery Bilateral ~ 1965    to fix cross-eye as child  . Sacroiliac joint fusion Right 02/22/2014    Procedure: SACROILIAC JOINT FUSION;  Surgeon: Sinclair Ship, MD;  Location: [facility];  Service: Orthopedics;  Laterality: Right;  Right sided sacroiliac joint fusion  . Radiofrequency ablation nerves      x 2 to nerves in her lower back  . Dental surgery  ~ 2011    "replaced bone graft upper jaw; put 2 implants in"  . Tubal ligation  1996  . Axillary sentinel node biopsy Right 08/27/2014    Archie Endo 08/27/2014  . Tonsillectomy  1988  . Excisional hemorrhoidectomy  2015  . Back surgery    . Breast biopsy Right 07/24/2014    core biopsy  . Breast lumpectomy Right 08/27/2014    Archie Endo 08/27/2014  . Radioactive seed guided mastectomy with axillary sentinel lymph node biopsy Right 08/27/2014    Procedure: RADIOACTIVE SEED GUIDED RIGHT BREAST LUMPECTOMY WITH RIGHT AXILLARY SENTINEL LYMPH NODE BIOPSY;  Surgeon: [name], MD;  Location: [facility];  Service: General;  Laterality: Right;    FAMILY HISTORY Family History  Problem Relation Age of Onset  . Breast cancer Cousin   . Breast cancer Maternal Aunt   . Breast cancer Paternal Aunt   . Heart disease Mother   . Heart disease Father   . Diabetes type II Father    the patient's father died at the age of 78 from heart failure in the setting of diabetes. The patient's mother is currently 43 years old. The patient has 3 brothers, 2 sisters. There is no history of breast, ovarian, or colon cancer in first-degree relatives. However, on the mother's side the patient has 3 relatives with breast cancer (a cousin diagnosed age 82, a second cousin diagnosed age 79, and a great aunt). There is also a history of colon cancer diagnosed age around age 24 in a great aunt and small  intestinal cancer in an aunt diagnosed age 50. On the father's side there are 2 cousins with breast cancer diagnosed age 54 and 4, and a cousin with colon cancer diagnosed age 68  GYNECOLOGIC HISTORY:  No LMP recorded. Patient has had an ablation. Menarche age 51, the patient carried 2 children to term, the first at age 36. After the first live birth she had a premature girl who died after one day (she had been a breech birth) and she also had 3 miscarriages. The patient underwent endometrial ablation in 1998 and has had no periods since that time.  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 Her husband Braulio Conte works for [employer] in the [place] and Manufacturing systems engineer. Son Harvie Bridge lives in [place] and  works in Education officer, community. He was an Gaffer) and son Lynelle Smoke is 2 and works for [employer]. He also lives in [place]. The patient has one granddaughter. The patient is a Psychologist, forensic    ADVANCED DIRECTIVES: Not in place   HEALTH MAINTENANCE: History  Substance Use Topics  . Smoking status: Never Smoker   . Smokeless tobacco: Never Used  . Alcohol Use: No     Colonoscopy: June 2015/May and last repeat planned 10/03/2014  PAP: 2015  Bone density:  Lipid panel:  Allergies  Allergen Reactions  . Morphine And Related Nausea And Vomiting  . Sulfa Antibiotics Hives    Current Outpatient Prescriptions  Medication Sig Dispense Refill  . eletriptan (RELPAX) 40 MG tablet Take 40 mg by mouth as needed for migraine or headache. One tablet by mouth at onset of headache. May repeat in 2 hours if headache persists or recurs.    Marland Kitchen loratadine (CLARITIN) 10 MG tablet Take 10 mg by mouth daily.    Marland Kitchen acetaminophen-codeine (TYLENOL #3) 300-30 MG per tablet Take 1 tablet by mouth every 8 (eight) hours as needed for moderate pain.   0  . oxyCODONE (OXY IR/ROXICODONE) 5 MG immediate  release tablet     . oxyCODONE-acetaminophen (PERCOCET) 10-325 MG per tablet Take 1 tablet by mouth every 6 (six) hours as needed for pain. (Patient not taking: Reported on 09/19/2014) 20 tablet 0  . rivaroxaban (XARELTO) 20 MG TABS tablet Take 20 mg by mouth every morning.     No current facility-administered medications for this visit.    OBJECTIVE: Middle-aged white woman who walks with difficulty Filed Vitals:   09/19/14 1412  BP: 124/75  Pulse: 83  Temp: 97.4 F (36.3 C)  Resp: 20     Body mass index is 43.91 kg/(m^2).    ECOG FS:2 - Symptomatic, <50% confined to bed  Skin: warm, dry  HEENT: sclerae anicteric, conjunctivae pink, oropharynx clear. No thrush or mucositis.  Lymph Nodes: No cervical or supraclavicular lymphadenopathy  Lungs: clear to auscultation bilaterally, no rales, wheezes, or rhonci  Heart: regular rate and rhythm  Abdomen: round, soft, non tender, positive bowel sounds  Musculoskeletal: No focal spinal tenderness, no peripheral edema  Neuro: non focal, well oriented, positive affect  Breasts: deferred  LAB RESULTS:  CMP     Component Value Date/Time   NA 141 09/19/2014 1331   NA 138 09/19/2014 0707   K 3.9 09/19/2014 1331   K 3.7 09/19/2014 0707   CL 103 09/19/2014 0707   CO2 19* 09/19/2014 1331   CO2 23 09/19/2014 0707   GLUCOSE 174* 09/19/2014 1331   GLUCOSE 133* 09/19/2014 0707   BUN 13.5 09/19/2014 1331   BUN 13 09/19/2014 0707   CREATININE 0.8 09/19/2014 1331   CREATININE 0.75 09/19/2014 0707   CALCIUM 8.7 09/19/2014 1331   CALCIUM 9.0 09/19/2014 0707   PROT 6.4 09/19/2014 1331   PROT 6.5 08/20/2014 1557   ALBUMIN 3.6 09/19/2014 1331   ALBUMIN 3.8 08/20/2014 1557   AST 12 09/19/2014 1331   AST 20 08/20/2014 1557   ALT 15 09/19/2014 1331   ALT 17 08/20/2014 1557   ALKPHOS 99 09/19/2014 1331   ALKPHOS 91 08/20/2014 1557   BILITOT 0.45 09/19/2014 1331   BILITOT 0.7 08/20/2014 1557   GFRNONAA >90 09/19/2014 0707   GFRAA >90 09/19/2014  0707    INo results found for: SPEP, UPEP  Lab Results  Component Value Date   WBC  8.3 09/19/2014   NEUTROABS 6.4 09/19/2014   HGB 13.3 09/19/2014   HCT 40.8 09/19/2014   MCV 90.9 09/19/2014   PLT 185 09/19/2014      Chemistry      Component Value Date/Time   NA 141 09/19/2014 1331   NA 138 09/19/2014 0707   K 3.9 09/19/2014 1331   K 3.7 09/19/2014 0707   CL 103 09/19/2014 0707   CO2 19* 09/19/2014 1331   CO2 23 09/19/2014 0707   BUN 13.5 09/19/2014 1331   BUN 13 09/19/2014 0707   CREATININE 0.8 09/19/2014 1331   CREATININE 0.75 09/19/2014 0707      Component Value Date/Time   CALCIUM 8.7 09/19/2014 1331   CALCIUM 9.0 09/19/2014 0707   ALKPHOS 99 09/19/2014 1331   ALKPHOS 91 08/20/2014 1557   AST 12 09/19/2014 1331   AST 20 08/20/2014 1557   ALT 15 09/19/2014 1331   ALT 17 08/20/2014 1557   BILITOT 0.45 09/19/2014 1331   BILITOT 0.7 08/20/2014 1557       No results found for: LABCA2  No components found for: LABCA125  No results for input(s): INR in the last 168 hours.  Urinalysis    Component Value Date/Time   COLORURINE AMBER* 02/21/2014 1540   APPEARANCEUR CLOUDY* 02/21/2014 1540   LABSPEC 1.029 02/21/2014 1540   PHURINE 5.0 02/21/2014 1540   GLUCOSEU NEGATIVE 02/21/2014 1540   HGBUR SMALL* 02/21/2014 1540   BILIRUBINUR SMALL* 02/21/2014 1540   KETONESUR 15* 02/21/2014 1540   PROTEINUR NEGATIVE 02/21/2014 1540   UROBILINOGEN 1.0 02/21/2014 1540   NITRITE POSITIVE* 02/21/2014 1540   LEUKOCYTESUR TRACE* 02/21/2014 1540    STUDIES: Nm Sentinel Node Inj-no Rpt (breast)  08/27/2014   CLINICAL DATA: right axillary sentinel node biopsy   Sulfur colloid was injected intradermally by the nuclear medicine  technologist for breast cancer sentinel node localization.    Mm Breast Surgical Specimen  08/27/2014   CLINICAL DATA:  Patient had radioactive seed localization for a biopsy-proven malignancy in the lower inner quadrant of the right breast.  EXAM:  SPECIMEN RADIOGRAPH OF THE RIGHT BREAST  COMPARISON:  Previous exam(s).  FINDINGS: Status post excision of the right breast. The radioactive seed and biopsy marker clip are present, completely intact, and were marked for pathology.  IMPRESSION: Specimen radiograph of the right breast.   Electronically Signed   By: Curlene Dolphin M.D.   On: 08/27/2014 08:41   Dg Chest Port 1 View  09/19/2014   CLINICAL DATA:  Port-A-Cath placement  EXAM: PORTABLE CHEST - 1 VIEW  COMPARISON:  02/21/2014  FINDINGS: There is a right-sided Port-A-Cath with the tip projecting over the SVC. There are low lung volumes with crowding of the interstitial markings. There is no focal parenchymal opacity, pleural effusion, or pneumothorax. The heart and mediastinal contours are unremarkable.  The osseous structures are unremarkable.  IMPRESSION: Right-sided Port-A-Cath with the tip projecting over the SVC. No pneumothorax.   Electronically Signed   By: Kathreen Devoid   On: 09/19/2014 12:14   Dg Fluoro Guide Cv Line-no Report  09/19/2014   CLINICAL DATA:    FLOURO GUIDE CV LINE  Fluoroscopy was utilized by the requesting physician.  No radiographic  interpretation.    Mm Rt Radioactive Seed Loc Mammo Guide  08/24/2014   CLINICAL DATA:  Right breast cancer for seed placement.  EXAM: MAMMOGRAPHIC GUIDED RADIOACTIVE SEED LOCALIZATION OF THE RIGHT BREAST  COMPARISON:  Previous exam(s)  FINDINGS: Patient presents for radioactive  seed localization prior to right breast surgery. I met with the patient and we discussed the procedure of seed localization including benefits and alternatives. We discussed the high likelihood of a successful procedure. We discussed the risks of the procedure including infection, bleeding, tissue injury and further surgery. We discussed the low dose of radioactivity involved in the procedure. Informed, written consent was given.  The usual time-out protocol was performed immediately prior to the procedure.  Using  mammographic guidance, sterile technique, 2% lidocaine and an I-125 radioactive seed, masses and biopsy clip of medial right breast localized using a medial approach. The follow-up mammogram images confirm the seed in the expected location and are marked for Dr. Donne Hazel.  Follow-up survey of the patient confirms presence of radioactive seed.  Order number of I-125 seed:  [id number].  Total activity:  0.25 mCi  Reference Date: August 03, 2014  The patient tolerated the procedure well and was released from the [facility]. She was given instructions regarding seed removal.  IMPRESSION: Radioactive seed localization right breast. No apparent complications.   Electronically Signed   By: Abelardo Diesel M.D.   On: 08/24/2014 13:28    ASSESSMENT: 54 y.o. [place] woman status post right lumpectomy and sentinel lymph node sampling 08/27/2014 for a pT1c pN0, stage IA invasive ductal carcinoma, grade 3, estrogen and progesterone receptor positive, HER-2 negative, with an MIB-1 of 66%  (1) adjuvant chemotherapy will consist of cyclophosphamide and docetaxel every 21 days 4, with onpro support  (2) adjuvant radiation will follow chemotherapy  (3) antiestrogen therapy for 10 years will follow radiation  (4) genetics consultation has been requested  (5) a hypercoagulable panel will be sent with next lab draw  (6) a nutrition consult has been requested   PLAN: Teresa Aguilar and I spent about 25 minutes discussing her upcoming chemotherapy plans. She was made aware of potential side effects and toxicities of both cyclophosphamide and docetaxel. She attended chemotherapy school last week so a lot of this information was a review for her. She was made aware of her neulasta injections, including the fact that these will be administered with an onbody injection that she will need to wear until the device is no longer active.   Finally, we reviewed her antiemetic schedule, and she feels comfortable with every  medication listed as well as the indication and potential side effects. These prescriptions were sent to her pharmacy today.   Teresa Aguilar will return on Monday for her first cycle of treatment. She understands and agrees with this plan. She knows the goal of treatment in her case is cure. She has been encouraged to call with any issues that might arise before her next visit here.  Laurie Panda, NP   09/19/2014 3:07 PM

## 2014-09-19 NOTE — Op Note (Signed)
Preoperative diagnosis: Breast cancer, needs venous access for chemotherapy Postoperative diagnosis: Same as above Procedure:Right  IJ ultrasound-guided PowerPort placement Surgeon: Dr. Serita Grammes Anesthesia: Gen. With LMA Estimated blood loss: Minimal Competitions: None Drains: None Specimens: None Sponge and needle count was correct at completion This patient to recovery stable  Indications: This a 35 female whose undergone breast conservation therapy for a right breast cancer. She has been determined to need chemotherapy. I discussed port placement prior to beginning for venous access.  Procedure: After informed consent was obtained the patient was taken the operating room. She was given 3 g of cefazolin. Sponge compression devices were in place. She was placed under general anesthesia with an LMA. Her bilateral chest and neck was then prepped and draped in the standard sterile surgical fashion. A surgical timeout was then performed.  I made a couple attempts to access her left subclavian vein.This was difficult so I decided to abort this. I then used the ultrasound and identified the right internal jugular vein. I easily accessed this and place the wire. This was confirmed by fluoroscopy. I then made a pocket on the right chest with a port. I tunneled the line between the 2 sites.I then placed the dilator and peel-away sheath. The line was placed. I then removed the peel-away sheath. I pulled the line back to be in the distal cava. I then connected this to the port. I sutured the port into position with 2-0 Prolene suture. This flushed easily and aspirated blood. I placed heparin in the port. Final fluoroscopy confirmed that the port and the line while in good position without any kinks. The tip was in good position also. I then closed with 3-0 Vicryl, 4-0 Monocryl, and glue. She tolerated this well and was transferred recovery room.

## 2014-09-19 NOTE — Transfer of Care (Signed)
Immediate Anesthesia Transfer of Care Note  Patient: Teresa Aguilar  Procedure(s) Performed: Procedure(s): INSERTION PORT-A-CATH (N/A)  Patient Location: PACU  Anesthesia Type:General  Level of Consciousness: awake, alert , oriented and patient cooperative  Airway & Oxygen Therapy: Patient Spontanous Breathing and Patient connected to face mask oxygen  Post-op Assessment: Report given to RN, Post -op Vital signs reviewed and stable and Patient moving all extremities X 4  Post vital signs: Reviewed and stable  Last Vitals:  Filed Vitals:   09/19/14 0650  BP: 153/92  Pulse: 88  Temp: 36.5 C  Resp: 16    Complications: No apparent anesthesia complications

## 2014-09-19 NOTE — Discharge Instructions (Signed)
    PORT-A-CATH: POST OP INSTRUCTIONS  Always review your discharge instruction sheet given to you by the facility where your surgery was performed.   1. A prescription for pain medication may be given to you upon discharge. Take your pain medication as prescribed, if needed. If narcotic pain medicine is not needed, then you make take acetaminophen (Tylenol) or ibuprofen (Advil) as needed.  2. Take your usually prescribed medications unless otherwise directed. 3. If you need a refill on your pain medication, please contact our office. All narcotic pain medicine now requires a paper prescription.  Phoned in and fax refills are no longer allowed by law.  Prescriptions will not be filled after 5 pm or on weekends.  4. You should follow a light diet for the remainder of the day after your procedure. 5. Most patients will experience some mild swelling and/or bruising in the area of the incision. It may take several days to resolve. 6. It is common to experience some constipation if taking pain medication after surgery. Increasing fluid intake and taking a stool softener (such as Colace) will usually help or prevent this problem from occurring. A mild laxative (Milk of Magnesia or Miralax) should be taken according to package directions if there are no bowel movements after 48 hours.  7. Unless discharge instructions indicate otherwise, you may remove your bandages 48 hours after surgery, and you may shower at that time. You may have steri-strips (small white skin tapes) in place directly over the incision.  These strips should be left on the skin for 7-10 days.  If your surgeon used Dermabond (skin glue) on the incision, you may shower in 24 hours.  The glue will flake off over the next 2-3 weeks.  8. If your port is left accessed at the end of surgery (needle left in port), the dressing cannot get wet and should only by changed by a healthcare professional. When the port is no longer accessed (when the  needle has been removed), follow step 7.   9. ACTIVITIES:  Limit activity involving your arms for the next 72 hours. Do no strenuous exercise or activity for 1 week. You may drive when you are no longer taking prescription pain medication, you can comfortably wear a seatbelt, and you can maneuver your car. 10.You may need to see your doctor in the office for a follow-up appointment.  Please       check with your doctor.  11.When you receive a new Port-a-Cath, you will get a product guide and        ID card.  Please keep them in case you need them.  WHEN TO CALL YOUR DOCTOR ([phone]): 1. Fever over 101.0 2. Chills 3. Continued bleeding from incision 4. Increased redness and tenderness at the site 5. Shortness of breath, difficulty breathing   The clinic staff is available to answer your questions during regular business hours. Please don't hesitate to call and ask to speak to one of the nurses or medical assistants for clinical concerns. If you have a medical emergency, go to the nearest emergency room or call 911.  A surgeon from [facility] is always on call at the hospital.     For further information, please visit [email or web address]      

## 2014-09-20 ENCOUNTER — Encounter (HOSPITAL_COMMUNITY): Payer: Self-pay | Admitting: General Surgery

## 2014-09-20 ENCOUNTER — Telehealth: Payer: Self-pay | Admitting: Nurse Practitioner

## 2014-09-20 NOTE — Telephone Encounter (Signed)
per pof to add pt flush appts-pt has MY CHART and will review updated sch

## 2014-09-23 LAB — HYPERCOAGULABLE PANEL, COMPREHENSIVE
ANTICARDIOLIPIN IGG: 7 GPL U/mL (ref <23)
AntiThromb III Func: 89 % (ref 76–126)
Anticardiolipin IgA: 0 APL U/mL (ref <22)
Anticardiolipin IgM: 3 MPL U/mL (ref <11)
BETA 2 GLYCO I IGG: 9 G Units (ref <20)
Beta-2-Glycoprotein I IgA: 5 A Units (ref <20)
Beta-2-Glycoprotein I IgM: 7 M Units (ref <20)
DRVVT: 36.1 secs (ref <42.9)
LUPUS ANTICOAGULANT: NOT DETECTED
PROTEIN C ACTIVITY: 153 % — AB (ref 75–133)
PROTEIN S TOTAL: 104 % (ref 60–150)
PTT Lupus Anticoagulant: 34 secs (ref 28.0–43.0)
Protein C, Total: 113 % (ref 72–160)
Protein S Activity: 106 % (ref 69–129)

## 2014-09-24 ENCOUNTER — Other Ambulatory Visit: Payer: Self-pay | Admitting: Oncology

## 2014-09-24 ENCOUNTER — Ambulatory Visit: Payer: BC Managed Care – PPO

## 2014-09-24 ENCOUNTER — Encounter: Payer: Self-pay | Admitting: *Deleted

## 2014-09-24 ENCOUNTER — Other Ambulatory Visit (HOSPITAL_BASED_OUTPATIENT_CLINIC_OR_DEPARTMENT_OTHER): Payer: BC Managed Care – PPO

## 2014-09-24 ENCOUNTER — Other Ambulatory Visit: Payer: Self-pay | Admitting: Nurse Practitioner

## 2014-09-24 ENCOUNTER — Other Ambulatory Visit: Payer: Self-pay

## 2014-09-24 ENCOUNTER — Ambulatory Visit (HOSPITAL_BASED_OUTPATIENT_CLINIC_OR_DEPARTMENT_OTHER): Payer: BC Managed Care – PPO

## 2014-09-24 DIAGNOSIS — Z5111 Encounter for antineoplastic chemotherapy: Secondary | ICD-10-CM

## 2014-09-24 DIAGNOSIS — G43109 Migraine with aura, not intractable, without status migrainosus: Secondary | ICD-10-CM

## 2014-09-24 DIAGNOSIS — C50311 Malignant neoplasm of lower-inner quadrant of right female breast: Secondary | ICD-10-CM | POA: Diagnosis not present

## 2014-09-24 DIAGNOSIS — M532X8 Spinal instabilities, sacral and sacrococcygeal region: Secondary | ICD-10-CM

## 2014-09-24 DIAGNOSIS — Z95828 Presence of other vascular implants and grafts: Secondary | ICD-10-CM

## 2014-09-24 DIAGNOSIS — Z6841 Body Mass Index (BMI) 40.0 and over, adult: Secondary | ICD-10-CM

## 2014-09-24 DIAGNOSIS — Z5189 Encounter for other specified aftercare: Secondary | ICD-10-CM | POA: Diagnosis not present

## 2014-09-24 DIAGNOSIS — I82491 Acute embolism and thrombosis of other specified deep vein of right lower extremity: Secondary | ICD-10-CM

## 2014-09-24 LAB — CBC WITH DIFFERENTIAL/PLATELET
BASO%: 0 % (ref 0.0–2.0)
Basophils Absolute: 0 10*3/uL (ref 0.0–0.1)
EOS ABS: 0 10*3/uL (ref 0.0–0.5)
EOS%: 0 % (ref 0.0–7.0)
HEMATOCRIT: 41.7 % (ref 34.8–46.6)
HGB: 14 g/dL (ref 11.6–15.9)
LYMPH#: 0.8 10*3/uL — AB (ref 0.9–3.3)
LYMPH%: 6.4 % — AB (ref 14.0–49.7)
MCH: 30.7 pg (ref 25.1–34.0)
MCHC: 33.6 g/dL (ref 31.5–36.0)
MCV: 91.4 fL (ref 79.5–101.0)
MONO#: 0.4 10*3/uL (ref 0.1–0.9)
MONO%: 3.4 % (ref 0.0–14.0)
NEUT%: 90.2 % — AB (ref 38.4–76.8)
NEUTROS ABS: 11.3 10*3/uL — AB (ref 1.5–6.5)
NRBC: 0 % (ref 0–0)
PLATELETS: 218 10*3/uL (ref 145–400)
RBC: 4.56 10*6/uL (ref 3.70–5.45)
RDW: 14.7 % — ABNORMAL HIGH (ref 11.2–14.5)
WBC: 12.6 10*3/uL — AB (ref 3.9–10.3)

## 2014-09-24 MED ORDER — SODIUM CHLORIDE 0.9 % IJ SOLN
10.0000 mL | INTRAMUSCULAR | Status: DC | PRN
  Administered 2014-09-24: 10 mL via INTRAVENOUS
  Filled 2014-09-24: qty 10

## 2014-09-24 MED ORDER — SODIUM CHLORIDE 0.9 % IV SOLN
600.0000 mg/m2 | Freq: Once | INTRAVENOUS | Status: AC
  Administered 2014-09-24: 1480 mg via INTRAVENOUS
  Filled 2014-09-24: qty 74

## 2014-09-24 MED ORDER — PEGFILGRASTIM 6 MG/0.6ML ~~LOC~~ PSKT
6.0000 mg | PREFILLED_SYRINGE | Freq: Once | SUBCUTANEOUS | Status: AC
  Administered 2014-09-24: 6 mg via SUBCUTANEOUS
  Filled 2014-09-24: qty 0.6

## 2014-09-24 MED ORDER — SODIUM CHLORIDE 0.9 % IV SOLN
Freq: Once | INTRAVENOUS | Status: AC
  Administered 2014-09-24: 13:00:00 via INTRAVENOUS
  Filled 2014-09-24: qty 8

## 2014-09-24 MED ORDER — SODIUM CHLORIDE 0.9 % IV SOLN
Freq: Once | INTRAVENOUS | Status: AC
  Administered 2014-09-24: 12:00:00 via INTRAVENOUS

## 2014-09-24 MED ORDER — HEPARIN SOD (PORK) LOCK FLUSH 100 UNIT/ML IV SOLN
500.0000 [IU] | Freq: Once | INTRAVENOUS | Status: AC | PRN
  Administered 2014-09-24: 500 [IU]
  Filled 2014-09-24: qty 5

## 2014-09-24 MED ORDER — DOCETAXEL CHEMO INJECTION 160 MG/16ML
75.0000 mg/m2 | Freq: Once | INTRAVENOUS | Status: AC
  Administered 2014-09-24: 180 mg via INTRAVENOUS
  Filled 2014-09-24: qty 18

## 2014-09-24 MED ORDER — SODIUM CHLORIDE 0.9 % IJ SOLN
10.0000 mL | INTRAMUSCULAR | Status: DC | PRN
  Administered 2014-09-24: 10 mL
  Filled 2014-09-24: qty 10

## 2014-09-24 NOTE — Patient Instructions (Signed)
[facility] Discharge Instructions for Patients Receiving Chemotherapy  Today you received the following chemotherapy agents taxotere/cytoxan  To help prevent nausea and vomiting after your treatment, we encourage you to take your nausea medication as directed   If you develop nausea and vomiting that is not controlled by your nausea medication, call the clinic.   BELOW ARE SYMPTOMS THAT SHOULD BE REPORTED IMMEDIATELY:  *FEVER GREATER THAN 100.5 F  *CHILLS WITH OR WITHOUT FEVER  NAUSEA AND VOMITING THAT IS NOT CONTROLLED WITH YOUR NAUSEA MEDICATION  *UNUSUAL SHORTNESS OF BREATH  *UNUSUAL BRUISING OR BLEEDING  TENDERNESS IN MOUTH AND THROAT WITH OR WITHOUT PRESENCE OF ULCERS  *URINARY PROBLEMS  *BOWEL PROBLEMS  UNUSUAL RASH Items with * indicate a potential emergency and should be followed up as soon as possible.  Feel free to call the clinic you have any questions or concerns. The clinic phone number is [phone].  Docetaxel injection What is this medicine? DOCETAXEL (doe se TAX el) is a chemotherapy drug. It targets fast dividing cells, like cancer cells, and causes these cells to die. This medicine is used to treat many types of cancers like breast cancer, certain stomach cancers, head and neck cancer, lung cancer, and prostate cancer. This medicine may be used for other purposes; ask your health care provider or pharmacist if you have questions. COMMON BRAND NAME(S): Docefrez, Taxotere What should I tell my health care provider before I take this medicine? They need to know if you have any of these conditions: -infection (especially a virus infection such as chickenpox, cold sores, or herpes) -liver disease -low blood counts, like low white cell, platelet, or red cell counts -an unusual or allergic reaction to docetaxel, polysorbate 80, other chemotherapy agents, other medicines, foods, dyes, or preservatives -pregnant or trying to get  pregnant -breast-feeding How should I use this medicine? This drug is given as an infusion into a vein. It is administered in a hospital or clinic by a specially trained health care professional. Talk to your pediatrician regarding the use of this medicine in children. Special care may be needed. Overdosage: If you think you have taken too much of this medicine contact a poison control center or emergency room at once. NOTE: This medicine is only for you. Do not share this medicine with others. What if I miss a dose? It is important not to miss your dose. Call your doctor or health care professional if you are unable to keep an appointment. What may interact with this medicine? -cyclosporine -erythromycin -ketoconazole -medicines to increase blood counts like filgrastim, pegfilgrastim, sargramostim -vaccines Talk to your doctor or health care professional before taking any of these medicines: -acetaminophen -aspirin -ibuprofen -ketoprofen -naproxen This list may not describe all possible interactions. Give your health care provider a list of all the medicines, herbs, non-prescription drugs, or dietary supplements you use. Also tell them if you smoke, drink alcohol, or use illegal drugs. Some items may interact with your medicine. What should I watch for while using this medicine? Your condition will be monitored carefully while you are receiving this medicine. You will need important blood work done while you are taking this medicine. This drug may make you feel generally unwell. This is not uncommon, as chemotherapy can affect healthy cells as well as cancer cells. Report any side effects. Continue your course of treatment even though you feel ill unless your doctor tells you to stop. In some cases, you may be given additional medicines to help  with side effects. Follow all directions for their use. Call your doctor or health care professional for advice if you get a fever, chills or sore  throat, or other symptoms of a cold or flu. Do not treat yourself. This drug decreases your body's ability to fight infections. Try to avoid being around people who are sick. This medicine may increase your risk to bruise or bleed. Call your doctor or health care professional if you notice any unusual bleeding. Be careful brushing and flossing your teeth or using a toothpick because you may get an infection or bleed more easily. If you have any dental work done, tell your dentist you are receiving this medicine. Avoid taking products that contain aspirin, acetaminophen, ibuprofen, naproxen, or ketoprofen unless instructed by your doctor. These medicines may hide a fever. This medicine contains an alcohol in the product. You may get drowsy or dizzy. Do not drive, use machinery, or do anything that needs mental alertness until you know how this medicine affects you. Do not stand or sit up quickly, especially if you are an older patient. This reduces the risk of dizzy or fainting spells. Avoid alcoholic drinks Do not become pregnant while taking this medicine. Women should inform their doctor if they wish to become pregnant or think they might be pregnant. There is a potential for serious side effects to an unborn child. Talk to your health care professional or pharmacist for more information. Do not breast-feed an infant while taking this medicine. What side effects may I notice from receiving this medicine? Side effects that you should report to your doctor or health care professional as soon as possible: -allergic reactions like skin rash, itching or hives, swelling of the face, lips, or tongue -low blood counts - This drug may decrease the number of white blood cells, red blood cells and platelets. You may be at increased risk for infections and bleeding. -signs of infection - fever or chills, cough, sore throat, pain or difficulty passing urine -signs of decreased platelets or bleeding - bruising,  pinpoint red spots on the skin, black, tarry stools, nosebleeds -signs of decreased red blood cells - unusually weak or tired, fainting spells, lightheadedness -breathing problems -fast or irregular heartbeat -low blood pressure -mouth sores -nausea and vomiting -pain, swelling, redness or irritation at the injection site -pain, tingling, numbness in the hands or feet -swelling of the ankle, feet, hands -weight gain Side effects that usually do not require medical attention (report to your prescriber or health care professional if they continue or are bothersome): -bone pain -complete hair loss including hair on your head, underarms, pubic hair, eyebrows, and eyelashes -diarrhea -excessive tearing -changes in the color of fingernails -loosening of the fingernails -nausea -muscle pain -red flush to skin -sweating -weak or tired This list may not describe all possible side effects. Call your doctor for medical advice about side effects. You may report side effects to FDA at 1-800-FDA-1088. Where should I keep my medicine? This drug is given in a hospital or clinic and will not be stored at home. NOTE: This sheet is a summary. It may not cover all possible information. If you have questions about this medicine, talk to your doctor, pharmacist, or health care provider.  2015, Elsevier/Gold Standard. (2013-04-27 22:21:02)  Cyclophosphamide injection What is this medicine? CYCLOPHOSPHAMIDE (sye kloe FOSS fa mide) is a chemotherapy drug. It slows the growth of cancer cells. This medicine is used to treat many types of cancer like lymphoma, myeloma, leukemia, breast  cancer, and ovarian cancer, to name a few. This medicine may be used for other purposes; ask your health care provider or pharmacist if you have questions. COMMON BRAND NAME(S): Cytoxan, Neosar What should I tell my health care provider before I take this medicine? They need to know if you have any of these conditions: -blood  disorders -history of other chemotherapy -infection -kidney disease -liver disease -recent or ongoing radiation therapy -tumors in the bone marrow -an unusual or allergic reaction to cyclophosphamide, other chemotherapy, other medicines, foods, dyes, or preservatives -pregnant or trying to get pregnant -breast-feeding How should I use this medicine? This drug is usually given as an injection into a vein or muscle or by infusion into a vein. It is administered in a hospital or clinic by a specially trained health care professional. Talk to your pediatrician regarding the use of this medicine in children. Special care may be needed. Overdosage: If you think you have taken too much of this medicine contact a poison control center or emergency room at once. NOTE: This medicine is only for you. Do not share this medicine with others. What if I miss a dose? It is important not to miss your dose. Call your doctor or health care professional if you are unable to keep an appointment. What may interact with this medicine? This medicine may interact with the following medications: -amiodarone -amphotericin B -azathioprine -certain antiviral medicines for HIV or AIDS such as protease inhibitors (e.g., indinavir, ritonavir) and zidovudine -certain blood pressure medications such as benazepril, captopril, enalapril, fosinopril, lisinopril, moexipril, monopril, perindopril, quinapril, ramipril, trandolapril -certain cancer medications such as anthracyclines (e.g., daunorubicin, doxorubicin), busulfan, cytarabine, paclitaxel, pentostatin, tamoxifen, trastuzumab -certain diuretics such as chlorothiazide, chlorthalidone, hydrochlorothiazide, indapamide, metolazone -certain medicines that treat or prevent blood clots like warfarin -certain muscle relaxants such as succinylcholine -cyclosporine -etanercept -indomethacin -medicines to increase blood counts like filgrastim, pegfilgrastim,  sargramostim -medicines used as general anesthesia -metronidazole -natalizumab This list may not describe all possible interactions. Give your health care provider a list of all the medicines, herbs, non-prescription drugs, or dietary supplements you use. Also tell them if you smoke, drink alcohol, or use illegal drugs. Some items may interact with your medicine. What should I watch for while using this medicine? Visit your doctor for checks on your progress. This drug may make you feel generally unwell. This is not uncommon, as chemotherapy can affect healthy cells as well as cancer cells. Report any side effects. Continue your course of treatment even though you feel ill unless your doctor tells you to stop. Drink water or other fluids as directed. Urinate often, even at night. In some cases, you may be given additional medicines to help with side effects. Follow all directions for their use. Call your doctor or health care professional for advice if you get a fever, chills or sore throat, or other symptoms of a cold or flu. Do not treat yourself. This drug decreases your body's ability to fight infections. Try to avoid being around people who are sick. This medicine may increase your risk to bruise or bleed. Call your doctor or health care professional if you notice any unusual bleeding. Be careful brushing and flossing your teeth or using a toothpick because you may get an infection or bleed more easily. If you have any dental work done, tell your dentist you are receiving this medicine. You may get drowsy or dizzy. Do not drive, use machinery, or do anything that needs mental alertness until you know  how this medicine affects you. Do not become pregnant while taking this medicine or for 1 year after stopping it. Women should inform their doctor if they wish to become pregnant or think they might be pregnant. Men should not father a child while taking this medicine and for 4 months after stopping  it. There is a potential for serious side effects to an unborn child. Talk to your health care professional or pharmacist for more information. Do not breast-feed an infant while taking this medicine. This medicine may interfere with the ability to have a child. This medicine has caused ovarian failure in some women. This medicine has caused reduced sperm counts in some men. You should talk with your doctor or health care professional if you are concerned about your fertility. If you are going to have surgery, tell your doctor or health care professional that you have taken this medicine. What side effects may I notice from receiving this medicine? Side effects that you should report to your doctor or health care professional as soon as possible: -allergic reactions like skin rash, itching or hives, swelling of the face, lips, or tongue -low blood counts - this medicine may decrease the number of white blood cells, red blood cells and platelets. You may be at increased risk for infections and bleeding. -signs of infection - fever or chills, cough, sore throat, pain or difficulty passing urine -signs of decreased platelets or bleeding - bruising, pinpoint red spots on the skin, black, tarry stools, blood in the urine -signs of decreased red blood cells - unusually weak or tired, fainting spells, lightheadedness -breathing problems -dark urine -dizziness -palpitations -swelling of the ankles, feet, hands -trouble passing urine or change in the amount of urine -weight gain -yellowing of the eyes or skin Side effects that usually do not require medical attention (report to your doctor or health care professional if they continue or are bothersome): -changes in nail or skin color -hair loss -missed menstrual periods -mouth sores -nausea, vomiting This list may not describe all possible side effects. Call your doctor for medical advice about side effects. You may report side effects to FDA at  1-800-FDA-1088. Where should I keep my medicine? This drug is given in a hospital or clinic and will not be stored at home. NOTE: This sheet is a summary. It may not cover all possible information. If you have questions about this medicine, talk to your doctor, pharmacist, or health care provider.  2015, Elsevier/Gold Standard. (2012-04-15 16:22:58)

## 2014-09-24 NOTE — Patient Instructions (Signed)

## 2014-09-24 NOTE — Progress Notes (Signed)
Pt tolerated first taxotere/cytoxan without problems.  On pro applied, pt information given.

## 2014-09-24 NOTE — Progress Notes (Signed)
Met with pt during 1st chemo treatment. Relate she is doing well. Denies needs or questions at this time. Pt has future appts and anti-nausea meds. Encourage pt to call with concerns. Received verbal understanding.

## 2014-09-25 ENCOUNTER — Other Ambulatory Visit: Payer: Self-pay | Admitting: Nurse Practitioner

## 2014-09-26 ENCOUNTER — Ambulatory Visit: Payer: Self-pay

## 2014-09-26 ENCOUNTER — Encounter: Payer: Self-pay | Admitting: Genetic Counselor

## 2014-09-26 ENCOUNTER — Other Ambulatory Visit (HOSPITAL_BASED_OUTPATIENT_CLINIC_OR_DEPARTMENT_OTHER): Payer: BC Managed Care – PPO

## 2014-09-26 ENCOUNTER — Other Ambulatory Visit: Payer: Self-pay

## 2014-09-26 ENCOUNTER — Ambulatory Visit (HOSPITAL_BASED_OUTPATIENT_CLINIC_OR_DEPARTMENT_OTHER): Payer: BC Managed Care – PPO

## 2014-09-26 ENCOUNTER — Ambulatory Visit (HOSPITAL_BASED_OUTPATIENT_CLINIC_OR_DEPARTMENT_OTHER): Payer: BC Managed Care – PPO | Admitting: Genetic Counselor

## 2014-09-26 VITALS — BP 145/80 | HR 73 | Temp 97.7°F

## 2014-09-26 DIAGNOSIS — Z6841 Body Mass Index (BMI) 40.0 and over, adult: Secondary | ICD-10-CM

## 2014-09-26 DIAGNOSIS — Z800 Family history of malignant neoplasm of digestive organs: Secondary | ICD-10-CM | POA: Insufficient documentation

## 2014-09-26 DIAGNOSIS — C50919 Malignant neoplasm of unspecified site of unspecified female breast: Secondary | ICD-10-CM | POA: Insufficient documentation

## 2014-09-26 DIAGNOSIS — C50311 Malignant neoplasm of lower-inner quadrant of right female breast: Secondary | ICD-10-CM

## 2014-09-26 DIAGNOSIS — M532X8 Spinal instabilities, sacral and sacrococcygeal region: Secondary | ICD-10-CM

## 2014-09-26 DIAGNOSIS — Z8042 Family history of malignant neoplasm of prostate: Secondary | ICD-10-CM

## 2014-09-26 DIAGNOSIS — Z803 Family history of malignant neoplasm of breast: Secondary | ICD-10-CM | POA: Insufficient documentation

## 2014-09-26 DIAGNOSIS — Z95828 Presence of other vascular implants and grafts: Secondary | ICD-10-CM

## 2014-09-26 DIAGNOSIS — I82491 Acute embolism and thrombosis of other specified deep vein of right lower extremity: Secondary | ICD-10-CM

## 2014-09-26 DIAGNOSIS — G43109 Migraine with aura, not intractable, without status migrainosus: Secondary | ICD-10-CM

## 2014-09-26 LAB — CBC WITH DIFFERENTIAL/PLATELET
BASO%: 0.4 % (ref 0.0–2.0)
Basophils Absolute: 0.1 10*3/uL (ref 0.0–0.1)
EOS%: 0.9 % (ref 0.0–7.0)
Eosinophils Absolute: 0.2 10*3/uL (ref 0.0–0.5)
HCT: 41.4 % (ref 34.8–46.6)
HGB: 13.5 g/dL (ref 11.6–15.9)
LYMPH%: 2.1 % — AB (ref 14.0–49.7)
MCH: 29.3 pg (ref 25.1–34.0)
MCHC: 32.5 g/dL (ref 31.5–36.0)
MCV: 90.1 fL (ref 79.5–101.0)
MONO#: 0.2 10*3/uL (ref 0.1–0.9)
MONO%: 1.2 % (ref 0.0–14.0)
NEUT#: 16 10*3/uL — ABNORMAL HIGH (ref 1.5–6.5)
NEUT%: 95.4 % — ABNORMAL HIGH (ref 38.4–76.8)
PLATELETS: 175 10*3/uL (ref 145–400)
RBC: 4.6 10*6/uL (ref 3.70–5.45)
RDW: 14.9 % — ABNORMAL HIGH (ref 11.2–14.5)
WBC: 16.8 10*3/uL — AB (ref 3.9–10.3)
lymph#: 0.4 10*3/uL — ABNORMAL LOW (ref 0.9–3.3)

## 2014-09-26 LAB — COMPREHENSIVE METABOLIC PANEL
ALK PHOS: 83 U/L (ref 39–117)
ALT: 13 U/L (ref 0–35)
AST: 15 U/L (ref 0–37)
Albumin: 3.9 g/dL (ref 3.5–5.2)
BUN: 21 mg/dL (ref 6–23)
CO2: 24 mEq/L (ref 19–32)
CREATININE: 0.72 mg/dL (ref 0.50–1.10)
Calcium: 8.6 mg/dL (ref 8.4–10.5)
Chloride: 105 mEq/L (ref 96–112)
Glucose, Bld: 220 mg/dL — ABNORMAL HIGH (ref 70–99)
Potassium: 4 mEq/L (ref 3.5–5.3)
Sodium: 139 mEq/L (ref 135–145)
Total Bilirubin: 0.7 mg/dL (ref 0.2–1.2)
Total Protein: 6.4 g/dL (ref 6.0–8.3)

## 2014-09-26 MED ORDER — SODIUM CHLORIDE 0.9 % IJ SOLN
10.0000 mL | INTRAMUSCULAR | Status: DC | PRN
  Administered 2014-09-26: 10 mL via INTRAVENOUS
  Filled 2014-09-26: qty 10

## 2014-09-26 MED ORDER — HEPARIN SOD (PORK) LOCK FLUSH 100 UNIT/ML IV SOLN
500.0000 [IU] | Freq: Once | INTRAVENOUS | Status: AC
  Administered 2014-09-26: 500 [IU] via INTRAVENOUS
  Filled 2014-09-26: qty 5

## 2014-09-26 NOTE — Patient Instructions (Signed)

## 2014-09-26 NOTE — Progress Notes (Signed)
REFERRING PROVIDER: Stephens Shire, MD [address] [place], [place] [address]   Teresa Del, MD  PRIMARY PROVIDER:  Stephens Shire, MD  PRIMARY REASON FOR VISIT:  1. Carcinoma of lower-inner quadrant of right breast   2. Family history of breast cancer   3. Family history of colon cancer   4. Family history of prostate cancer      HISTORY OF PRESENT ILLNESS:   Teresa Aguilar, a 55 y.o. female, was seen for a [facility] cancer genetics consultation at the request of Dr. Jana Hakim due to a personal and family history of cancer.  Teresa Aguilar presents to clinic today to discuss the possibility of a hereditary predisposition to cancer, genetic testing, and to further clarify her future cancer risks, as well as potential cancer risks for family members.   In 2015, at the age of 74, Teresa Aguilar was diagnosed with high grade adenoma of the colon This was treated with surgery.  In 2016 at the age of 5, Teresa Aguilar was diagnosed with breast cancer.  This will be treated with chemotherapy, surgery and tamoxifen.    CANCER HISTORY:   No history exists.     HORMONAL RISK FACTORS:  Menarche was at age 47.  First live birth at age 1.  OCP use for approximately 1 years.  Ovaries intact: yes.  Hysterectomy: no.  Menopausal status: postmenopausal.  HRT use: 0 years. Colonoscopy: yes; abnormal. Mammogram within the last year: yes. Number of breast biopsies: 1. Up to date with pelvic exams:  yes. Any excessive radiation exposure in the past:  no  Past Medical History  Diagnosis Date  . Hypoxia     after surgery  . DVT (deep venous thrombosis) 03/2014    R leg, post op- on Xarelto   . Hypertension     pt. reports that she has been higher in the past & again now but doesn't take any med.,never has for ^BP, pt. stating that BP is high now b/c of her pain in her back.    . H/O cardiovascular stress test 2012     test done for stress from her Father dying, had chest  pain ,pt. had a stress/echo  test, told it was wnl  . Anxiety     relative for to pain & frustration of prev. hospitalization   . PONV (postoperative nausea and vomiting)     low O2 sats, < 50% post op  . Family history of adverse reaction to anesthesia     PONV  . Gestational diabetes 1986; 1996  . Migraines     "at least q other week" (08/27/2014)  . Arthritis     degenerative lumbar spine    . Chronic back pain   . Breast cancer of lower-inner quadrant of right female breast   . Colon cancer   . Breast cancer 2016  . Family history of breast cancer   . Family history of colon cancer     Past Surgical History  Procedure Laterality Date  . Endometrial ablation  ~ 1998  . Colonoscopy w/ polypectomy  2015    found tubulovillous adenoma with focal high grade displasia  . Plantar fascia release Right ~ 2000  . Eye muscle surgery Bilateral ~ 1965    to fix cross-eye as child  . Sacroiliac joint fusion Right 02/22/2014    Procedure: SACROILIAC JOINT FUSION;  Surgeon: Sinclair Ship, MD;  Location: [facility];  Service: Orthopedics;  Laterality: Right;  Right sided sacroiliac joint fusion  . Radiofrequency ablation nerves      x 2 to nerves in her lower back  . Dental surgery  ~ 2011    "replaced bone graft upper jaw; put 2 implants in"  . Tubal ligation  1996  . Axillary sentinel node biopsy Right 08/27/2014    Archie Endo 08/27/2014  . Tonsillectomy  1988  . Excisional hemorrhoidectomy  2015  . Back surgery    . Breast biopsy Right 07/24/2014    core biopsy  . Breast lumpectomy Right 08/27/2014    Archie Endo 08/27/2014  . Radioactive seed guided mastectomy with axillary sentinel lymph node biopsy Right 08/27/2014    Procedure: RADIOACTIVE SEED GUIDED RIGHT BREAST LUMPECTOMY WITH RIGHT AXILLARY SENTINEL LYMPH NODE BIOPSY;  Surgeon: [name], MD;  Location: [facility];  Service: General;  Laterality: Right;  . Portacath placement N/A 09/19/2014    Procedure: INSERTION PORT-A-CATH;   Surgeon: [name], MD;  Location: [facility];  Service: General;  Laterality: N/A;    History   Social History  . Marital Status: Married    Spouse Name: N/A  . Number of Children: 2  . 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 Smoking status: Never Smoker   . Smokeless tobacco: Never Used  . Alcohol Use: No  . Drug Use: No  . Sexual Activity: Not Currently   Other Topics Concern  . None   Social History Narrative     FAMILY HISTORY:  We obtained a detailed, 4-generation family history.  Significant diagnoses are listed below: Family History  Problem Relation Age of Onset  . Breast cancer Cousin 29    maternal first cousin  . Colon cancer Maternal Aunt 75  . Throat cancer Maternal Aunt 75    smoker  . Breast cancer Paternal Aunt 4  . Heart disease Mother   . Heart disease Father   . Diabetes type II Father   . Stomach cancer Maternal Uncle     dx in his 14s  . Colon cancer Cousin 30     maternal first cousin  . Prostate cancer Cousin 27    paternal first cousin  . Breast cancer Paternal Aunt     dx in her 48s  . Breast cancer Other     mother's paternal first cousin  . Colon cancer Other 34    MGF's sister  . Brain cancer Other 67    MGFs sister  . Breast cancer Other     MGF's paternal aunt  . Prostate cancer Other     MGF's paternal first cousin   Teresa Aguilar has two boys ages 46 and 71.  She ha two sisters and three brothers who are all cancer free.  Her mother is one of seven children.  One sister had colon cancer and throat cancer dx at age 30, and one brother had stomach cancer dx in his 16s.  Teresa Aguilar has one maternal cousin with breast cancer at 3 and a cousin with colon cancer at 61.  Her maternal grandfather had a sister with colon and brain cancer, a niece with breast cancer, a paternal aunt with breast cancer and paternal cousin with prostate cancer.  Teresa Aguilar has two  paternal aunts with breast cancer and a paternal cousin who died of prostate cancer when he was 49. Patient's maternal ancestors are of [place] and [place] descent, and paternal ancestors  are of [place] descent. There is no reported Ashkenazi Jewish ancestry. There is no known consanguinity.  GENETIC COUNSELING ASSESSMENT: Teresa Aguilar is a 54 y.o. female with a personal and family history of cancer which somewhat suggestive of a hereditary cancer syndrome and predisposition to cancer. We, therefore, discussed and recommended the following at today's visit.   DISCUSSION: We reviewed the characteristics, features and inheritance patterns of hereditary cancer syndromes. We reviewed hereditary breast and colon cancer syndromes due to BRCA mutations as well as LYnch syndrome.  We also discussed genetic testing, including the appropriate family members to test, the process of testing, insurance coverage and turn-around-time for results. We discussed the implications of a negative, positive and/or variant of uncertain significant result. We recommended Ms. Holsonback pursue genetic testing for the Comprehensive cancer gene panel and the MSH2 inversion.  The Comprehensive Cancer Panel offered by [facility] includes sequencing and/or deletion duplication testing of the following 32 genes: APC, ATM, AXIN2, BARD1, BMPR1A, BRCA1, BRCA2, BRIP1, CDH1, CDK4, CDKN2A, CHEK2, EPCAM, FANCC, MLH1, MSH2, MSH6, MUTYH, NBN, PALB2, PMS2, POLD1, POLE, PTEN, RAD51C, RAD51D, SCG5/GREM1, SMAD4, STK11, TP53, VHL, and XRCC2.     PLAN: After considering the risks, benefits, and limitations, Ms. Cordner  provided informed consent to pursue genetic testing and the blood sample was sent to [facility] for analysis of the Comprehensive Cancer panel. Results should be available within approximately 2-3 weeks' time, at which point they will be disclosed by telephone to Ms. Meno, as will any additional recommendations warranted by these  results. Ms. Muenchow will receive a summary of her genetic counseling visit and a copy of her results once available. This information will also be available in Epic. We encouraged Ms. Oconnor to remain in contact with cancer genetics annually so that we can continuously update the family history and inform her of any changes in cancer genetics and testing that may be of benefit for her family. Ms. Zalenski questions were answered to her satisfaction today. Our contact information was provided should additional questions or concerns arise.  Lastly, we encouraged Ms. Barasch to remain in contact with cancer genetics annually so that we can continuously update the family history and inform her of any changes in cancer genetics and testing that may be of benefit for this family.   Ms.  Effertz questions were answered to her satisfaction today. Our contact information was provided should additional questions or concerns arise. Thank you for the referral and allowing [place] to share in the care of your patient.   Karen P. Florene Glen, [place], [facility] Certified Genetic Counselor [email or web address] phone: [phone]  The patient was seen for a total of 60 minutes in face-to-face genetic counseling.  This patient was discussed with Drs. Magrinat, Lindi Adie and/or Burr Medico who agrees with the above.    _______________________________________________________________________ For Office Staff:  Number of people involved in session: 2 Was an Intern/ student involved with case: yes

## 2014-09-27 ENCOUNTER — Telehealth: Payer: Self-pay | Admitting: *Deleted

## 2014-09-27 NOTE — Telephone Encounter (Signed)
TC from patient stating that she is experiencing increased muscle and bone pain after getting her OnPro neulasta injection on Tuesday. Pt. has taken her Claritin and tylenol but is still very uncomfortable. Is unable to take Ibuprofen as she is on a blood thinner Xarelto. Pt. Does have oxycodone 5 mg tablets available. Instructed pt. To take these as directed on bottle ( every 4-6 hours as needed). Encouraged patient to call back if this is not effective. Pt. Voiced understanding.

## 2014-10-01 ENCOUNTER — Encounter: Payer: Self-pay | Admitting: Nurse Practitioner

## 2014-10-01 ENCOUNTER — Other Ambulatory Visit: Payer: Self-pay

## 2014-10-01 ENCOUNTER — Other Ambulatory Visit (HOSPITAL_BASED_OUTPATIENT_CLINIC_OR_DEPARTMENT_OTHER): Payer: BC Managed Care – PPO

## 2014-10-01 ENCOUNTER — Ambulatory Visit (HOSPITAL_BASED_OUTPATIENT_CLINIC_OR_DEPARTMENT_OTHER): Payer: BC Managed Care – PPO | Admitting: Nurse Practitioner

## 2014-10-01 ENCOUNTER — Ambulatory Visit (HOSPITAL_BASED_OUTPATIENT_CLINIC_OR_DEPARTMENT_OTHER): Payer: BC Managed Care – PPO

## 2014-10-01 VITALS — BP 119/79 | HR 101 | Temp 98.0°F | Resp 20 | Wt 272.1 lb

## 2014-10-01 DIAGNOSIS — R112 Nausea with vomiting, unspecified: Secondary | ICD-10-CM | POA: Diagnosis not present

## 2014-10-01 DIAGNOSIS — Z6841 Body Mass Index (BMI) 40.0 and over, adult: Secondary | ICD-10-CM

## 2014-10-01 DIAGNOSIS — C50311 Malignant neoplasm of lower-inner quadrant of right female breast: Secondary | ICD-10-CM

## 2014-10-01 DIAGNOSIS — R630 Anorexia: Secondary | ICD-10-CM

## 2014-10-01 DIAGNOSIS — R21 Rash and other nonspecific skin eruption: Secondary | ICD-10-CM | POA: Insufficient documentation

## 2014-10-01 DIAGNOSIS — Z95828 Presence of other vascular implants and grafts: Secondary | ICD-10-CM

## 2014-10-01 DIAGNOSIS — G43109 Migraine with aura, not intractable, without status migrainosus: Secondary | ICD-10-CM

## 2014-10-01 DIAGNOSIS — M532X8 Spinal instabilities, sacral and sacrococcygeal region: Secondary | ICD-10-CM

## 2014-10-01 DIAGNOSIS — M898X9 Other specified disorders of bone, unspecified site: Secondary | ICD-10-CM

## 2014-10-01 DIAGNOSIS — M899 Disorder of bone, unspecified: Secondary | ICD-10-CM

## 2014-10-01 DIAGNOSIS — I82491 Acute embolism and thrombosis of other specified deep vein of right lower extremity: Secondary | ICD-10-CM

## 2014-10-01 LAB — CBC WITH DIFFERENTIAL/PLATELET
BASO%: 0.4 % (ref 0.0–2.0)
Basophils Absolute: 0 10*3/uL (ref 0.0–0.1)
EOS%: 0.5 % (ref 0.0–7.0)
Eosinophils Absolute: 0 10*3/uL (ref 0.0–0.5)
HCT: 38.9 % (ref 34.8–46.6)
HGB: 13 g/dL (ref 11.6–15.9)
LYMPH#: 1.5 10*3/uL (ref 0.9–3.3)
LYMPH%: 33.7 % (ref 14.0–49.7)
MCH: 29.9 pg (ref 25.1–34.0)
MCHC: 33.3 g/dL (ref 31.5–36.0)
MCV: 89.6 fL (ref 79.5–101.0)
MONO#: 1.4 10*3/uL — ABNORMAL HIGH (ref 0.1–0.9)
MONO%: 31.7 % — ABNORMAL HIGH (ref 0.0–14.0)
NEUT#: 1.5 10*3/uL (ref 1.5–6.5)
NEUT%: 33.7 % — ABNORMAL LOW (ref 38.4–76.8)
Platelets: 169 10*3/uL (ref 145–400)
RBC: 4.34 10*6/uL (ref 3.70–5.45)
RDW: 14.9 % — AB (ref 11.2–14.5)
WBC: 4.3 10*3/uL (ref 3.9–10.3)

## 2014-10-01 LAB — COMPREHENSIVE METABOLIC PANEL (CC13)
ALBUMIN: 3.4 g/dL — AB (ref 3.5–5.0)
ALT: 16 U/L (ref 0–55)
AST: 15 U/L (ref 5–34)
Alkaline Phosphatase: 93 U/L (ref 40–150)
Anion Gap: 12 mEq/L — ABNORMAL HIGH (ref 3–11)
BUN: 12.6 mg/dL (ref 7.0–26.0)
CALCIUM: 8.5 mg/dL (ref 8.4–10.4)
CHLORIDE: 104 meq/L (ref 98–109)
CO2: 22 mEq/L (ref 22–29)
Creatinine: 0.8 mg/dL (ref 0.6–1.1)
EGFR: 88 mL/min/{1.73_m2} — ABNORMAL LOW (ref >90)
Glucose: 159 mg/dl — ABNORMAL HIGH (ref 70–140)
POTASSIUM: 3.6 meq/L (ref 3.5–5.1)
Sodium: 138 mEq/L (ref 136–145)
Total Bilirubin: 0.64 mg/dL (ref 0.20–1.20)
Total Protein: 6.3 g/dL — ABNORMAL LOW (ref 6.4–8.3)

## 2014-10-01 MED ORDER — SODIUM CHLORIDE 0.9 % IJ SOLN
10.0000 mL | INTRAMUSCULAR | Status: DC | PRN
Start: 2014-10-01 — End: 2014-10-01
  Administered 2014-10-01: 10 mL via INTRAVENOUS
  Filled 2014-10-01: qty 10

## 2014-10-01 MED ORDER — TOPIRAMATE 50 MG PO TABS: 50.0000 mg | ORAL_TABLET | Freq: Two times a day (BID) | ORAL | Status: DC

## 2014-10-01 MED ORDER — HEPARIN SOD (PORK) LOCK FLUSH 100 UNIT/ML IV SOLN
500.0000 [IU] | Freq: Once | INTRAVENOUS | Status: AC
  Administered 2014-10-01: 500 [IU] via INTRAVENOUS
  Filled 2014-10-01: qty 5

## 2014-10-01 NOTE — Progress Notes (Signed)
[facility]  Telephone:(336) [phone] Fax:([phone]) [phone]     ID: SHEREENA BERQUIST DOB: 1961-04-28  MR#: [id number]  XTA#:[id number]  Patient Care Team: Stephens Shire, MD as PCP - General (Family Medicine) PCP: Stephens Shire, MD GYN: [name] MD SU: [name] MD OTHER MD: [name] MD, Phylliss Bob MD, Jamie Kato MD  CHIEF COMPLAINT: Estrogen receptor positive breast cancer  CURRENT TREATMENT: Adjuvant chemotherapy   BREAST CANCER HISTORY: "[name]" had routine screening mammography January 2016 in Dr. Tonette Bihari office showing a possible change in the right breast. On 07/24/2014 at the breast Center she underwent right digital mammography and ultrasonography. The breast density was category A. In the lower inner quadrant of the right breast there was a 2 cm mass which was palpable by exam. Ultrasound confirmed a 1.5 cm irregular hypoechoic mass at the 3:30 o'clock position 12 cm from the nipple. There was no right axillary adenopathy.  Biopsy of the mass in question 07/24/2014 showed ([id number]) invasive ductal carcinoma, grade 2 or 3, estrogen receptor 98% positive, progesterone receptor 76% positive, both with strong staining intensity, with an MIB-1 of 66% and no HER-2 amplification by FISH.  The patient's case was discussed at the multidisciplinary breast cancer conference 08/08/2014 and it was felt the patient would benefit from breast conserving surgery and likely would need an Oncotype to decide on optimal systemic therapy. She would need radiation and hormones. The question of genetics counseling was also raised.  On 08/27/2014 the patient underwent right lumpectomy and sentinel lymph node sampling. The final pathology ([id number]) confirmed invasive ductal carcinoma, grade 3, measuring 1.9 cm. There was evidence of lymphovascular and perineural invasion. Margins were negative but close, the closest margin for the in situ component being 1 mm.  The single sentinel lymph node was negative. Repeat HER-2 was again negative, with a signals ratio of 0.97 and a number per cell of 1.60.  The patient's subsequent history is as detailed below  INTERVAL HISTORY: [name] returns today for follow up of her breast cancer, accompanied by her sister.  Today is day 8, cycle 1 of cyclophosphamide and docetaxel given every 3 weeks, with neulasta on day 2 for granulocyte support.   REVIEW OF SYSTEMS: [name] is not feeling well today. She said the first 2 days after chemo she was fine, but then she began to have nausea and episodes of vomiting. She called the triage line and she started on zofran, which may have caused headaches, which continue today. She has a history of migraine associated nausea as well. She did not take the relpax she was prescribed because that too can make her nauseous. She has mild chest rash near the area of her port, but it does not look to be infected. She has a sore, scratchy throat, but no other sinus symptoms. Her tmax was 99.9. She has continuous intense muscle and bone pain. She has been using her oxycodone 22m as advised by triage, but it is not strong enough. She may use her percocet (10/325) but hasn't yet because that may be too strong. She did have some constipation, but this seems to have resolved. Her appetite is down and she is having trouble keeping up with her fluids up until yesterday. She does use protein supplements daily, but is already down 8lb. She is losing her sense of taste which compounds the issue. A detailed review of systems is otherwise stable.  PAST MEDICAL HISTORY: Past Medical History  Diagnosis Date  .  Hypoxia     after surgery  . DVT (deep venous thrombosis) 03/2014    R leg, post op- on Xarelto   . Hypertension     pt. reports that she has been higher in the past & again now but doesn't take any med.,never has for ^BP, pt. stating that BP is high now b/c of her pain in her back.    . H/O  cardiovascular stress test 2012     test done for stress from her Father dying, had chest pain ,pt. had a stress/echo  test, told it was wnl  . Anxiety     relative for to pain & frustration of prev. hospitalization   . PONV (postoperative nausea and vomiting)     low O2 sats, < 50% post op  . Family history of adverse reaction to anesthesia     PONV  . Gestational diabetes 1986; 1996  . Migraines     "at least q other week" (08/27/2014)  . Arthritis     degenerative lumbar spine    . Chronic back pain   . Breast cancer of lower-inner quadrant of right female breast   . Colon cancer   . Breast cancer 2016  . Family history of breast cancer   . Family history of colon cancer     PAST SURGICAL HISTORY: Past Surgical History  Procedure Laterality Date  . Endometrial ablation  ~ 1998  . Colonoscopy w/ polypectomy  2015    found tubulovillous adenoma with focal high grade displasia  . Plantar fascia release Right ~ 2000  . Eye muscle surgery Bilateral ~ 1965    to fix cross-eye as child  . Sacroiliac joint fusion Right 02/22/2014    Procedure: SACROILIAC JOINT FUSION;  Surgeon: Sinclair Ship, MD;  Location: [facility];  Service: Orthopedics;  Laterality: Right;  Right sided sacroiliac joint fusion  . Radiofrequency ablation nerves      x 2 to nerves in her lower back  . Dental surgery  ~ 2011    "replaced bone graft upper jaw; put 2 implants in"  . Tubal ligation  1996  . Axillary sentinel node biopsy Right 08/27/2014    Archie Endo 08/27/2014  . Tonsillectomy  1988  . Excisional hemorrhoidectomy  2015  . Back surgery    . Breast biopsy Right 07/24/2014    core biopsy  . Breast lumpectomy Right 08/27/2014    Archie Endo 08/27/2014  . Radioactive seed guided mastectomy with axillary sentinel lymph node biopsy Right 08/27/2014    Procedure: RADIOACTIVE SEED GUIDED RIGHT BREAST LUMPECTOMY WITH RIGHT AXILLARY SENTINEL LYMPH NODE BIOPSY;  Surgeon: [name], MD;  Location: [facility];   Service: General;  Laterality: Right;  . Portacath placement N/A 09/19/2014    Procedure: INSERTION PORT-A-CATH;  Surgeon: [name], MD;  Location[facility];  Service: General;  Laterality: N/A;    FAMILY HISTORY Family History  Problem Relation Age of Onset  . Breast cancer Cousin 46    maternal first cousin  . Colon cancer Maternal Aunt 75  . Throat cancer Maternal Aunt 75    smoker  . Breast cancer Paternal Aunt 67  . Heart disease Mother   . Heart disease Father   . Diabetes type II Father   . Stomach cancer Maternal Uncle     dx in his 26s  . Colon cancer Cousin 6     maternal first cousin  . Prostate cancer Cousin 59    paternal first cousin  .  Breast cancer Paternal Aunt     dx in her 65s  . Breast cancer Other     mother's paternal first cousin  . Colon cancer Other 90    MGF's sister  . Brain cancer Other 22    MGFs sister  . Breast cancer Other     MGF's paternal aunt  . Prostate cancer Other     MGF's paternal first cousin   the patient's father died at the age of 45 from heart failure in the setting of diabetes. The patient's mother is currently 66 years old. The patient has 3 brothers, 2 sisters. There is no history of breast, ovarian, or colon cancer in first-degree relatives. However, on the mother's side the patient has 3 relatives with breast cancer (a cousin diagnosed age 23, a second cousin diagnosed age 49, and a great aunt). There is also a history of colon cancer diagnosed age around age 22 in a great aunt and small intestinal cancer in an aunt diagnosed age 68. On the father's side there are 2 cousins with breast cancer diagnosed age 77 and 31, and a cousin with colon cancer diagnosed age 32  GYNECOLOGIC HISTORY:  No LMP recorded. Patient has had an ablation. Menarche age 20, the patient carried 2 children to term, the first at age 32. After the first live birth she had a premature girl who died after one day (she had been a breech birth) and she  also had 3 miscarriages. The patient underwent endometrial ablation in 1998 and has had no periods since that time.  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 Her husband Braulio Conte works for [employer] in the [place] and Manufacturing systems engineer. Son Harvie Bridge lives in [place] and works in Education officer, community. He was an Gaffer) and son Lynelle Smoke is 23 and works for [employer]. He also lives in [place]. The patient has one granddaughter. The patient is a Psychologist, forensic    ADVANCED DIRECTIVES: Not in place   HEALTH MAINTENANCE: History  Substance Use Topics  . Smoking status: Never Smoker   . Smokeless tobacco: Never Used  . Alcohol Use: No     Colonoscopy: June 2015/May and last repeat planned 10/03/2014  PAP: 2015  Bone density:  Lipid panel:  Allergies  Allergen Reactions  . Morphine And Related Nausea And Vomiting  . Sulfa Antibiotics Hives    Current Outpatient Prescriptions  Medication Sig Dispense Refill  . dexamethasone (DECADRON) 4 MG tablet Take 2 tablets (8 mg total) by mouth 2 (two) times daily. Start the day before Taxotere. Then again the day after chemo for 3 days. 30 tablet 1  . lidocaine-prilocaine (EMLA) cream Apply to affected area once 30 g 3  . loratadine (CLARITIN) 10 MG tablet Take 10 mg by mouth daily.    Marland Kitchen LORazepam (ATIVAN) 0.5 MG tablet Take 1 tablet (0.5 mg total) by mouth every 6 (six) hours as needed (Nausea or vomiting). 30 tablet 0  . ondansetron (ZOFRAN) 8 MG tablet Take 1 tablet (8 mg total) by mouth 2 (two) times daily. Start the day after chemo for 3 days. Then take as needed for nausea or vomiting. 30 tablet 1  . oxyCODONE (OXY IR/ROXICODONE) 5 MG immediate release tablet     . rivaroxaban (XARELTO) 20 MG TABS tablet Take 20 mg by mouth every morning.    Marland Kitchen acetaminophen-codeine (TYLENOL #3) 300-30 MG per tablet Take 1 tablet  by mouth every 8  (eight) hours as needed for moderate pain.   0  . eletriptan (RELPAX) 40 MG tablet Take 40 mg by mouth as needed for migraine or headache. One tablet by mouth at onset of headache. May repeat in 2 hours if headache persists or recurs.    Marland Kitchen oxyCODONE-acetaminophen (PERCOCET) 10-325 MG per tablet Take 1 tablet by mouth every 6 (six) hours as needed for pain. (Patient not taking: Reported on 09/19/2014) 20 tablet 0  . prochlorperazine (COMPAZINE) 10 MG tablet Take 1 tablet (10 mg total) by mouth every 6 (six) hours as needed (Nausea or vomiting). (Patient not taking: Reported on 10/01/2014) 30 tablet 1  . topiramate (TOPAMAX) 50 MG tablet Take 1 tablet (50 mg total) by mouth 2 (two) times daily. For first week start with 1/2 tab twice daily. 60 tablet 2   No current facility-administered medications for this visit.   Facility-Administered Medications Ordered in Other Visits  Medication Dose Route Frequency Provider Last Rate Last Dose  . sodium chloride 0.9 % injection 10 mL  10 mL Intravenous PRN Chauncey Cruel, MD   10 mL at 10/01/14 0941    OBJECTIVE: Middle-aged white woman who walks with difficulty Filed Vitals:   10/01/14 1003  BP: 119/79  Pulse: 101  Temp: 98 F (36.7 C)  Resp: 20     Body mass index is 42.61 kg/(m^2).    ECOG FS:2 - Symptomatic, <50% confined to bed  Skin: mild erythematous area about 2 inches all around port site.  Sclerae unicteric, pupils equal and reactive Oropharynx clear and moist-- no thrush No cervical or supraclavicular adenopathy Lungs no rales or rhonchi Heart regular rate and rhythm Abd soft, nontender, positive bowel sounds MSK no focal spinal tenderness, no upper extremity lymphedema Neuro: nonfocal, well oriented, appropriate affect Breasts: deferred  LAB RESULTS:  CMP     Component Value Date/Time   NA 138 10/01/2014 0937   NA 139 09/26/2014 1406   K 3.6 10/01/2014 0937   K 4.0 09/26/2014 1406   CL 105 09/26/2014 1406   CO2 22  10/01/2014 0937   CO2 24 09/26/2014 1406   GLUCOSE 159* 10/01/2014 0937   GLUCOSE 220* 09/26/2014 1406   BUN 12.6 10/01/2014 0937   BUN 21 09/26/2014 1406   CREATININE 0.8 10/01/2014 0937   CREATININE 0.72 09/26/2014 1406   CALCIUM 8.5 10/01/2014 0937   CALCIUM 8.6 09/26/2014 1406   PROT 6.3* 10/01/2014 0937   PROT 6.4 09/26/2014 1406   ALBUMIN 3.4* 10/01/2014 0937   ALBUMIN 3.9 09/26/2014 1406   AST 15 10/01/2014 0937   AST 15 09/26/2014 1406   ALT 16 10/01/2014 0937   ALT 13 09/26/2014 1406   ALKPHOS 93 10/01/2014 0937   ALKPHOS 83 09/26/2014 1406   BILITOT 0.64 10/01/2014 0937   BILITOT 0.7 09/26/2014 1406   GFRNONAA >90 09/19/2014 0707   GFRAA >90 09/19/2014 0707    INo results found for: SPEP, UPEP  Lab Results  Component Value Date   WBC 4.3 10/01/2014   NEUTROABS 1.5 10/01/2014   HGB 13.0 10/01/2014   HCT 38.9 10/01/2014   MCV 89.6 10/01/2014   PLT 169 10/01/2014      Chemistry      Component Value Date/Time   NA 138 10/01/2014 0937   NA 139 09/26/2014 1406   K 3.6 10/01/2014 0937   K 4.0 09/26/2014 1406   CL 105 09/26/2014 1406   CO2 22 10/01/2014 8768  CO2 24 09/26/2014 1406   BUN 12.6 10/01/2014 0937   BUN 21 09/26/2014 1406   CREATININE 0.8 10/01/2014 0937   CREATININE 0.72 09/26/2014 1406      Component Value Date/Time   CALCIUM 8.5 10/01/2014 0937   CALCIUM 8.6 09/26/2014 1406   ALKPHOS 93 10/01/2014 0937   ALKPHOS 83 09/26/2014 1406   AST 15 10/01/2014 0937   AST 15 09/26/2014 1406   ALT 16 10/01/2014 0937   ALT 13 09/26/2014 1406   BILITOT 0.64 10/01/2014 0937   BILITOT 0.7 09/26/2014 1406       No results found for: LABCA2  No components found for: LABCA125  No results for input(s): INR in the last 168 hours.  Urinalysis    Component Value Date/Time   COLORURINE AMBER* 02/21/2014 1540   APPEARANCEUR CLOUDY* 02/21/2014 1540   LABSPEC 1.029 02/21/2014 1540   PHURINE 5.0 02/21/2014 1540   GLUCOSEU NEGATIVE 02/21/2014 1540     HGBUR SMALL* 02/21/2014 1540   BILIRUBINUR SMALL* 02/21/2014 1540   KETONESUR 15* 02/21/2014 1540   PROTEINUR NEGATIVE 02/21/2014 1540   UROBILINOGEN 1.0 02/21/2014 1540   NITRITE POSITIVE* 02/21/2014 1540   LEUKOCYTESUR TRACE* 02/21/2014 1540    STUDIES: Dg Chest Port 1 View  09/19/2014   CLINICAL DATA:  Port-A-Cath placement  EXAM: PORTABLE CHEST - 1 VIEW  COMPARISON:  02/21/2014  FINDINGS: There is a right-sided Port-A-Cath with the tip projecting over the SVC. There are low lung volumes with crowding of the interstitial markings. There is no focal parenchymal opacity, pleural effusion, or pneumothorax. The heart and mediastinal contours are unremarkable.  The osseous structures are unremarkable.  IMPRESSION: Right-sided Port-A-Cath with the tip projecting over the SVC. No pneumothorax.   Electronically Signed   By: Kathreen Devoid   On: 09/19/2014 12:14   Dg Fluoro Guide Cv Line-no Report  09/19/2014   CLINICAL DATA:    FLOURO GUIDE CV LINE  Fluoroscopy was utilized by the requesting physician.  No radiographic  interpretation.     ASSESSMENT: 54 y.o. [place] woman status post right lumpectomy and sentinel lymph node sampling 08/27/2014 for a pT1c pN0, stage IA invasive ductal carcinoma, grade 3, estrogen and progesterone receptor positive, HER-2 negative, with an MIB-1 of 66%  (1) adjuvant chemotherapy will consist of cyclophosphamide and docetaxel every 21 days 4, with onpro support  (2) adjuvant radiation will follow chemotherapy  (3) antiestrogen therapy for 10 years will follow radiation  (4) genetics consultation has been requested  (5) a hypercoagulable panel will be sent with next lab draw  (6) a nutrition consult has been requested   PLAN: [name] had a rough first cycle of chemotherapy. The following amendments have been made to her regimen:  Muscle/bone pain: will use oxycodone 68m or percocet (10/325) PRN  Sore throat: gargle with warm salt water Chest rash:  localized to edges of port site, likely dressing irritation  Headaches: either migraine or zofran related. Will stop zofran. Started her on topamax BID. She will use this instead of her relpax which increased her nausea. Nausea: start back using compazine every 6 hours. Also possibly migraine related. .  Decreased appetite/weigh loss: push fluids and protein supplements.   The labs were reviewed in detail and were stable. She will will return in 2 weeks for labs, a follow up visit, and cycle 2 of cyclophosphamide and docetaxel. However, I asked her to check in and leave a message with how she was doing by the end of this  week. [name] understands and agrees with this plan. She knows the goal of treatment in her case is cure. She has been encouraged to call with any issues that might arise before her next visit here.   Laurie Panda, NP   10/01/2014 10:56 AM

## 2014-10-01 NOTE — Patient Instructions (Signed)

## 2014-10-11 ENCOUNTER — Telehealth: Payer: Self-pay | Admitting: Genetic Counselor

## 2014-10-11 ENCOUNTER — Encounter: Payer: Self-pay | Admitting: Genetic Counselor

## 2014-10-11 DIAGNOSIS — Z1379 Encounter for other screening for genetic and chromosomal anomalies: Secondary | ICD-10-CM | POA: Insufficient documentation

## 2014-10-11 NOTE — Telephone Encounter (Signed)
Revealed negative genetic testing on Comprehensive cancer panel.

## 2014-10-15 ENCOUNTER — Other Ambulatory Visit: Payer: Self-pay

## 2014-10-15 ENCOUNTER — Encounter: Payer: Self-pay | Admitting: Genetic Counselor

## 2014-10-15 ENCOUNTER — Other Ambulatory Visit (HOSPITAL_BASED_OUTPATIENT_CLINIC_OR_DEPARTMENT_OTHER): Payer: BC Managed Care – PPO

## 2014-10-15 ENCOUNTER — Encounter: Payer: Self-pay | Admitting: Nurse Practitioner

## 2014-10-15 ENCOUNTER — Ambulatory Visit (HOSPITAL_BASED_OUTPATIENT_CLINIC_OR_DEPARTMENT_OTHER): Payer: BC Managed Care – PPO | Admitting: Nurse Practitioner

## 2014-10-15 ENCOUNTER — Ambulatory Visit (HOSPITAL_BASED_OUTPATIENT_CLINIC_OR_DEPARTMENT_OTHER): Payer: BC Managed Care – PPO

## 2014-10-15 ENCOUNTER — Ambulatory Visit: Payer: BC Managed Care – PPO

## 2014-10-15 ENCOUNTER — Other Ambulatory Visit: Payer: Self-pay | Admitting: Oncology

## 2014-10-15 VITALS — BP 135/79 | HR 123 | Temp 97.4°F | Resp 19 | Ht 67.0 in | Wt 270.6 lb

## 2014-10-15 DIAGNOSIS — M532X8 Spinal instabilities, sacral and sacrococcygeal region: Secondary | ICD-10-CM

## 2014-10-15 DIAGNOSIS — I82491 Acute embolism and thrombosis of other specified deep vein of right lower extremity: Secondary | ICD-10-CM

## 2014-10-15 DIAGNOSIS — G43109 Migraine with aura, not intractable, without status migrainosus: Secondary | ICD-10-CM

## 2014-10-15 DIAGNOSIS — Z1379 Encounter for other screening for genetic and chromosomal anomalies: Secondary | ICD-10-CM

## 2014-10-15 DIAGNOSIS — C50311 Malignant neoplasm of lower-inner quadrant of right female breast: Secondary | ICD-10-CM

## 2014-10-15 DIAGNOSIS — G8929 Other chronic pain: Secondary | ICD-10-CM

## 2014-10-15 DIAGNOSIS — Z5111 Encounter for antineoplastic chemotherapy: Secondary | ICD-10-CM | POA: Diagnosis not present

## 2014-10-15 DIAGNOSIS — Z95828 Presence of other vascular implants and grafts: Secondary | ICD-10-CM

## 2014-10-15 DIAGNOSIS — R110 Nausea: Secondary | ICD-10-CM

## 2014-10-15 DIAGNOSIS — M545 Low back pain: Secondary | ICD-10-CM

## 2014-10-15 DIAGNOSIS — T451X5A Adverse effect of antineoplastic and immunosuppressive drugs, initial encounter: Secondary | ICD-10-CM

## 2014-10-15 DIAGNOSIS — Z800 Family history of malignant neoplasm of digestive organs: Secondary | ICD-10-CM

## 2014-10-15 DIAGNOSIS — Z6841 Body Mass Index (BMI) 40.0 and over, adult: Secondary | ICD-10-CM

## 2014-10-15 DIAGNOSIS — R630 Anorexia: Secondary | ICD-10-CM

## 2014-10-15 DIAGNOSIS — Z803 Family history of malignant neoplasm of breast: Secondary | ICD-10-CM

## 2014-10-15 DIAGNOSIS — R51 Headache: Secondary | ICD-10-CM | POA: Diagnosis not present

## 2014-10-15 LAB — CBC WITH DIFFERENTIAL/PLATELET
BASO%: 0.6 % (ref 0.0–2.0)
Basophils Absolute: 0.1 10*3/uL (ref 0.0–0.1)
EOS%: 0.1 % (ref 0.0–7.0)
Eosinophils Absolute: 0 10*3/uL (ref 0.0–0.5)
HCT: 38.9 % (ref 34.8–46.6)
HEMOGLOBIN: 12.8 g/dL (ref 11.6–15.9)
LYMPH%: 6.6 % — AB (ref 14.0–49.7)
MCH: 30.1 pg (ref 25.1–34.0)
MCHC: 32.9 g/dL (ref 31.5–36.0)
MCV: 91.6 fL (ref 79.5–101.0)
MONO#: 0.2 10*3/uL (ref 0.1–0.9)
MONO%: 2.2 % (ref 0.0–14.0)
NEUT%: 90.5 % — ABNORMAL HIGH (ref 38.4–76.8)
NEUTROS ABS: 7.9 10*3/uL — AB (ref 1.5–6.5)
PLATELETS: 342 10*3/uL (ref 145–400)
RBC: 4.25 10*6/uL (ref 3.70–5.45)
RDW: 15.2 % — AB (ref 11.2–14.5)
WBC: 8.7 10*3/uL (ref 3.9–10.3)
lymph#: 0.6 10*3/uL — ABNORMAL LOW (ref 0.9–3.3)

## 2014-10-15 LAB — COMPREHENSIVE METABOLIC PANEL (CC13)
ALT: 24 U/L (ref 0–55)
AST: 13 U/L (ref 5–34)
Albumin: 3.9 g/dL (ref 3.5–5.0)
Alkaline Phosphatase: 89 U/L (ref 40–150)
Anion Gap: 13 mEq/L — ABNORMAL HIGH (ref 3–11)
BUN: 11.9 mg/dL (ref 7.0–26.0)
CO2: 17 mEq/L — ABNORMAL LOW (ref 22–29)
Calcium: 9.3 mg/dL (ref 8.4–10.4)
Chloride: 110 mEq/L — ABNORMAL HIGH (ref 98–109)
Creatinine: 0.9 mg/dL (ref 0.6–1.1)
EGFR: 77 mL/min/{1.73_m2} — ABNORMAL LOW (ref >90)
GLUCOSE: 261 mg/dL — AB (ref 70–140)
POTASSIUM: 3.9 meq/L (ref 3.5–5.1)
Sodium: 140 mEq/L (ref 136–145)
Total Bilirubin: 0.44 mg/dL (ref 0.20–1.20)
Total Protein: 6.9 g/dL (ref 6.4–8.3)

## 2014-10-15 MED ORDER — SODIUM CHLORIDE 0.9 % IV SOLN
600.0000 mg/m2 | Freq: Once | INTRAVENOUS | Status: AC
  Administered 2014-10-15: 1480 mg via INTRAVENOUS
  Filled 2014-10-15: qty 74

## 2014-10-15 MED ORDER — PEGFILGRASTIM 6 MG/0.6ML ~~LOC~~ PSKT
6.0000 mg | PREFILLED_SYRINGE | Freq: Once | SUBCUTANEOUS | Status: AC
  Administered 2014-10-15: 6 mg via SUBCUTANEOUS
  Filled 2014-10-15: qty 0.6

## 2014-10-15 MED ORDER — HEPARIN SOD (PORK) LOCK FLUSH 100 UNIT/ML IV SOLN
500.0000 [IU] | Freq: Once | INTRAVENOUS | Status: AC | PRN
  Administered 2014-10-15: 500 [IU]
  Filled 2014-10-15: qty 5

## 2014-10-15 MED ORDER — SODIUM CHLORIDE 0.9 % IJ SOLN
10.0000 mL | INTRAMUSCULAR | Status: DC | PRN
  Administered 2014-10-15: 10 mL
  Filled 2014-10-15: qty 10

## 2014-10-15 MED ORDER — SODIUM CHLORIDE 0.9 % IV SOLN
Freq: Once | INTRAVENOUS | Status: AC
  Administered 2014-10-15: 10:00:00 via INTRAVENOUS
  Filled 2014-10-15: qty 8

## 2014-10-15 MED ORDER — SODIUM CHLORIDE 0.9 % IJ SOLN
10.0000 mL | INTRAMUSCULAR | Status: DC | PRN
  Administered 2014-10-15: 10 mL via INTRAVENOUS
  Filled 2014-10-15: qty 10

## 2014-10-15 MED ORDER — SODIUM CHLORIDE 0.9 % IV SOLN
Freq: Once | INTRAVENOUS | Status: AC
  Administered 2014-10-15: 10:00:00 via INTRAVENOUS

## 2014-10-15 MED ORDER — DOCETAXEL CHEMO INJECTION 160 MG/16ML
75.0000 mg/m2 | Freq: Once | INTRAVENOUS | Status: AC
  Administered 2014-10-15: 180 mg via INTRAVENOUS
  Filled 2014-10-15: qty 18

## 2014-10-15 NOTE — Patient Instructions (Signed)

## 2014-10-15 NOTE — Patient Instructions (Signed)
[facility] Discharge Instructions for Patients Receiving Chemotherapy  Today you received the following chemotherapy agents: Taxotere, Cytoxan  To help prevent nausea and vomiting after your treatment, we encourage you to take your nausea medication as prescribed by your physician. You may take your compazine tonight before bedtime, and tomorrow AM.   If you develop nausea and vomiting that is not controlled by your nausea medication, call the clinic.   BELOW ARE SYMPTOMS THAT SHOULD BE REPORTED IMMEDIATELY:  *FEVER GREATER THAN 100.5 F  *CHILLS WITH OR WITHOUT FEVER  NAUSEA AND VOMITING THAT IS NOT CONTROLLED WITH YOUR NAUSEA MEDICATION  *UNUSUAL SHORTNESS OF BREATH  *UNUSUAL BRUISING OR BLEEDING  TENDERNESS IN MOUTH AND THROAT WITH OR WITHOUT PRESENCE OF ULCERS  *URINARY PROBLEMS  *BOWEL PROBLEMS  UNUSUAL RASH Items with * indicate a potential emergency and should be followed up as soon as possible.  Feel free to call the clinic you have any questions or concerns. The clinic phone number is [phone].  Please show the [facility] at check-in to the Emergency Department and triage nurse.

## 2014-10-15 NOTE — Progress Notes (Signed)
[facility]  [phone] [phone]) [phone]     ID: CLEOTILDE SPADACCINI DOB: Jan 19, 1971  MR#: [id number]  AFB#:[id number]  Patient Care Team: Stephens Shire, MD as PCP - General (Family Medicine) PCP: Stephens Shire, MD GYN: [name] MD SU: [name] MD OTHER MD: [name] MD, Phylliss Bob MD, Jamie Kato MD  CHIEF COMPLAINT: Estrogen receptor positive breast cancer  CURRENT TREATMENT: Adjuvant chemotherapy   BREAST CANCER HISTORY: "Teresa Aguilar" had routine screening mammography January 2016 in Dr. Tonette Bihari office showing a possible change in the right breast. On 07/24/2014 at the breast Center she underwent right digital mammography and ultrasonography. The breast density was category A. In the lower inner quadrant of the right breast there was a 2 cm mass which was palpable by exam. Ultrasound confirmed a 1.5 cm irregular hypoechoic mass at the 3:30 o'clock position 12 cm from the nipple. There was no right axillary adenopathy.  Biopsy of the mass in question 07/24/2014 showed ([id number]) invasive ductal carcinoma, grade 2 or 3, estrogen receptor 98% positive, progesterone receptor 76% positive, both with strong staining intensity, with an MIB-1 of 66% and no HER-2 amplification by FISH.  The patient's case was discussed at the multidisciplinary breast cancer conference 08/08/2014 and it was felt the patient would benefit from breast conserving surgery and likely would need an Oncotype to decide on optimal systemic therapy. She would need radiation and hormones. The question of genetics counseling was also raised.  On 08/27/2014 the patient underwent right lumpectomy and sentinel lymph node sampling. The final pathology ([id number]) confirmed invasive ductal carcinoma, grade 3, measuring 1.9 cm. There was evidence of lymphovascular and perineural invasion. Margins were negative but close, the closest margin for the in situ component being 1 mm.  The single sentinel lymph node was negative. Repeat HER-2 was again negative, with a signals ratio of 0.97 and a number per cell of 1.60.  The patient's subsequent history is as detailed below  INTERVAL HISTORY: Teresa Aguilar returns today for follow up of her breast cancer, accompanied by her sister.  Today is day 1, cycle 2 of cyclophosphamide and docetaxel given every 3 weeks, with neulasta on day 2 for granulocyte support.   REVIEW OF SYSTEMS: Posie's headaches have improved on the topamax. Her mouth soreness has resolved with the use of warm salt water. She denies fevers, chills ,nausea, or vomiting. She is having more frequent stools but they are at least soft and formed. Her appetite is decreased but she is supplementing with protein appropriately. She continues to have back pain, most of which is chronic. She is using her narcotic appropriately and meets with her orthopedic specialist this week. She has a TENS unit that she will start using, which has been helpful in the past. A detailed review of systems is otherwise stable.    PAST MEDICAL HISTORY: Past Medical History  Diagnosis Date  . Hypoxia     after surgery  . DVT (deep venous thrombosis) 03/2014    R leg, post op- on Xarelto   . Hypertension     pt. reports that she has been higher in the past & again now but doesn't take any med.,never has for ^BP, pt. stating that BP is high now b/c of her pain in her back.    . H/O cardiovascular stress test 2012     test done for stress from her Father dying, had chest pain ,pt. had a stress/echo  test, told it was  wnl  . Anxiety     relative for to pain & frustration of prev. hospitalization   . PONV (postoperative nausea and vomiting)     low O2 sats, < 50% post op  . Family history of adverse reaction to anesthesia     PONV  . Gestational diabetes 1986; 1996  . Migraines     "at least q other week" (08/27/2014)  . Arthritis     degenerative lumbar spine    . Chronic back pain   .  Breast cancer of lower-inner quadrant of right female breast   . Colon cancer   . Breast cancer 2016  . Family history of breast cancer   . Family history of colon cancer     PAST SURGICAL HISTORY: Past Surgical History  Procedure Laterality Date  . Endometrial ablation  ~ 1998  . Colonoscopy w/ polypectomy  2015    found tubulovillous adenoma with focal high grade displasia  . Plantar fascia release Right ~ 2000  . Eye muscle surgery Bilateral ~ 1965    to fix cross-eye as child  . Sacroiliac joint fusion Right 02/22/2014    Procedure: SACROILIAC JOINT FUSION;  Surgeon: Sinclair Ship, MD;  Location: [facility];  Service: Orthopedics;  Laterality: Right;  Right sided sacroiliac joint fusion  . Radiofrequency ablation nerves      x 2 to nerves in her lower back  . Dental surgery  ~ 2011    "replaced bone graft upper jaw; put 2 implants in"  . Tubal ligation  1996  . Axillary sentinel node biopsy Right 08/27/2014    Archie Endo 08/27/2014  . Tonsillectomy  1988  . Excisional hemorrhoidectomy  2015  . Back surgery    . Breast biopsy Right 07/24/2014    core biopsy  . Breast lumpectomy Right 08/27/2014    Archie Endo 08/27/2014  . Radioactive seed guided mastectomy with axillary sentinel lymph node biopsy Right 08/27/2014    Procedure: RADIOACTIVE SEED GUIDED RIGHT BREAST LUMPECTOMY WITH RIGHT AXILLARY SENTINEL LYMPH NODE BIOPSY;  Surgeon: [name], MD;  Location: [facility];  Service: General;  Laterality: Right;  . Portacath placement N/A 09/19/2014    Procedure: INSERTION PORT-A-CATH;  Surgeon: [name], MD;  Location: [facility] OR;  Service: General;  Laterality: N/A;    FAMILY HISTORY Family History  Problem Relation Age of Onset  . Breast cancer Cousin 38    maternal first cousin  . Colon cancer Maternal Aunt 75  . Throat cancer Maternal Aunt 75    smoker  . Breast cancer Paternal Aunt 13  . Heart disease Mother   . Heart disease Father   . Diabetes type II Father   . Stomach  cancer Maternal Uncle     dx in his 28s  . Colon cancer Cousin 75     maternal first cousin  . Prostate cancer Cousin 88    paternal first cousin  . Breast cancer Paternal Aunt     dx in her 33s  . Breast cancer Other     mother's paternal first cousin  . Colon cancer Other 71    MGF's sister  . Brain cancer Other 22    MGFs sister  . Breast cancer Other     MGF's paternal aunt  . Prostate cancer Other     MGF's paternal first cousin   the patient's father died at the age of 21 from heart failure in the setting of diabetes. The patient's mother is currently 11  years old. The patient has 3 brothers, 2 sisters. There is no history of breast, ovarian, or colon cancer in first-degree relatives. However, on the mother's side the patient has 3 relatives with breast cancer (a cousin diagnosed age 50, a second cousin diagnosed age 71, and a great aunt). There is also a history of colon cancer diagnosed age around age 82 in a great aunt and small intestinal cancer in an aunt diagnosed age 48. On the father's side there are 2 cousins with breast cancer diagnosed age 35 and 6, and a cousin with colon cancer diagnosed age 81  GYNECOLOGIC HISTORY:  No LMP recorded. Patient has had an ablation. Menarche age 73, the patient carried 2 children to term, the first at age 65. After the first live birth she had a premature girl who died after one day (she had been a breech birth) and she also had 3 miscarriages. The patient underwent endometrial ablation in 1998 and has had no periods since that time.  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 Her husband Braulio Conte works for [employer] in the [place] and Manufacturing systems engineer. Son Harvie Bridge lives in [place] and works in Education officer, community. He was an Gaffer) and son Lynelle Smoke is 63 and works for [employer]. He also lives in [place]. The patient has  one granddaughter. The patient is a Psychologist, forensic    ADVANCED DIRECTIVES: Not in place   HEALTH MAINTENANCE: History  Substance Use Topics  . Smoking status: Never Smoker   . Smokeless tobacco: Never Used  . Alcohol Use: No     Colonoscopy: June 2015/May and last repeat planned 10/03/2014  PAP: 2015  Bone density:  Lipid panel:  Allergies  Allergen Reactions  . Morphine And Related Nausea And Vomiting  . Sulfa Antibiotics Hives    Current Outpatient Prescriptions  Medication Sig Dispense Refill  . dexamethasone (DECADRON) 4 MG tablet Take 2 tablets (8 mg total) by mouth 2 (two) times daily. Start the day before Taxotere. Then again the day after chemo for 3 days. 30 tablet 1  . lidocaine-prilocaine (EMLA) cream Apply to affected area once 30 g 3  . loratadine (CLARITIN) 10 MG tablet Take 10 mg by mouth daily.    Marland Kitchen LORazepam (ATIVAN) 0.5 MG tablet Take 1 tablet (0.5 mg total) by mouth every 6 (six) hours as needed (Nausea or vomiting). 30 tablet 0  . prochlorperazine (COMPAZINE) 10 MG tablet Take 1 tablet (10 mg total) by mouth every 6 (six) hours as needed (Nausea or vomiting). 30 tablet 1  . rivaroxaban (XARELTO) 20 MG TABS tablet Take 20 mg by mouth every morning.    . topiramate (TOPAMAX) 50 MG tablet Take 1 tablet (50 mg total) by mouth 2 (two) times daily. For first week start with 1/2 tab twice daily. 60 tablet 2  . acetaminophen-codeine (TYLENOL #3) 300-30 MG per tablet Take 1 tablet by mouth every 8 (eight) hours as needed for moderate pain.   0  . eletriptan (RELPAX) 40 MG tablet Take 40 mg by mouth as needed for migraine or headache. One tablet by mouth at onset of headache. May repeat in 2 hours if headache persists or recurs.    . fluconazole (DIFLUCAN) 100 MG tablet     . ondansetron (ZOFRAN) 8 MG tablet Take 1 tablet (8 mg total) by mouth 2 (two) times daily. Start the day after chemo for 3 days. Then  take as needed for nausea or vomiting. (Patient not taking: Reported on  10/15/2014) 30 tablet 1  . oxyCODONE (OXY IR/ROXICODONE) 5 MG immediate release tablet     . oxyCODONE-acetaminophen (PERCOCET) 10-325 MG per tablet Take 1 tablet by mouth every 6 (six) hours as needed for pain. (Patient not taking: Reported on 09/19/2014) 20 tablet 0   No current facility-administered medications for this visit.   Facility-Administered Medications Ordered in Other Visits  Medication Dose Route Frequency Provider Last Rate Last Dose  . sodium chloride 0.9 % injection 10 mL  10 mL Intravenous PRN Laurie Panda, NP   10 mL at 10/15/14 0914    OBJECTIVE: Middle-aged white woman who walks with difficulty Filed Vitals:   10/15/14 0928  BP: 135/79  Pulse: 123  Temp: 97.4 F (36.3 C)  Resp: 19     Body mass index is 42.37 kg/(m^2).    ECOG FS:2 - Symptomatic, <50% confined to bed  Skin: warm, dry  HEENT: sclerae anicteric, conjunctivae pink, oropharynx clear. No thrush or mucositis.  Lymph Nodes: No cervical or supraclavicular lymphadenopathy  Lungs: clear to auscultation bilaterally, no rales, wheezes, or rhonci  Heart: regular rate and rhythm  Abdomen: round, soft, non tender, positive bowel sounds  Musculoskeletal: No focal spinal tenderness, no peripheral edema  Neuro: non focal, well oriented, positive affect  Breasts: deferred  LAB RESULTS:  CMP     Component Value Date/Time   NA 140 10/15/2014 0902   NA 139 09/26/2014 1406   K 3.9 10/15/2014 0902   K 4.0 09/26/2014 1406   CL 105 09/26/2014 1406   CO2 17* 10/15/2014 0902   CO2 24 09/26/2014 1406   GLUCOSE 261* 10/15/2014 0902   GLUCOSE 220* 09/26/2014 1406   BUN 11.9 10/15/2014 0902   BUN 21 09/26/2014 1406   CREATININE 0.9 10/15/2014 0902   CREATININE 0.72 09/26/2014 1406   CALCIUM 9.3 10/15/2014 0902   CALCIUM 8.6 09/26/2014 1406   PROT 6.9 10/15/2014 0902   PROT 6.4 09/26/2014 1406   ALBUMIN 3.9 10/15/2014 0902   ALBUMIN 3.9 09/26/2014 1406   AST 13 10/15/2014 0902   AST 15 09/26/2014 1406    ALT 24 10/15/2014 0902   ALT 13 09/26/2014 1406   ALKPHOS 89 10/15/2014 0902   ALKPHOS 83 09/26/2014 1406   BILITOT 0.44 10/15/2014 0902   BILITOT 0.7 09/26/2014 1406   GFRNONAA >90 09/19/2014 0707   GFRAA >90 09/19/2014 0707    INo results found for: SPEP, UPEP  Lab Results  Component Value Date   WBC 8.7 10/15/2014   NEUTROABS 7.9* 10/15/2014   HGB 12.8 10/15/2014   HCT 38.9 10/15/2014   MCV 91.6 10/15/2014   PLT 342 10/15/2014      Chemistry      Component Value Date/Time   NA 140 10/15/2014 0902   NA 139 09/26/2014 1406   K 3.9 10/15/2014 0902   K 4.0 09/26/2014 1406   CL 105 09/26/2014 1406   CO2 17* 10/15/2014 0902   CO2 24 09/26/2014 1406   BUN 11.9 10/15/2014 0902   BUN 21 09/26/2014 1406   CREATININE 0.9 10/15/2014 0902   CREATININE 0.72 09/26/2014 1406      Component Value Date/Time   CALCIUM 9.3 10/15/2014 0902   CALCIUM 8.6 09/26/2014 1406   ALKPHOS 89 10/15/2014 0902   ALKPHOS 83 09/26/2014 1406   AST 13 10/15/2014 0902   AST 15 09/26/2014 1406   ALT 24 10/15/2014 0902  ALT 13 09/26/2014 1406   BILITOT 0.44 10/15/2014 0902   BILITOT 0.7 09/26/2014 1406       No results found for: LABCA2  No components found for: [id number]  No results for input(s): INR in the last 168 hours.  Urinalysis    Component Value Date/Time   COLORURINE AMBER* 02/21/2014 1540   APPEARANCEUR CLOUDY* 02/21/2014 1540   LABSPEC 1.029 02/21/2014 1540   PHURINE 5.0 02/21/2014 1540   GLUCOSEU NEGATIVE 02/21/2014 1540   HGBUR SMALL* 02/21/2014 1540   BILIRUBINUR SMALL* 02/21/2014 1540   KETONESUR 15* 02/21/2014 1540   PROTEINUR NEGATIVE 02/21/2014 1540   UROBILINOGEN 1.0 02/21/2014 1540   NITRITE POSITIVE* 02/21/2014 1540   LEUKOCYTESUR TRACE* 02/21/2014 1540    STUDIES: Dg Chest Port 1 View  09/19/2014   CLINICAL DATA:  Port-A-Cath placement  EXAM: PORTABLE CHEST - 1 VIEW  COMPARISON:  02/21/2014  FINDINGS: There is a right-sided Port-A-Cath with the tip  projecting over the SVC. There are low lung volumes with crowding of the interstitial markings. There is no focal parenchymal opacity, pleural effusion, or pneumothorax. The heart and mediastinal contours are unremarkable.  The osseous structures are unremarkable.  IMPRESSION: Right-sided Port-A-Cath with the tip projecting over the SVC. No pneumothorax.   Electronically Signed   By: Kathreen Devoid   On: 09/19/2014 12:14   Dg Fluoro Guide Cv Line-no Report  09/19/2014   CLINICAL DATA:    FLOURO GUIDE CV LINE  Fluoroscopy was utilized by the requesting physician.  No radiographic  interpretation.     ASSESSMENT: 54 y.o. [place] woman status post right lumpectomy and sentinel lymph node sampling 08/27/2014 for a pT1c pN0, stage IA invasive ductal carcinoma, grade 3, estrogen and progesterone receptor positive, HER-2 negative, with an MIB-1 of 66%  (1) adjuvant chemotherapy will consist of cyclophosphamide and docetaxel every 21 days 4, with onpro support  (2) adjuvant radiation will follow chemotherapy  (3) antiestrogen therapy for 10 years will follow radiation  (4) genetics panel negative for any mutations in the following genes: APC, ATM, AXIN2, BARD1, BMPR1A, BRCA1, BRCA2, BRIP1, CDH1, CDK4, CDKN2A, CHEK2, EPCAM, FANCC, MLH1, MSH2, MSH6, MUTYH, NBN, PALB2, PMS2, POLD1, POLE, PTEN, RAD51C, RAD51D, SCG5/GREM1, SMAD4, STK11, TP53, VHL, and XRCC2.   (5) a hypercoagulable panel will be sent with next lab draw  (6) a nutrition consult has been requested   PLAN: Teresa Aguilar is hesitant to start her second round of chemotherapy, but knows that the adjustments we have made will likely make this a better experience. The labs were reviewed in detail and were stable. She will proceed with her next dose of cyclophosphamide and docetaxel as planned today.   As discussed in the last note she will avoid the use of zofran, and continue the topamax for her headaches.    Teresa Aguilar will return in 1 week for  labs and a nadir visit. She understands and agrees with this plan. She knows the goal of treatment in her case is cure. She has been encouraged to call with any issues that might arise before her next visit here.    Laurie Panda, NP   10/15/2014 10:03 AM

## 2014-10-15 NOTE — Progress Notes (Signed)
HPI: Ms. Tappan was previously seen in the [facility] clinic due to a personal and family history of cancer and concerns regarding a hereditary predisposition to cancer. Please refer to our prior cancer genetics clinic note for more information regarding Ms. Wojnar medical, social and family histories, and our assessment and recommendations, at the time. Ms. Neuberger recent genetic test results were disclosed to her, as were recommendations warranted by these results. These results and recommendations are discussed in more detail below.  GENETIC TEST RESULTS: At the time of Ms. Bisping's visit, we recommended she pursue genetic testing of the Comprehensive Cancer gene panel. The Comprehensive Cancer Panel offered by [facility] includes sequencing and/or deletion duplication testing of the following 32 genes: APC, ATM, AXIN2, BARD1, BMPR1A, BRCA1, BRCA2, BRIP1, CDH1, CDK4, CDKN2A, CHEK2, EPCAM, FANCC, MLH1, MSH2, MSH6, MUTYH, NBN, PALB2, PMS2, POLD1, POLE, PTEN, RAD51C, RAD51D, SCG5/GREM1, SMAD4, STK11, TP53, VHL, and XRCC2.   The report date is October 11, 2014.  Genetic testing was normal, and did not reveal a deleterious mutation in these genes. The test report has been scanned into EPIC and is located under the Media tab.   We discussed with Ms. Eno that since the current genetic testing is not perfect, it is possible there may be a gene mutation in one of these genes that current testing cannot detect, but that chance is small. We also discussed, that it is possible that another gene that has not yet been discovered, or that we have not yet tested, is responsible for the cancer diagnoses in the family, and it is, therefore, important to remain in touch with cancer genetics in the future so that we can continue to offer Ms. Noto the most up to date genetic testing.   ADDITIONAL GENETIC TESTING: We discussed with Ms. Gillen that there are other genes that are associated with  increased cancer risk that can be analyzed. The laboratories that offer such testing look at these additional genes via a hereditary cancer gene panel. Should Ms. Picking wish to pursue additional genetic testing, we are happy to discuss and coordinate this testing, at any time.    CANCER SCREENING RECOMMENDATIONS: This result is reassuring and indicates that Ms. Group likely does not have an increased risk for a future cancer due to a mutation in one of these genes. This normal test also suggests that Ms. Glade cancer was most likely not due to an inherited predisposition associated with one of these genes.  Most cancers happen by chance and this negative test suggests that her cancer falls into this category.  We, therefore, recommended she continue to follow the cancer management and screening guidelines provided by her oncology and primary healthcare provider.   RECOMMENDATIONS FOR FAMILY MEMBERS: Women in this family might be at some increased risk of developing cancer, over the general population risk, simply due to the family history of cancer. We recommended women in this family have a yearly mammogram beginning at age 78, or 79 years younger than the earliest onset of cancer, an an annual clinical breast exam, and perform monthly breast self-exams. Women in this family should also have a gynecological exam as recommended by their primary provider. All family members should have a colonoscopy by age 4.  FOLLOW-UP: Lastly, we discussed with Ms. Zufall that cancer genetics is a rapidly advancing field and it is possible that new genetic tests will be appropriate for her and/or her family members in the future. We encouraged her to remain in  contact with cancer genetics on an annual basis so we can update her personal and family histories and let her know of advances in cancer genetics that may benefit this family.   Our contact number was provided. Ms. Torrez questions were answered  to her satisfaction, and she knows she is welcome to call us at anytime with additional questions or concerns.   Roma Kayser, MS, [facility] Certified Genetic Counselor [email or web address]

## 2014-10-16 ENCOUNTER — Telehealth: Payer: Self-pay | Admitting: *Deleted

## 2014-10-16 NOTE — Telephone Encounter (Signed)
Chemo follow up. Pt states mother was admitted last night, believe that made her upset-vomited. No further N/V or diarrhea. Tolerating food/fluids

## 2014-10-16 NOTE — Telephone Encounter (Signed)
-----   Message from Adalberto Cole, RN sent at 10/15/2014 10:17 AM EDT ----- Regarding: Dr. Vivi Ferns follow up 1st taxotere, has had cytoxan before.

## 2014-10-17 ENCOUNTER — Ambulatory Visit: Payer: Self-pay

## 2014-10-23 ENCOUNTER — Other Ambulatory Visit (HOSPITAL_BASED_OUTPATIENT_CLINIC_OR_DEPARTMENT_OTHER): Payer: BC Managed Care – PPO

## 2014-10-23 ENCOUNTER — Ambulatory Visit (HOSPITAL_BASED_OUTPATIENT_CLINIC_OR_DEPARTMENT_OTHER): Payer: BC Managed Care – PPO | Admitting: Nurse Practitioner

## 2014-10-23 ENCOUNTER — Other Ambulatory Visit: Payer: Self-pay

## 2014-10-23 ENCOUNTER — Encounter: Payer: Self-pay | Admitting: Nurse Practitioner

## 2014-10-23 ENCOUNTER — Ambulatory Visit (HOSPITAL_BASED_OUTPATIENT_CLINIC_OR_DEPARTMENT_OTHER): Payer: BC Managed Care – PPO

## 2014-10-23 VITALS — BP 128/78 | HR 100 | Temp 97.9°F | Resp 18 | Ht 67.0 in | Wt 265.8 lb

## 2014-10-23 DIAGNOSIS — C50311 Malignant neoplasm of lower-inner quadrant of right female breast: Secondary | ICD-10-CM | POA: Diagnosis not present

## 2014-10-23 DIAGNOSIS — Z6841 Body Mass Index (BMI) 40.0 and over, adult: Secondary | ICD-10-CM

## 2014-10-23 DIAGNOSIS — L989 Disorder of the skin and subcutaneous tissue, unspecified: Secondary | ICD-10-CM

## 2014-10-23 DIAGNOSIS — Z95828 Presence of other vascular implants and grafts: Secondary | ICD-10-CM

## 2014-10-23 DIAGNOSIS — R05 Cough: Secondary | ICD-10-CM | POA: Diagnosis not present

## 2014-10-23 DIAGNOSIS — R252 Cramp and spasm: Secondary | ICD-10-CM

## 2014-10-23 DIAGNOSIS — I82491 Acute embolism and thrombosis of other specified deep vein of right lower extremity: Secondary | ICD-10-CM

## 2014-10-23 DIAGNOSIS — R238 Other skin changes: Secondary | ICD-10-CM

## 2014-10-23 DIAGNOSIS — G43109 Migraine with aura, not intractable, without status migrainosus: Secondary | ICD-10-CM

## 2014-10-23 DIAGNOSIS — Z170 Estrogen receptor positive status [ER+]: Secondary | ICD-10-CM

## 2014-10-23 DIAGNOSIS — M532X8 Spinal instabilities, sacral and sacrococcygeal region: Secondary | ICD-10-CM

## 2014-10-23 LAB — CBC WITH DIFFERENTIAL/PLATELET
BASO%: 0.5 % (ref 0.0–2.0)
BASOS ABS: 0.1 10*3/uL (ref 0.0–0.1)
EOS%: 0.2 % (ref 0.0–7.0)
Eosinophils Absolute: 0 10*3/uL (ref 0.0–0.5)
HCT: 36.5 % (ref 34.8–46.6)
HGB: 12.2 g/dL (ref 11.6–15.9)
LYMPH%: 16.9 % (ref 14.0–49.7)
MCH: 30.6 pg (ref 25.1–34.0)
MCHC: 33.4 g/dL (ref 31.5–36.0)
MCV: 91.5 fL (ref 79.5–101.0)
MONO#: 1.9 10*3/uL — AB (ref 0.1–0.9)
MONO%: 15 % — ABNORMAL HIGH (ref 0.0–14.0)
NEUT#: 8.7 10*3/uL — ABNORMAL HIGH (ref 1.5–6.5)
NEUT%: 67.4 % (ref 38.4–76.8)
Platelets: 181 10*3/uL (ref 145–400)
RBC: 3.99 10*6/uL (ref 3.70–5.45)
RDW: 14.9 % — AB (ref 11.2–14.5)
WBC: 12.9 10*3/uL — ABNORMAL HIGH (ref 3.9–10.3)
lymph#: 2.2 10*3/uL (ref 0.9–3.3)

## 2014-10-23 LAB — COMPREHENSIVE METABOLIC PANEL (CC13)
ALK PHOS: 93 U/L (ref 40–150)
ALT: 27 U/L (ref 0–55)
AST: 22 U/L (ref 5–34)
Albumin: 3.7 g/dL (ref 3.5–5.0)
Anion Gap: 13 mEq/L — ABNORMAL HIGH (ref 3–11)
BUN: 6.5 mg/dL — ABNORMAL LOW (ref 7.0–26.0)
CALCIUM: 8.4 mg/dL (ref 8.4–10.4)
CO2: 24 mEq/L (ref 22–29)
Chloride: 105 mEq/L (ref 98–109)
Creatinine: 0.8 mg/dL (ref 0.6–1.1)
EGFR: 84 mL/min/{1.73_m2} — ABNORMAL LOW (ref >90)
Glucose: 115 mg/dl (ref 70–140)
Potassium: 3.3 mEq/L — ABNORMAL LOW (ref 3.5–5.1)
Sodium: 142 mEq/L (ref 136–145)
Total Bilirubin: 0.58 mg/dL (ref 0.20–1.20)
Total Protein: 6.1 g/dL — ABNORMAL LOW (ref 6.4–8.3)

## 2014-10-23 MED ORDER — KETOCONAZOLE 2 % EX SHAM: 1.0000 "application " | MEDICATED_SHAMPOO | CUTANEOUS | Status: DC

## 2014-10-23 MED ORDER — SODIUM CHLORIDE 0.9 % IJ SOLN
10.0000 mL | INTRAMUSCULAR | Status: DC | PRN
  Administered 2014-10-23: 10 mL via INTRAVENOUS
  Filled 2014-10-23: qty 10

## 2014-10-23 MED ORDER — HEPARIN SOD (PORK) LOCK FLUSH 100 UNIT/ML IV SOLN
500.0000 [IU] | Freq: Once | INTRAVENOUS | Status: AC
  Administered 2014-10-23: 500 [IU] via INTRAVENOUS
  Filled 2014-10-23: qty 5

## 2014-10-23 NOTE — Patient Instructions (Signed)

## 2014-10-23 NOTE — Progress Notes (Signed)
[place]  [phone] Fax:([phone]) [phone]     ID: Teresa Aguilar DOB: June 01, 1961  MR#: [id number]  FBP#:[id number]  Patient Care Team: Stephens Shire, MD as PCP - General (Family Medicine) PCP: Stephens Shire, MD GYN: [name] MD SU: [name] MD OTHER MD: [name] MD, Phylliss Bob MD, Jamie Kato MD  CHIEF COMPLAINT: Estrogen receptor positive breast cancer  CURRENT TREATMENT: Adjuvant chemotherapy   BREAST CANCER HISTORY: "Teresa Aguilar" had routine screening mammography January 2016 in Dr. Tonette Bihari office showing a possible change in the right breast. On 07/24/2014 at the breast Center she underwent right digital mammography and ultrasonography. The breast density was category A. In the lower inner quadrant of the right breast there was a 2 cm mass which was palpable by exam. Ultrasound confirmed a 1.5 cm irregular hypoechoic mass at the 3:30 o'clock position 12 cm from the nipple. There was no right axillary adenopathy.  Biopsy of the mass in question 07/24/2014 showed ([id number]) invasive ductal carcinoma, grade 2 or 3, estrogen receptor 98% positive, progesterone receptor 76% positive, both with strong staining intensity, with an MIB-1 of 66% and no HER-2 amplification by FISH.  The patient's case was discussed at the multidisciplinary breast cancer conference 08/08/2014 and it was felt the patient would benefit from breast conserving surgery and likely would need an Oncotype to decide on optimal systemic therapy. She would need radiation and hormones. The question of genetics counseling was also raised.  On 08/27/2014 the patient underwent right lumpectomy and sentinel lymph node sampling. The final pathology ([id number]) confirmed invasive ductal carcinoma, grade 3, measuring 1.9 cm. There was evidence of lymphovascular and perineural invasion. Margins were negative but close, the closest margin for the in situ component being 1 mm.  The single sentinel lymph node was negative. Repeat HER-2 was again negative, with a signals ratio of 0.97 and a number per cell of 1.60.  The patient's subsequent history is as detailed below  INTERVAL HISTORY: Teresa Aguilar returns today for follow up of her breast cancer.  Today is day 8, cycle 2 of cyclophosphamide and docetaxel given every 3 weeks, with neulasta on day 2 for granulocyte support.   REVIEW OF SYSTEMS: Teresa Aguilar managed this cycle of chemo better than the first. She denies fevers or chills. Her mild nausea is best managed with ice water instead of the antiemetics. She continues to have frequent stools, but is wary of using imodium. Her appetite continues to be decreased but she is using the [employer] protein shakes. She has had no headaches this round. She feels increased discomfort to her port, especially along her right throat. It causes her to cough a lot and is uncomfortable at night. A detailed review of systems is otherwise stable.  PAST MEDICAL HISTORY: Past Medical History  Diagnosis Date  . Hypoxia     after surgery  . DVT (deep venous thrombosis) 03/2014    R leg, post op- on Xarelto   . Hypertension     pt. reports that she has been higher in the past & again now but doesn't take any med.,never has for ^BP, pt. stating that BP is high now b/c of her pain in her back.    . H/O cardiovascular stress test 2012     test done for stress from her Father dying, had chest pain ,pt. had a stress/echo  test, told it was wnl  . Anxiety     relative for to pain & frustration  of prev. hospitalization   . PONV (postoperative nausea and vomiting)     low O2 sats, < 50% post op  . Family history of adverse reaction to anesthesia     PONV  . Gestational diabetes 1986; 1996  . Migraines     "at least q other week" (08/27/2014)  . Arthritis     degenerative lumbar spine    . Chronic back pain   . Breast cancer of lower-inner quadrant of right female breast   . Colon cancer   .  Breast cancer 2016  . Family history of breast cancer   . Family history of colon cancer     PAST SURGICAL HISTORY: Past Surgical History  Procedure Laterality Date  . Endometrial ablation  ~ 1998  . Colonoscopy w/ polypectomy  2015    found tubulovillous adenoma with focal high grade displasia  . Plantar fascia release Right ~ 2000  . Eye muscle surgery Bilateral ~ 1965    to fix cross-eye as child  . Sacroiliac joint fusion Right 02/22/2014    Procedure: SACROILIAC JOINT FUSION;  Surgeon: Sinclair Ship, MD;  Location: [facility];  Service: Orthopedics;  Laterality: Right;  Right sided sacroiliac joint fusion  . Radiofrequency ablation nerves      x 2 to nerves in her lower back  . Dental surgery  ~ 2011    "replaced bone graft upper jaw; put 2 implants in"  . Tubal ligation  1996  . Axillary sentinel node biopsy Right 08/27/2014    Archie Endo 08/27/2014  . Tonsillectomy  1988  . Excisional hemorrhoidectomy  2015  . Back surgery    . Breast biopsy Right 07/24/2014    core biopsy  . Breast lumpectomy Right 08/27/2014    Archie Endo 08/27/2014  . Radioactive seed guided mastectomy with axillary sentinel lymph node biopsy Right 08/27/2014    Procedure: RADIOACTIVE SEED GUIDED RIGHT BREAST LUMPECTOMY WITH RIGHT AXILLARY SENTINEL LYMPH NODE BIOPSY;  Surgeon: [name], MD;  Location: [facility];  Service: General;  Laterality: Right;  . Portacath placement N/A 09/19/2014    Procedure: INSERTION PORT-A-CATH;  Surgeon: [name], MD;  Location: [facility];  Service: General;  Laterality: N/A;    FAMILY HISTORY Family History  Problem Relation Age of Onset  . Breast cancer Cousin 63    maternal first cousin  . Colon cancer Maternal Aunt 75  . Throat cancer Maternal Aunt 75    smoker  . Breast cancer Paternal Aunt 53  . Heart disease Mother   . Heart disease Father   . Diabetes type II Father   . Stomach cancer Maternal Uncle     dx in his 14s  . Colon cancer Cousin 77     maternal  first cousin  . Prostate cancer Cousin 82    paternal first cousin  . Breast cancer Paternal Aunt     dx in her 49s  . Breast cancer Other     mother's paternal first cousin  . Colon cancer Other 22    MGF's sister  . Brain cancer Other 86    MGFs sister  . Breast cancer Other     MGF's paternal aunt  . Prostate cancer Other     MGF's paternal first cousin   the patient's father died at the age of 3 from heart failure in the setting of diabetes. The patient's mother is currently 25 years old. The patient has 3 brothers, 2 sisters. There is no history of  breast, ovarian, or colon cancer in first-degree relatives. However, on the mother's side the patient has 3 relatives with breast cancer (a cousin diagnosed age 27, a second cousin diagnosed age 45, and a great aunt). There is also a history of colon cancer diagnosed age around age 15 in a great aunt and small intestinal cancer in an aunt diagnosed age 56. On the father's side there are 2 cousins with breast cancer diagnosed age 15 and 50, and a cousin with colon cancer diagnosed age 3  GYNECOLOGIC HISTORY:  No LMP recorded. Patient has had an ablation. Menarche age 70, the patient carried 2 children to term, the first at age 77. After the first live birth she had a premature girl who died after one day (she had been a breech birth) and she also had 3 miscarriages. The patient underwent endometrial ablation in 1998 and has had no periods since that time.  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 Her husband Braulio Conte works for [employer] in the [employer] and Manufacturing systems engineer. Son Harvie Bridge lives in [place] and works in Education officer, community. He was an Gaffer) and son Lynelle Smoke is 73 and works for [employer]. He also lives in [place]. The patient has one granddaughter. The patient is a Psychologist, forensic    ADVANCED DIRECTIVES: Not in  place   HEALTH MAINTENANCE: History  Substance Use Topics  . Smoking status: Never Smoker   . Smokeless tobacco: Never Used  . Alcohol Use: No     Colonoscopy: June 2015/May and last repeat planned 10/03/2014  PAP: 2015  Bone density:  Lipid panel:  Allergies  Allergen Reactions  . Morphine And Related Nausea And Vomiting  . Sulfa Antibiotics Hives    Current Outpatient Prescriptions  Medication Sig Dispense Refill  . loratadine (CLARITIN) 10 MG tablet Take 10 mg by mouth daily.    . rivaroxaban (XARELTO) 20 MG TABS tablet Take 20 mg by mouth every morning.    Marland Kitchen acetaminophen-codeine (TYLENOL #3) 300-30 MG per tablet Take 1 tablet by mouth every 8 (eight) hours as needed for moderate pain.   0  . dexamethasone (DECADRON) 4 MG tablet Take 2 tablets (8 mg total) by mouth 2 (two) times daily. Start the day before Taxotere. Then again the day after chemo for 3 days. (Patient not taking: Reported on 10/23/2014) 30 tablet 1  . eletriptan (RELPAX) 40 MG tablet Take 40 mg by mouth as needed for migraine or headache. One tablet by mouth at onset of headache. May repeat in 2 hours if headache persists or recurs.    . fluconazole (DIFLUCAN) 100 MG tablet     . ketoconazole (NIZORAL) 2 % shampoo Apply 1 application topically 2 (two) times a week. 120 mL 0  . lidocaine-prilocaine (EMLA) cream Apply to affected area once (Patient not taking: Reported on 10/23/2014) 30 g 3  . LORazepam (ATIVAN) 0.5 MG tablet Take 1 tablet (0.5 mg total) by mouth every 6 (six) hours as needed (Nausea or vomiting). (Patient not taking: Reported on 10/23/2014) 30 tablet 0  . ondansetron (ZOFRAN) 8 MG tablet Take 1 tablet (8 mg total) by mouth 2 (two) times daily. Start the day after chemo for 3 days. Then take as needed for nausea or vomiting. (Patient not taking: Reported on 10/15/2014) 30 tablet 1  . oxyCODONE (OXY IR/ROXICODONE) 5 MG immediate release tablet     . oxyCODONE-acetaminophen (PERCOCET) 10-325 MG  per  tablet Take 1 tablet by mouth every 6 (six) hours as needed for pain. (Patient not taking: Reported on 09/19/2014) 20 tablet 0  . prochlorperazine (COMPAZINE) 10 MG tablet Take 1 tablet (10 mg total) by mouth every 6 (six) hours as needed (Nausea or vomiting). (Patient not taking: Reported on 10/23/2014) 30 tablet 1  . topiramate (TOPAMAX) 50 MG tablet Take 1 tablet (50 mg total) by mouth 2 (two) times daily. For first week start with 1/2 tab twice daily. (Patient not taking: Reported on 10/23/2014) 60 tablet 2   No current facility-administered medications for this visit.    OBJECTIVE: Middle-aged white woman who walks with difficulty Filed Vitals:   10/23/14 1341  BP: 128/78  Pulse: 100  Temp: 97.9 F (36.6 C)  Resp: 18     Body mass index is 41.62 kg/(m^2).    ECOG FS:2 - Symptomatic, <50% confined to bed  Sclerae unicteric, pupils equal and reactive Oropharynx clear and moist-- no thrush No cervical or supraclavicular adenopathy Lungs no rales or rhonchi Heart regular rate and rhythm Abd soft, nontender, positive bowel sounds MSK no focal spinal tenderness, no upper extremity lymphedema Neuro: nonfocal, well oriented, appropriate affect Breasts: deferred  LAB RESULTS:  CMP     Component Value Date/Time   NA 142 10/23/2014 1312   NA 139 09/26/2014 1406   K 3.3* 10/23/2014 1312   K 4.0 09/26/2014 1406   CL 105 09/26/2014 1406   CO2 24 10/23/2014 1312   CO2 24 09/26/2014 1406   GLUCOSE 115 10/23/2014 1312   GLUCOSE 220* 09/26/2014 1406   BUN 6.5* 10/23/2014 1312   BUN 21 09/26/2014 1406   CREATININE 0.8 10/23/2014 1312   CREATININE 0.72 09/26/2014 1406   CALCIUM 8.4 10/23/2014 1312   CALCIUM 8.6 09/26/2014 1406   PROT 6.1* 10/23/2014 1312   PROT 6.4 09/26/2014 1406   ALBUMIN 3.7 10/23/2014 1312   ALBUMIN 3.9 09/26/2014 1406   AST 22 10/23/2014 1312   AST 15 09/26/2014 1406   ALT 27 10/23/2014 1312   ALT 13 09/26/2014 1406   ALKPHOS 93 10/23/2014 1312   ALKPHOS 83  09/26/2014 1406   BILITOT 0.58 10/23/2014 1312   BILITOT 0.7 09/26/2014 1406   GFRNONAA >90 09/19/2014 0707   GFRAA >90 09/19/2014 0707    INo results found for: SPEP, UPEP  Lab Results  Component Value Date   WBC 12.9* 10/23/2014   NEUTROABS 8.7* 10/23/2014   HGB 12.2 10/23/2014   HCT 36.5 10/23/2014   MCV 91.5 10/23/2014   PLT 181 10/23/2014      Chemistry      Component Value Date/Time   NA 142 10/23/2014 1312   NA 139 09/26/2014 1406   K 3.3* 10/23/2014 1312   K 4.0 09/26/2014 1406   CL 105 09/26/2014 1406   CO2 24 10/23/2014 1312   CO2 24 09/26/2014 1406   BUN 6.5* 10/23/2014 1312   BUN 21 09/26/2014 1406   CREATININE 0.8 10/23/2014 1312   CREATININE 0.72 09/26/2014 1406      Component Value Date/Time   CALCIUM 8.4 10/23/2014 1312   CALCIUM 8.6 09/26/2014 1406   ALKPHOS 93 10/23/2014 1312   ALKPHOS 83 09/26/2014 1406   AST 22 10/23/2014 1312   AST 15 09/26/2014 1406   ALT 27 10/23/2014 1312   ALT 13 09/26/2014 1406   BILITOT 0.58 10/23/2014 1312   BILITOT 0.7 09/26/2014 1406       No results found for: LABCA2  No components found for: LABCA125  No results for input(s): INR in the last 168 hours.  Urinalysis    Component Value Date/Time   COLORURINE AMBER* 02/21/2014 1540   APPEARANCEUR CLOUDY* 02/21/2014 1540   LABSPEC 1.029 02/21/2014 1540   PHURINE 5.0 02/21/2014 1540   GLUCOSEU NEGATIVE 02/21/2014 1540   HGBUR SMALL* 02/21/2014 1540   BILIRUBINUR SMALL* 02/21/2014 1540   KETONESUR 15* 02/21/2014 1540   PROTEINUR NEGATIVE 02/21/2014 1540   UROBILINOGEN 1.0 02/21/2014 1540   NITRITE POSITIVE* 02/21/2014 1540   LEUKOCYTESUR TRACE* 02/21/2014 1540    STUDIES: No results found.  ASSESSMENT: 54 y.o. [place] woman status post right lumpectomy and sentinel lymph node sampling 08/27/2014 for a pT1c pN0, stage IA invasive ductal carcinoma, grade 3, estrogen and progesterone receptor positive, HER-2 negative, with an MIB-1 of 66%  (1)  adjuvant chemotherapy will consist of cyclophosphamide and docetaxel every 21 days 4, with onpro support  (2) adjuvant radiation will follow chemotherapy  (3) antiestrogen therapy for 10 years will follow radiation  (4) genetics panel negative for any mutations in the following genes: APC, ATM, AXIN2, BARD1, BMPR1A, BRCA1, BRCA2, BRIP1, CDH1, CDK4, CDKN2A, CHEK2, EPCAM, FANCC, MLH1, MSH2, MSH6, MUTYH, NBN, PALB2, PMS2, POLD1, POLE, PTEN, RAD51C, RAD51D, SCG5/GREM1, SMAD4, STK11, TP53, VHL, and XRCC2.   (5) a hypercoagulable panel will be sent with next lab draw  (6) a nutrition consult has been requested   PLAN: Teresa Aguilar feeling fair today. The labs were reviewed in detail and were stable. For her head irritation I have sent in a prescription for ketoconozole shampoo. She will following the directions on the bottle, and she will let us know if this is effective.   I have placed a referral back to Dr. Cristal Generous office to reevaluate her port. It is causing her throat discomfort and may be the reason for the spasms that create her cough.   Teresa Aguilar will return in 2 weeks for cycle 3 of treatment. She understands and agrees with this plan. She knows the goal of treatment in her case is cure. She has been encouraged to call with any issues that might arise before her next visit.   Teresa Panda, NP   10/23/2014 2:56 PM

## 2014-11-05 ENCOUNTER — Other Ambulatory Visit: Payer: Self-pay

## 2014-11-05 ENCOUNTER — Ambulatory Visit (HOSPITAL_BASED_OUTPATIENT_CLINIC_OR_DEPARTMENT_OTHER): Payer: BC Managed Care – PPO

## 2014-11-05 ENCOUNTER — Ambulatory Visit: Payer: BC Managed Care – PPO

## 2014-11-05 ENCOUNTER — Encounter: Payer: Self-pay | Admitting: Nurse Practitioner

## 2014-11-05 ENCOUNTER — Ambulatory Visit (HOSPITAL_BASED_OUTPATIENT_CLINIC_OR_DEPARTMENT_OTHER): Payer: BC Managed Care – PPO | Admitting: Nurse Practitioner

## 2014-11-05 ENCOUNTER — Other Ambulatory Visit: Payer: Self-pay | Admitting: Oncology

## 2014-11-05 VITALS — BP 145/81 | HR 100 | Temp 97.6°F | Resp 18 | Ht 67.0 in | Wt 270.5 lb

## 2014-11-05 DIAGNOSIS — Z5189 Encounter for other specified aftercare: Secondary | ICD-10-CM | POA: Diagnosis not present

## 2014-11-05 DIAGNOSIS — C50311 Malignant neoplasm of lower-inner quadrant of right female breast: Secondary | ICD-10-CM

## 2014-11-05 DIAGNOSIS — Z5111 Encounter for antineoplastic chemotherapy: Secondary | ICD-10-CM | POA: Diagnosis not present

## 2014-11-05 DIAGNOSIS — I82491 Acute embolism and thrombosis of other specified deep vein of right lower extremity: Secondary | ICD-10-CM

## 2014-11-05 DIAGNOSIS — G43109 Migraine with aura, not intractable, without status migrainosus: Secondary | ICD-10-CM

## 2014-11-05 DIAGNOSIS — M532X8 Spinal instabilities, sacral and sacrococcygeal region: Secondary | ICD-10-CM

## 2014-11-05 DIAGNOSIS — Z6841 Body Mass Index (BMI) 40.0 and over, adult: Secondary | ICD-10-CM

## 2014-11-05 LAB — CBC WITH DIFFERENTIAL/PLATELET
BASO%: 0 % (ref 0.0–2.0)
BASOS ABS: 0 10*3/uL (ref 0.0–0.1)
EOS ABS: 0 10*3/uL (ref 0.0–0.5)
EOS%: 0 % (ref 0.0–7.0)
HEMATOCRIT: 37.2 % (ref 34.8–46.6)
HGB: 12.4 g/dL (ref 11.6–15.9)
LYMPH%: 4.8 % — ABNORMAL LOW (ref 14.0–49.7)
MCH: 31.3 pg (ref 25.1–34.0)
MCHC: 33.3 g/dL (ref 31.5–36.0)
MCV: 93.9 fL (ref 79.5–101.0)
MONO#: 0.4 10*3/uL (ref 0.1–0.9)
MONO%: 3.8 % (ref 0.0–14.0)
NEUT%: 91.4 % — ABNORMAL HIGH (ref 38.4–76.8)
NEUTROS ABS: 10 10*3/uL — AB (ref 1.5–6.5)
NRBC: 0 % (ref 0–0)
Platelets: 278 10*3/uL (ref 145–400)
RBC: 3.96 10*6/uL (ref 3.70–5.45)
RDW: 16 % — AB (ref 11.2–14.5)
WBC: 11 10*3/uL — AB (ref 3.9–10.3)
lymph#: 0.5 10*3/uL — ABNORMAL LOW (ref 0.9–3.3)

## 2014-11-05 LAB — COMPREHENSIVE METABOLIC PANEL (CC13)
ALT: 22 U/L (ref 0–55)
AST: 11 U/L (ref 5–34)
Albumin: 3.7 g/dL (ref 3.5–5.0)
Alkaline Phosphatase: 80 U/L (ref 40–150)
Anion Gap: 12 mEq/L — ABNORMAL HIGH (ref 3–11)
BUN: 12.3 mg/dL (ref 7.0–26.0)
CALCIUM: 9.2 mg/dL (ref 8.4–10.4)
CO2: 20 mEq/L — ABNORMAL LOW (ref 22–29)
CREATININE: 0.8 mg/dL (ref 0.6–1.1)
Chloride: 109 mEq/L (ref 98–109)
EGFR: 85 mL/min/{1.73_m2} — ABNORMAL LOW (ref >90)
GLUCOSE: 273 mg/dL — AB (ref 70–140)
Potassium: 4.1 mEq/L (ref 3.5–5.1)
SODIUM: 141 meq/L (ref 136–145)
Total Bilirubin: 0.43 mg/dL (ref 0.20–1.20)
Total Protein: 6.7 g/dL (ref 6.4–8.3)

## 2014-11-05 MED ORDER — SODIUM CHLORIDE 0.9 % IJ SOLN
10.0000 mL | INTRAMUSCULAR | Status: DC | PRN
  Administered 2014-11-05: 10 mL via INTRAVENOUS
  Filled 2014-11-05: qty 10

## 2014-11-05 MED ORDER — DOCETAXEL CHEMO INJECTION 160 MG/16ML
75.0000 mg/m2 | Freq: Once | INTRAVENOUS | Status: AC
  Administered 2014-11-05: 180 mg via INTRAVENOUS
  Filled 2014-11-05: qty 18

## 2014-11-05 MED ORDER — SODIUM CHLORIDE 0.9 % IV SOLN
Freq: Once | INTRAVENOUS | Status: AC
  Administered 2014-11-05: 11:00:00 via INTRAVENOUS
  Filled 2014-11-05: qty 8

## 2014-11-05 MED ORDER — HEPARIN SOD (PORK) LOCK FLUSH 100 UNIT/ML IV SOLN
500.0000 [IU] | Freq: Once | INTRAVENOUS | Status: AC | PRN
  Administered 2014-11-05: 500 [IU]
  Filled 2014-11-05: qty 5

## 2014-11-05 MED ORDER — SODIUM CHLORIDE 0.9 % IV SOLN
Freq: Once | INTRAVENOUS | Status: AC
  Administered 2014-11-05: 10:00:00 via INTRAVENOUS

## 2014-11-05 MED ORDER — PEGFILGRASTIM 6 MG/0.6ML ~~LOC~~ PSKT
6.0000 mg | PREFILLED_SYRINGE | Freq: Once | SUBCUTANEOUS | Status: AC
  Administered 2014-11-05: 6 mg via SUBCUTANEOUS
  Filled 2014-11-05: qty 0.6

## 2014-11-05 MED ORDER — CYCLOPHOSPHAMIDE CHEMO INJECTION 1 GM
600.0000 mg/m2 | Freq: Once | INTRAMUSCULAR | Status: AC
  Administered 2014-11-05: 1480 mg via INTRAVENOUS
  Filled 2014-11-05: qty 74

## 2014-11-05 MED ORDER — SODIUM CHLORIDE 0.9 % IJ SOLN
10.0000 mL | INTRAMUSCULAR | Status: DC | PRN
  Administered 2014-11-05: 10 mL
  Filled 2014-11-05: qty 10

## 2014-11-05 NOTE — Progress Notes (Signed)
[facility]  [phone] [phone]) [phone]     ID: JENNA ROUTZAHN DOB: 07-23-1960  MR#: [id number]  JYN#:[id number]  Patient Care Team: Stephens Shire, MD as PCP - General (Family Medicine) PCP: Stephens Shire, MD GYN: [name] MD SU: [name] MD OTHER MD: [name] MD, Phylliss Bob MD, Jamie Kato MD  CHIEF COMPLAINT: Estrogen receptor positive breast cancer  CURRENT TREATMENT: Adjuvant chemotherapy   BREAST CANCER HISTORY: "[name]" had routine screening mammography January 2016 in Dr. Tonette Bihari office showing a possible change in the right breast. On 07/24/2014 at the breast Center she underwent right digital mammography and ultrasonography. The breast density was category A. In the lower inner quadrant of the right breast there was a 2 cm mass which was palpable by exam. Ultrasound confirmed a 1.5 cm irregular hypoechoic mass at the 3:30 o'clock position 12 cm from the nipple. There was no right axillary adenopathy.  Biopsy of the mass in question 07/24/2014 showed ([id number]) invasive ductal carcinoma, grade 2 or 3, estrogen receptor 98% positive, progesterone receptor 76% positive, both with strong staining intensity, with an MIB-1 of 66% and no HER-2 amplification by FISH.  The patient's case was discussed at the multidisciplinary breast cancer conference 08/08/2014 and it was felt the patient would benefit from breast conserving surgery and likely would need an Oncotype to decide on optimal systemic therapy. She would need radiation and hormones. The question of genetics counseling was also raised.  On 08/27/2014 the patient underwent right lumpectomy and sentinel lymph node sampling. The final pathology ([id number]) confirmed invasive ductal carcinoma, grade 3, measuring 1.9 cm. There was evidence of lymphovascular and perineural invasion. Margins were negative but close, the closest margin for the in situ component being 1 mm.  The single sentinel lymph node was negative. Repeat HER-2 was again negative, with a signals ratio of 0.97 and a number per cell of 1.60.  The patient's subsequent history is as detailed below  INTERVAL HISTORY: [name] returns today for follow up of her breast cancer, accompanied by a friend.  Today is day 1, cycle 3 of 4 planned cycles of cyclophosphamide and docetaxel given every 3 weeks, with neulasta on day 2 for granulocyte support.   REVIEW OF SYSTEMS: [name] denies fevers, chills, nausea, vomiting, or changes in bladder habits. Her bowels are moving better with miralax. Her appetite is improved. She shampoo prescribed at her last visit helped with the scalp irritation. Her nail beds are tender, but she denies neuropathy symptoms. She soreness and discomfort to her port and right neck insertion site is improved. She is no longer coughing. A detailed review of systems is otherwise stable.   PAST MEDICAL HISTORY: Past Medical History  Diagnosis Date  . Hypoxia     after surgery  . DVT (deep venous thrombosis) 03/2014    R leg, post op- on Xarelto   . Hypertension     pt. reports that she has been higher in the past & again now but doesn't take any med.,never has for ^BP, pt. stating that BP is high now b/c of her pain in her back.    . H/O cardiovascular stress test 2012     test done for stress from her Father dying, had chest pain ,pt. had a stress/echo  test, told it was wnl  . Anxiety     relative for to pain & frustration of prev. hospitalization   . PONV (postoperative nausea and vomiting)  low O2 sats, < 50% post op  . Family history of adverse reaction to anesthesia     PONV  . Gestational diabetes 1986; 1996  . Migraines     "at least q other week" (08/27/2014)  . Arthritis     degenerative lumbar spine    . Chronic back pain   . Breast cancer of lower-inner quadrant of right female breast   . Colon cancer   . Breast cancer 2016  . Family history of breast cancer    . Family history of colon cancer     PAST SURGICAL HISTORY: Past Surgical History  Procedure Laterality Date  . Endometrial ablation  ~ 1998  . Colonoscopy w/ polypectomy  2015    found tubulovillous adenoma with focal high grade displasia  . Plantar fascia release Right ~ 2000  . Eye muscle surgery Bilateral ~ 1965    to fix cross-eye as child  . Sacroiliac joint fusion Right 02/22/2014    Procedure: SACROILIAC JOINT FUSION;  Surgeon: Sinclair Ship, MD;  Location: [facility];  Service: Orthopedics;  Laterality: Right;  Right sided sacroiliac joint fusion  . Radiofrequency ablation nerves      x 2 to nerves in her lower back  . Dental surgery  ~ 2011    "replaced bone graft upper jaw; put 2 implants in"  . Tubal ligation  1996  . Axillary sentinel node biopsy Right 08/27/2014    [facility] End[facility] 08/27/2014  . Tonsillectomy  1988  . Excisional hemorrhoidectomy  2015  . Back surgery    . Breast biopsy Right 07/24/2014    core biopsy  . Breast lumpectomy Right 08/27/2014    [facility] Endo 08/27/2014  . Radioactive seed guided mastectomy with axillary sentinel lymph node biopsy Right 08/27/2014    Procedure: RADIOACTIVE SEED GUIDED RIGHT BREAST LUMPECTOMY WITH RIGHT AXILLARY SENTINEL LYMPH NODE BIOPSY;  Surgeon: [name], MD;  Location: [facility];  Service: General;  Laterality: Right;  . Portacath placement N/A 09/19/2014    Procedure: INSERTION PORT-A-CATH;  Surgeon: [name], MD;  Location: [facility] OR;  Service: General;  Laterality: N/A;    FAMILY HISTORY Family History  Problem Relation Age of Onset  . Breast cancer Cousin 19    maternal first cousin  . Colon cancer Maternal Aunt 75  . Throat cancer Maternal Aunt 75    smoker  . Breast cancer Paternal Aunt 61  . Heart disease Mother   . Heart disease Father   . Diabetes type II Father   . Stomach cancer Maternal Uncle     dx in his 65s  . Colon cancer Cousin 6     maternal first cousin  . Prostate cancer Cousin 6    paternal  first cousin  . Breast cancer Paternal Aunt     dx in her 37s  . Breast cancer Other     mother's paternal first cousin  . Colon cancer Other 36    MGF's sister  . Brain cancer Other 19    MGFs sister  . Breast cancer Other     MGF's paternal aunt  . Prostate cancer Other     MGF's paternal first cousin   the patient's father died at the age of 34 from heart failure in the setting of diabetes. The patient's mother is currently 90 years old. The patient has 3 brothers, 2 sisters. There is no history of breast, ovarian, or colon cancer in first-degree relatives. However, on the mother's side the patient  has 3 relatives with breast cancer (a cousin diagnosed age 60, a second cousin diagnosed age 54, and a great aunt). There is also a history of colon cancer diagnosed age around age 67 in a great aunt and small intestinal cancer in an aunt diagnosed age 55. On the father's side there are 2 cousins with breast cancer diagnosed age 16 and 53, and a cousin with colon cancer diagnosed age 34  GYNECOLOGIC HISTORY:  No LMP recorded. Patient has had an ablation. Menarche age 1, the patient carried 2 children to term, the first at age 59. After the first live birth she had a premature girl who died after one day (she had been a breech birth) and she also had 3 miscarriages. The patient underwent endometrial ablation in 1998 and has had no periods since that time.  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 Her husband Braulio Conte works for [employer] in the [place] and Manufacturing systems engineer. Son Harvie Bridge lives in [place] and works in Education officer, community. He was an Gaffer) and son Lynelle Smoke is 9 and works for [employer]. He also lives in [place]. The patient has one granddaughter. The patient is a Psychologist, forensic    ADVANCED DIRECTIVES: Not in place   HEALTH MAINTENANCE: History  Substance Use  Topics  . Smoking status: Never Smoker   . Smokeless tobacco: Never Used  . Alcohol Use: No     Colonoscopy: June 2015/May and last repeat planned 10/03/2014  PAP: 2015  Bone density:  Lipid panel:  Allergies  Allergen Reactions  . Morphine And Related Nausea And Vomiting  . Sulfa Antibiotics Hives    Current Outpatient Prescriptions  Medication Sig Dispense Refill  . dexamethasone (DECADRON) 4 MG tablet Take 2 tablets (8 mg total) by mouth 2 (two) times daily. Start the day before Taxotere. Then again the day after chemo for 3 days. 30 tablet 1  . ketoconazole (NIZORAL) 2 % shampoo Apply 1 application topically 2 (two) times a week. 120 mL 0  . lidocaine-prilocaine (EMLA) cream Apply to affected area once 30 g 3  . loratadine (CLARITIN) 10 MG tablet Take 10 mg by mouth daily.    Marland Kitchen LORazepam (ATIVAN) 0.5 MG tablet Take 1 tablet (0.5 mg total) by mouth every 6 (six) hours as needed (Nausea or vomiting). 30 tablet 0  . acetaminophen-codeine (TYLENOL #3) 300-30 MG per tablet Take 1 tablet by mouth every 8 (eight) hours as needed for moderate pain.   0  . eletriptan (RELPAX) 40 MG tablet Take 40 mg by mouth as needed for migraine or headache. One tablet by mouth at onset of headache. May repeat in 2 hours if headache persists or recurs.    . fluconazole (DIFLUCAN) 100 MG tablet     . ondansetron (ZOFRAN) 8 MG tablet Take 1 tablet (8 mg total) by mouth 2 (two) times daily. Start the day after chemo for 3 days. Then take as needed for nausea or vomiting. (Patient not taking: Reported on 10/15/2014) 30 tablet 1  . oxyCODONE-acetaminophen (PERCOCET) 10-325 MG per tablet Take 1 tablet by mouth every 6 (six) hours as needed for pain. (Patient not taking: Reported on 09/19/2014) 20 tablet 0  . prochlorperazine (COMPAZINE) 10 MG tablet Take 1 tablet (10 mg total) by mouth every 6 (six) hours as needed (Nausea or vomiting). (Patient not taking: Reported on 10/23/2014) 30 tablet 1  . topiramate (TOPAMAX)  50  MG tablet Take 1 tablet (50 mg total) by mouth 2 (two) times daily. For first week start with 1/2 tab twice daily. (Patient not taking: Reported on 10/23/2014) 60 tablet 2   No current facility-administered medications for this visit.    OBJECTIVE: Middle-aged white woman who walks with difficulty Filed Vitals:   11/05/14 0935  BP: 145/81  Pulse: 100  Temp: 97.6 F (36.4 C)  Resp: 18     Body mass index is 42.36 kg/(m^2).    ECOG FS:2 - Symptomatic, <50% confined to bed  Skin: warm, dry  HEENT: sclerae anicteric, conjunctivae pink, oropharynx clear. No thrush or mucositis.  Lymph Nodes: No cervical or supraclavicular lymphadenopathy  Lungs: clear to auscultation bilaterally, no rales, wheezes, or rhonci  Heart: regular rate and rhythm  Abdomen: round, soft, non tender, positive bowel sounds  Musculoskeletal: No focal spinal tenderness, no peripheral edema  Neuro: non focal, well oriented, positive affect  Breasts: deferred  LAB RESULTS:  CMP     Component Value Date/Time   NA 141 11/05/2014 0810   NA 139 09/26/2014 1406   K 4.1 11/05/2014 0810   K 4.0 09/26/2014 1406   CL 105 09/26/2014 1406   CO2 20* 11/05/2014 0810   CO2 24 09/26/2014 1406   GLUCOSE 273* 11/05/2014 0810   GLUCOSE 220* 09/26/2014 1406   BUN 12.3 11/05/2014 0810   BUN 21 09/26/2014 1406   CREATININE 0.8 11/05/2014 0810   CREATININE 0.72 09/26/2014 1406   CALCIUM 9.2 11/05/2014 0810   CALCIUM 8.6 09/26/2014 1406   PROT 6.7 11/05/2014 0810   PROT 6.4 09/26/2014 1406   ALBUMIN 3.7 11/05/2014 0810   ALBUMIN 3.9 09/26/2014 1406   AST 11 11/05/2014 0810   AST 15 09/26/2014 1406   ALT 22 11/05/2014 0810   ALT 13 09/26/2014 1406   ALKPHOS 80 11/05/2014 0810   ALKPHOS 83 09/26/2014 1406   BILITOT 0.43 11/05/2014 0810   BILITOT 0.7 09/26/2014 1406   GFRNONAA >90 09/19/2014 0707   GFRAA >90 09/19/2014 0707    INo results found for: SPEP, UPEP  Lab Results  Component Value Date   WBC 11.0*  11/05/2014   NEUTROABS 10.0* 11/05/2014   HGB 12.4 11/05/2014   HCT 37.2 11/05/2014   MCV 93.9 11/05/2014   PLT 278 11/05/2014      Chemistry      Component Value Date/Time   NA 141 11/05/2014 0810   NA 139 09/26/2014 1406   K 4.1 11/05/2014 0810   K 4.0 09/26/2014 1406   CL 105 09/26/2014 1406   CO2 20* 11/05/2014 0810   CO2 24 09/26/2014 1406   BUN 12.3 11/05/2014 0810   BUN 21 09/26/2014 1406   CREATININE 0.8 11/05/2014 0810   CREATININE 0.72 09/26/2014 1406      Component Value Date/Time   CALCIUM 9.2 11/05/2014 0810   CALCIUM 8.6 09/26/2014 1406   ALKPHOS 80 11/05/2014 0810   ALKPHOS 83 09/26/2014 1406   AST 11 11/05/2014 0810   AST 15 09/26/2014 1406   ALT 22 11/05/2014 0810   ALT 13 09/26/2014 1406   BILITOT 0.43 11/05/2014 0810   BILITOT 0.7 09/26/2014 1406       No results found for: LABCA2  No components found for: LABCA125  No results for input(s): INR in the last 168 hours.  Urinalysis    Component Value Date/Time   COLORURINE AMBER* 02/21/2014 1540   APPEARANCEUR CLOUDY* 02/21/2014 1540   LABSPEC 1.029 02/21/2014 1540  PHURINE 5.0 02/21/2014 1540   GLUCOSEU NEGATIVE 02/21/2014 1540   HGBUR SMALL* 02/21/2014 1540   BILIRUBINUR SMALL* 02/21/2014 1540   KETONESUR 15* 02/21/2014 1540   PROTEINUR NEGATIVE 02/21/2014 1540   UROBILINOGEN 1.0 02/21/2014 1540   NITRITE POSITIVE* 02/21/2014 1540   LEUKOCYTESUR TRACE* 02/21/2014 1540    STUDIES: No results found.  ASSESSMENT: 54 y.o. [place] woman status post right lumpectomy and sentinel lymph node sampling 08/27/2014 for a pT1c pN0, stage IA invasive ductal carcinoma, grade 3, estrogen and progesterone receptor positive, HER-2 negative, with an MIB-1 of 66%  (1) adjuvant chemotherapy will consist of cyclophosphamide and docetaxel every 21 days 4, with onpro support  (2) adjuvant radiation will follow chemotherapy  (3) antiestrogen therapy for 10 years will follow radiation  (4)  genetics panel negative for any mutations in the following genes: APC, ATM, AXIN2, BARD1, BMPR1A, BRCA1, BRCA2, BRIP1, CDH1, CDK4, CDKN2A, CHEK2, EPCAM, FANCC, MLH1, MSH2, MSH6, MUTYH, NBN, PALB2, PMS2, POLD1, POLE, PTEN, RAD51C, RAD51D, SCG5/GREM1, SMAD4, STK11, TP53, VHL, and XRCC2.   (5) a hypercoagulable panel will be sent with next lab draw  (6) a nutrition consult has been requested   PLAN: [name] is doing well today. The labs were reviewed in detail and were entirely stable. She will proceed with cycle 3 of cyclophosphamide and docetaxel as planned today.   [name] will return in 1 week for labs and a nadir visit. She understands and agrees with this plan. She knows the goal of treatment in her case is cure. She has been encouraged to call with any issues that might arise before her next visit here.     Laurie Panda, NP   11/05/2014 10:13 AM

## 2014-11-05 NOTE — Patient Instructions (Signed)
[facility] Discharge Instructions for Patients Receiving Chemotherapy  Today you received the following chemotherapy agents Taxotere/Carboplatin  To help prevent nausea and vomiting after your treatment, we encourage you to take your nausea medication as directed.   If you develop nausea and vomiting that is not controlled by your nausea medication, call the clinic.   BELOW ARE SYMPTOMS THAT SHOULD BE REPORTED IMMEDIATELY:  *FEVER GREATER THAN 100.5 F  *CHILLS WITH OR WITHOUT FEVER  NAUSEA AND VOMITING THAT IS NOT CONTROLLED WITH YOUR NAUSEA MEDICATION  *UNUSUAL SHORTNESS OF BREATH  *UNUSUAL BRUISING OR BLEEDING  TENDERNESS IN MOUTH AND THROAT WITH OR WITHOUT PRESENCE OF ULCERS  *URINARY PROBLEMS  *BOWEL PROBLEMS  UNUSUAL RASH Items with * indicate a potential emergency and should be followed up as soon as possible.  Feel free to call the clinic you have any questions or concerns. The clinic phone number is [phone].  Please show the [place] at check-in to the Emergency Department and triage nurse.

## 2014-11-05 NOTE — Patient Instructions (Signed)

## 2014-11-07 ENCOUNTER — Ambulatory Visit: Payer: Self-pay

## 2014-11-13 ENCOUNTER — Ambulatory Visit (HOSPITAL_BASED_OUTPATIENT_CLINIC_OR_DEPARTMENT_OTHER): Payer: BC Managed Care – PPO

## 2014-11-13 ENCOUNTER — Other Ambulatory Visit: Payer: Self-pay

## 2014-11-13 ENCOUNTER — Ambulatory Visit (HOSPITAL_BASED_OUTPATIENT_CLINIC_OR_DEPARTMENT_OTHER): Payer: BC Managed Care – PPO | Admitting: Nurse Practitioner

## 2014-11-13 ENCOUNTER — Encounter: Payer: Self-pay | Admitting: Nurse Practitioner

## 2014-11-13 ENCOUNTER — Other Ambulatory Visit (HOSPITAL_BASED_OUTPATIENT_CLINIC_OR_DEPARTMENT_OTHER): Payer: BC Managed Care – PPO

## 2014-11-13 VITALS — BP 123/78 | HR 90 | Temp 97.7°F | Resp 19 | Ht 67.0 in | Wt 266.2 lb

## 2014-11-13 DIAGNOSIS — Z170 Estrogen receptor positive status [ER+]: Secondary | ICD-10-CM | POA: Diagnosis not present

## 2014-11-13 DIAGNOSIS — C50311 Malignant neoplasm of lower-inner quadrant of right female breast: Secondary | ICD-10-CM | POA: Diagnosis not present

## 2014-11-13 DIAGNOSIS — H04129 Dry eye syndrome of unspecified lacrimal gland: Secondary | ICD-10-CM | POA: Diagnosis not present

## 2014-11-13 DIAGNOSIS — I82491 Acute embolism and thrombosis of other specified deep vein of right lower extremity: Secondary | ICD-10-CM

## 2014-11-13 DIAGNOSIS — M532X8 Spinal instabilities, sacral and sacrococcygeal region: Secondary | ICD-10-CM

## 2014-11-13 DIAGNOSIS — Z6841 Body Mass Index (BMI) 40.0 and over, adult: Secondary | ICD-10-CM

## 2014-11-13 DIAGNOSIS — R12 Heartburn: Secondary | ICD-10-CM

## 2014-11-13 DIAGNOSIS — G43109 Migraine with aura, not intractable, without status migrainosus: Secondary | ICD-10-CM

## 2014-11-13 DIAGNOSIS — Z95828 Presence of other vascular implants and grafts: Secondary | ICD-10-CM

## 2014-11-13 LAB — CBC WITH DIFFERENTIAL/PLATELET
BASO%: 0.6 % (ref 0.0–2.0)
BASOS ABS: 0.1 10*3/uL (ref 0.0–0.1)
EOS ABS: 0 10*3/uL (ref 0.0–0.5)
EOS%: 0.1 % (ref 0.0–7.0)
HEMATOCRIT: 35.9 % (ref 34.8–46.6)
HGB: 12.1 g/dL (ref 11.6–15.9)
LYMPH%: 15.4 % (ref 14.0–49.7)
MCH: 31.1 pg (ref 25.1–34.0)
MCHC: 33.7 g/dL (ref 31.5–36.0)
MCV: 92.3 fL (ref 79.5–101.0)
MONO#: 2.1 10*3/uL — ABNORMAL HIGH (ref 0.1–0.9)
MONO%: 17.5 % — AB (ref 0.0–14.0)
NEUT%: 66.4 % (ref 38.4–76.8)
NEUTROS ABS: 7.8 10*3/uL — AB (ref 1.5–6.5)
PLATELETS: 168 10*3/uL (ref 145–400)
RBC: 3.89 10*6/uL (ref 3.70–5.45)
RDW: 15.7 % — AB (ref 11.2–14.5)
WBC: 11.8 10*3/uL — ABNORMAL HIGH (ref 3.9–10.3)
lymph#: 1.8 10*3/uL (ref 0.9–3.3)
nRBC: 1 % — ABNORMAL HIGH (ref 0–0)

## 2014-11-13 MED ORDER — SODIUM CHLORIDE 0.9 % IJ SOLN
10.0000 mL | INTRAMUSCULAR | Status: DC | PRN
  Administered 2014-11-13: 10 mL via INTRAVENOUS
  Filled 2014-11-13: qty 10

## 2014-11-13 MED ORDER — HEPARIN SOD (PORK) LOCK FLUSH 100 UNIT/ML IV SOLN
500.0000 [IU] | Freq: Once | INTRAVENOUS | Status: AC
  Administered 2014-11-13: 500 [IU] via INTRAVENOUS
  Filled 2014-11-13: qty 5

## 2014-11-13 MED ORDER — OMEPRAZOLE 40 MG PO CPDR: 40.0000 mg | DELAYED_RELEASE_CAPSULE | Freq: Every day | ORAL | Status: DC

## 2014-11-13 NOTE — Progress Notes (Signed)
Pt inf or labs out of PAC, accessed without any difficulties, pt denies any pain or other issues. Flushes well, no blood return noted. Called and informed Natro, LPN with [facility] [employer]. Pt ok to be deaccessed today and get finger stick by phlembnotomist.

## 2014-11-13 NOTE — Patient Instructions (Signed)

## 2014-11-13 NOTE — Progress Notes (Signed)
[place]  [phone] [phone]) [phone]     ID: Teresa Aguilar DOB: [birth year]  MR#: [id number]  TLX#:[id number]  Patient Care Team: Stephens Shire, MD as PCP - General (Family Medicine) PCP: Stephens Shire, MD GYN: [name] MD SU: [name] MD OTHER MD: [name] MD, Phylliss Bob MD, Jamie Kato MD  CHIEF COMPLAINT: Estrogen receptor positive breast cancer  CURRENT TREATMENT: Adjuvant chemotherapy   BREAST CANCER HISTORY: "Teresa Aguilar" had routine screening mammography January 2016 in Dr. Tonette Bihari office showing a possible change in the right breast. On 07/24/2014 at the breast Center she underwent right digital mammography and ultrasonography. The breast density was category A. In the lower inner quadrant of the right breast there was a 2 cm mass which was palpable by exam. Ultrasound confirmed a 1.5 cm irregular hypoechoic mass at the 3:30 o'clock position 12 cm from the nipple. There was no right axillary adenopathy.  Biopsy of the mass in question 07/24/2014 showed ([id number]) invasive ductal carcinoma, grade 2 or 3, estrogen receptor 98% positive, progesterone receptor 76% positive, both with strong staining intensity, with an MIB-1 of 66% and no HER-2 amplification by FISH.  The patient's case was discussed at the multidisciplinary breast cancer conference 08/08/2014 and it was felt the patient would benefit from breast conserving surgery and likely would need an Oncotype to decide on optimal systemic therapy. She would need radiation and hormones. The question of genetics counseling was also raised.  On 08/27/2014 the patient underwent right lumpectomy and sentinel lymph node sampling. The final pathology ([id number]) confirmed invasive ductal carcinoma, grade 3, measuring 1.9 cm. There was evidence of lymphovascular and perineural invasion. Margins were negative but close, the closest margin for the in situ component being 1 mm.  The single sentinel lymph node was negative. Repeat HER-2 was again negative, with a signals ratio of 0.97 and a number per cell of 1.60.  The patient's subsequent history is as detailed below  INTERVAL HISTORY: Teresa Aguilar returns today for follow up of her breast cancer, accompanied by a friend.  Today is day 8, cycle 3 of 4 planned cycles of cyclophosphamide and docetaxel given every 3 weeks, with neulasta on day 2 for granulocyte support.   REVIEW OF SYSTEMS: Teresa Aguilar denies fevers, chills, or changes in bowel or bladder habits. Her mild queeziness is managed well with ice water. This week she has some sinus symptoms including a runny nose and sore throat. tmax is 99.8. She has been taking claritin daily. Her eyes are dry. She is eating and drinking well, but experienced some intense heartburn over the weekend after having bacon.  A detailed review of systems is otherwise stable.   PAST MEDICAL HISTORY: Past Medical History  Diagnosis Date  . Hypoxia     after surgery  . DVT (deep venous thrombosis) 03/2014    R leg, post op- on Xarelto   . Hypertension     pt. reports that she has been higher in the past & again now but doesn't take any med.,never has for ^BP, pt. stating that BP is high now b/c of her pain in her back.    . H/O cardiovascular stress test 2012     test done for stress from her Father dying, had chest pain ,pt. had a stress/echo  test, told it was wnl  . Anxiety     relative for to pain & frustration of prev. hospitalization   . PONV (postoperative nausea and vomiting)  low O2 sats, < 50% post op  . Family history of adverse reaction to anesthesia     PONV  . Gestational diabetes 1986; 1996  . Migraines     "at least q other week" (08/27/2014)  . Arthritis     degenerative lumbar spine    . Chronic back pain   . Breast cancer of lower-inner quadrant of right female breast   . Colon cancer   . Breast cancer 2016  . Family history of breast cancer   . Family  history of colon cancer     PAST SURGICAL HISTORY: Past Surgical History  Procedure Laterality Date  . Endometrial ablation  ~ 1998  . Colonoscopy w/ polypectomy  2015    found tubulovillous adenoma with focal high grade displasia  . Plantar fascia release Right ~ 2000  . Eye muscle surgery Bilateral ~ 1965    to fix cross-eye as child  . Sacroiliac joint fusion Right 02/22/2014    Procedure: SACROILIAC JOINT FUSION;  Surgeon: Sinclair Ship, MD;  Location: [facility];  Service: Orthopedics;  Laterality: Right;  Right sided sacroiliac joint fusion  . Radiofrequency ablation nerves      x 2 to nerves in her lower back  . Dental surgery  ~ 2011    "replaced bone graft upper jaw; put 2 implants in"  . Tubal ligation  1996  . Axillary sentinel node biopsy Right 08/27/2014    Archie Endo 08/27/2014  . Tonsillectomy  1988  . Excisional hemorrhoidectomy  2015  . Back surgery    . Breast biopsy Right 07/24/2014    core biopsy  . Breast lumpectomy Right 08/27/2014    Archie Endo 08/27/2014  . Radioactive seed guided mastectomy with axillary sentinel lymph node biopsy Right 08/27/2014    Procedure: RADIOACTIVE SEED GUIDED RIGHT BREAST LUMPECTOMY WITH RIGHT AXILLARY SENTINEL LYMPH NODE BIOPSY;  Surgeon: [name], MD;  Location: [facility];  Service: General;  Laterality: Right;  . Portacath placement N/A 09/19/2014    Procedure: INSERTION PORT-A-CATH;  Surgeon: [name], MD;  Location: [facility];  Service: General;  Laterality: N/A;    FAMILY HISTORY Family History  Problem Relation Age of Onset  . Breast cancer Cousin 59    maternal first cousin  . Colon cancer Maternal Aunt 75  . Throat cancer Maternal Aunt 75    smoker  . Breast cancer Paternal Aunt 18  . Heart disease Mother   . Heart disease Father   . Diabetes type II Father   . Stomach cancer Maternal Uncle     dx in his 61s  . Colon cancer Cousin 12     maternal first cousin  . Prostate cancer Cousin 72    paternal first cousin   . Breast cancer Paternal Aunt     dx in her 77s  . Breast cancer Other     mother's paternal first cousin  . Colon cancer Other 79    MGF's sister  . Brain cancer Other 75    MGFs sister  . Breast cancer Other     MGF's paternal aunt  . Prostate cancer Other     MGF's paternal first cousin   the patient's father died at the age of 72 from heart failure in the setting of diabetes. The patient's mother is currently 37 years old. The patient has 3 brothers, 2 sisters. There is no history of breast, ovarian, or colon cancer in first-degree relatives. However, on the mother's side the patient  has 3 relatives with breast cancer (a cousin diagnosed age 31, a second cousin diagnosed age 41, and a great aunt). There is also a history of colon cancer diagnosed age around age 49 in a great aunt and small intestinal cancer in an aunt diagnosed age 19. On the father's side there are 2 cousins with breast cancer diagnosed age 4 and 6, and a cousin with colon cancer diagnosed age 25  GYNECOLOGIC HISTORY:  No LMP recorded. Patient has had an ablation. Menarche age 72, the patient carried 2 children to term, the first at age 68. After the first live birth she had a premature girl who died after one day (she had been a breech birth) and she also had 3 miscarriages. The patient underwent endometrial ablation in 1998 and has had no periods since that time.  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 Her husband Braulio Conte works for [employer] in the [place] and Manufacturing systems engineer. Son Harvie Bridge lives in [place] and works in Education officer, community. He was an Gaffer) and son Lynelle Smoke is 76 and works for [employer]. He also lives in [place]. The patient has one granddaughter. The patient is a Psychologist, forensic    ADVANCED DIRECTIVES: Not in place   HEALTH MAINTENANCE: History  Substance Use Topics  . Smoking  status: Never Smoker   . Smokeless tobacco: Never Used  . Alcohol Use: No     Colonoscopy: June 2015/May and last repeat planned 10/03/2014  PAP: 2015  Bone density:  Lipid panel:  Allergies  Allergen Reactions  . Morphine And Related Nausea And Vomiting  . Sulfa Antibiotics Hives    Current Outpatient Prescriptions  Medication Sig Dispense Refill  . dexamethasone (DECADRON) 4 MG tablet Take 2 tablets (8 mg total) by mouth 2 (two) times daily. Start the day before Taxotere. Then again the day after chemo for 3 days. 30 tablet 1  . ketoconazole (NIZORAL) 2 % shampoo Apply 1 application topically 2 (two) times a week. 120 mL 0  . loratadine (CLARITIN) 10 MG tablet Take 10 mg by mouth daily.    Marland Kitchen LORazepam (ATIVAN) 0.5 MG tablet Take 1 tablet (0.5 mg total) by mouth every 6 (six) hours as needed (Nausea or vomiting). 30 tablet 0  . prochlorperazine (COMPAZINE) 10 MG tablet Take 1 tablet (10 mg total) by mouth every 6 (six) hours as needed (Nausea or vomiting). 30 tablet 1  . acetaminophen-codeine (TYLENOL #3) 300-30 MG per tablet Take 1 tablet by mouth every 8 (eight) hours as needed for moderate pain.   0  . eletriptan (RELPAX) 40 MG tablet Take 40 mg by mouth as needed for migraine or headache. One tablet by mouth at onset of headache. May repeat in 2 hours if headache persists or recurs.    . fluconazole (DIFLUCAN) 100 MG tablet     . lidocaine-prilocaine (EMLA) cream Apply to affected area once (Patient not taking: Reported on 11/13/2014) 30 g 3  . omeprazole (PRILOSEC) 40 MG capsule Take 1 capsule (40 mg total) by mouth daily. 30 capsule 2  . ondansetron (ZOFRAN) 8 MG tablet Take 1 tablet (8 mg total) by mouth 2 (two) times daily. Start the day after chemo for 3 days. Then take as needed for nausea or vomiting. (Patient not taking: Reported on 10/15/2014) 30 tablet 1  . oxyCODONE-acetaminophen (PERCOCET) 10-325 MG per tablet Take 1 tablet by mouth every 6 (six) hours  as needed for pain.  (Patient not taking: Reported on 09/19/2014) 20 tablet 0  . topiramate (TOPAMAX) 50 MG tablet Take 1 tablet (50 mg total) by mouth 2 (two) times daily. For first week start with 1/2 tab twice daily. (Patient not taking: Reported on 10/23/2014) 60 tablet 2   No current facility-administered medications for this visit.    OBJECTIVE: Middle-aged white woman who walks with difficulty Filed Vitals:   11/13/14 1306  BP: 123/78  Pulse: 90  Temp: 97.7 F (36.5 C)  Resp: 19     Body mass index is 41.68 kg/(m^2).    ECOG FS:2 - Symptomatic, <50% confined to bed  Sclerae unicteric, pupils round and equal Oropharynx clear and moist-- no thrush or other lesions No cervical or supraclavicular adenopathy Lungs no rales or rhonchi Heart regular rate and rhythm Abd soft, nontender, positive bowel sounds MSK no focal spinal tenderness, no upper extremity lymphedema Neuro: nonfocal, well oriented, appropriate affect Breasts: deferred  LAB RESULTS:  CMP     Component Value Date/Time   NA 141 11/05/2014 0810   NA 139 09/26/2014 1406   K 4.1 11/05/2014 0810   K 4.0 09/26/2014 1406   CL 105 09/26/2014 1406   CO2 20* 11/05/2014 0810   CO2 24 09/26/2014 1406   GLUCOSE 273* 11/05/2014 0810   GLUCOSE 220* 09/26/2014 1406   BUN 12.3 11/05/2014 0810   BUN 21 09/26/2014 1406   CREATININE 0.8 11/05/2014 0810   CREATININE 0.72 09/26/2014 1406   CALCIUM 9.2 11/05/2014 0810   CALCIUM 8.6 09/26/2014 1406   PROT 6.7 11/05/2014 0810   PROT 6.4 09/26/2014 1406   ALBUMIN 3.7 11/05/2014 0810   ALBUMIN 3.9 09/26/2014 1406   AST 11 11/05/2014 0810   AST 15 09/26/2014 1406   ALT 22 11/05/2014 0810   ALT 13 09/26/2014 1406   ALKPHOS 80 11/05/2014 0810   ALKPHOS 83 09/26/2014 1406   BILITOT 0.43 11/05/2014 0810   BILITOT 0.7 09/26/2014 1406   GFRNONAA >90 09/19/2014 0707   GFRAA >90 09/19/2014 0707    INo results found for: SPEP, UPEP  Lab Results  Component Value Date   WBC 11.8* 11/13/2014    NEUTROABS 7.8* 11/13/2014   HGB 12.1 11/13/2014   HCT 35.9 11/13/2014   MCV 92.3 11/13/2014   PLT 168 11/13/2014      Chemistry      Component Value Date/Time   NA 141 11/05/2014 0810   NA 139 09/26/2014 1406   K 4.1 11/05/2014 0810   K 4.0 09/26/2014 1406   CL 105 09/26/2014 1406   CO2 20* 11/05/2014 0810   CO2 24 09/26/2014 1406   BUN 12.3 11/05/2014 0810   BUN 21 09/26/2014 1406   CREATININE 0.8 11/05/2014 0810   CREATININE 0.72 09/26/2014 1406      Component Value Date/Time   CALCIUM 9.2 11/05/2014 0810   CALCIUM 8.6 09/26/2014 1406   ALKPHOS 80 11/05/2014 0810   ALKPHOS 83 09/26/2014 1406   AST 11 11/05/2014 0810   AST 15 09/26/2014 1406   ALT 22 11/05/2014 0810   ALT 13 09/26/2014 1406   BILITOT 0.43 11/05/2014 0810   BILITOT 0.7 09/26/2014 1406       No results found for: LABCA2  No components found for: LABCA125  No results for input(s): INR in the last 168 hours.  Urinalysis    Component Value Date/Time   COLORURINE AMBER* 02/21/2014 1540   APPEARANCEUR CLOUDY* 02/21/2014 1540   LABSPEC 1.029  02/21/2014 1540   PHURINE 5.0 02/21/2014 1540   GLUCOSEU NEGATIVE 02/21/2014 1540   HGBUR SMALL* 02/21/2014 1540   BILIRUBINUR SMALL* 02/21/2014 1540   KETONESUR 15* 02/21/2014 1540   PROTEINUR NEGATIVE 02/21/2014 1540   UROBILINOGEN 1.0 02/21/2014 1540   NITRITE POSITIVE* 02/21/2014 1540   LEUKOCYTESUR TRACE* 02/21/2014 1540    STUDIES: No results found.  ASSESSMENT: 54 y.o. [place] woman status post right lumpectomy and sentinel lymph node sampling 08/27/2014 for a pT1c pN0, stage IA invasive ductal carcinoma, grade 3, estrogen and progesterone receptor positive, HER-2 negative, with an MIB-1 of 66%  (1) adjuvant chemotherapy will consist of cyclophosphamide and docetaxel every 21 days 4, with onpro support  (2) adjuvant radiation will follow chemotherapy  (3) antiestrogen therapy for 10 years will follow radiation  (4) genetics panel  negative for any mutations in the following genes: APC, ATM, AXIN2, BARD1, BMPR1A, BRCA1, BRCA2, BRIP1, CDH1, CDK4, CDKN2A, CHEK2, EPCAM, FANCC, MLH1, MSH2, MSH6, MUTYH, NBN, PALB2, PMS2, POLD1, POLE, PTEN, RAD51C, RAD51D, SCG5/GREM1, SMAD4, STK11, TP53, VHL, and XRCC2.   (5) hypercoagulable panel normal  (6) a nutrition consult has been requested   PLAN: Teresa Aguilar continues to manage treatments well. The labs were reviewed in detail and were entirely stable.  I am sending in a prescription for omeprazole 47m daily for her heartburn symptoms. For her dry eyes, she prefers to get some OTC drops since she only has 1 cycle of chemo left. She will continue to treat her sinus symptoms symptomatically.   AAnne Ngwill return in 2 weeks for her 4th and final cycle of cyclophosphamide and docetaxel. She understands and agrees with this plan. She knows the goal of treatment in her case is cure. She has been encouraged to call with any issues that might arise before her next visit here.    HLaurie Panda NP   11/13/2014 1:57 PM

## 2014-11-25 NOTE — Progress Notes (Signed)
Teresa Aguilar  [phone] [phone]     ID: Teresa Aguilar DOB: [birth year]  MR#: [id number]  YIR#:[id number]  Patient Care Team: Stephens Shire, MD as PCP - General (Family Medicine) PCP: Stephens Shire, MD GYN: [name] MD SU: [name] MD OTHER MD: [name] MD, Phylliss Bob MD, Jamie Kato MD  CHIEF COMPLAINT: Estrogen receptor positive breast cancer  CURRENT TREATMENT: Adjuvant chemotherapy   BREAST CANCER HISTORY: "Teresa Aguilar" had routine screening mammography January 2016 in Dr. Tonette Bihari office showing a possible change in the right breast. On 07/24/2014 at the breast Center she underwent right digital mammography and ultrasonography. The breast density was category A. In the lower inner quadrant of the right breast there was a 2 cm mass which was palpable by exam. Ultrasound confirmed a 1.5 cm irregular hypoechoic mass at the 3:30 o'clock position 12 cm from the nipple. There was no right axillary adenopathy.  Biopsy of the mass in question 07/24/2014 showed ([id number]) invasive ductal carcinoma, grade 2 or 3, estrogen receptor 98% positive, progesterone receptor 76% positive, both with strong staining intensity, with an MIB-1 of 66% and no HER-2 amplification by FISH.  The patient's case was discussed at the multidisciplinary breast cancer conference 08/08/2014 and it was felt the patient would benefit from breast conserving surgery and likely would need an Oncotype to decide on optimal systemic therapy. She would need radiation and hormones. The question of genetics counseling was also raised.  On 08/27/2014 the patient underwent right lumpectomy and sentinel lymph node sampling. The final pathology ([id number]) confirmed invasive ductal carcinoma, grade 3, measuring 1.9 cm. There was evidence of lymphovascular and perineural invasion. Margins were negative but close, the closest margin for the in situ component being 1 mm.  The single sentinel lymph node was negative. Repeat HER-2 was again negative, with a signals ratio of 0.97 and a number per cell of 1.60.  The patient's subsequent history is as detailed below  INTERVAL HISTORY: Teresa Aguilar returns today for follow up of her breast cancer, accompanied by a friend Lattie Haw Taylor's sister).  Today is day 1, cycle 4 of 4 planned cycles of cyclophosphamide and docetaxel given every 3 weeks, with neulasta on day 2 for granulocyte support.   REVIEW OF SYSTEMS: Teresa Aguilar never has developed fevers from her treatment, though she checks her temperature closely and it has gone up to about 99.8 tops. She never developed mouth sores. She gets nausea the first week and then really bad sense of taste the next week so for those 14 days she really "can't eat". She drinks water and Ensure. After that she does better" I wait comes back". She still has pain from her back surgery, but she is no longer taking narcotics for that. She is able to walk to her mailbox, which is a Psychiatrist piece but by the time she gets back sometimes she is so tired she can barely get up the steps into the house. She was having reflux and that was taken care of with PPIs. However, whenever she eats, she gets a cramp and then a bowel movement. She is having about 2 bowel movements daily. She usually goes from diarrhea to constipation for one of the chemotherapy cycles. She has pain at the base of the nails but very minimal if any numbness over her fingertips, no tingling, and really no neuropathy over the toes at all. Her eyes water sometimes. She has a little bit of a sore throat.  Aside from these issues a detailed review of systems today was stable  PAST MEDICAL HISTORY: Past Medical History  Diagnosis Date  . Hypoxia     after surgery  . DVT (deep venous thrombosis) 03/2014    R leg, post op- on Xarelto   . Hypertension     pt. reports that she has been higher in the past & again now but doesn't take any med.,never  has for ^BP, pt. stating that BP is high now b/c of her pain in her back.    . H/O cardiovascular stress test 2012     test done for stress from her Father dying, had chest pain ,pt. had a stress/echo  test, told it was wnl  . Anxiety     relative for to pain & frustration of prev. hospitalization   . PONV (postoperative nausea and vomiting)     low O2 sats, < 50% post op  . Family history of adverse reaction to anesthesia     PONV  . Gestational diabetes 1986; 1996  . Migraines     "at least q other week" (08/27/2014)  . Arthritis     degenerative lumbar spine    . Chronic back pain   . Breast cancer of lower-inner quadrant of right female breast   . Colon cancer   . Breast cancer 2016  . Family history of breast cancer   . Family history of colon cancer     PAST SURGICAL HISTORY: Past Surgical History  Procedure Laterality Date  . Endometrial ablation  ~ 1998  . Colonoscopy w/ polypectomy  2015    found tubulovillous adenoma with focal high grade displasia  . Plantar fascia release Right ~ 2000  . Eye muscle surgery Bilateral ~ 1965    to fix cross-eye as child  . Sacroiliac joint fusion Right 02/22/2014    Procedure: SACROILIAC JOINT FUSION;  Surgeon: Sinclair Ship, MD;  Location: [facility];  Service: Orthopedics;  Laterality: Right;  Right sided sacroiliac joint fusion  . Radiofrequency ablation nerves      x 2 to nerves in her lower back  . Dental surgery  ~ 2011    "replaced bone graft upper jaw; put 2 implants in"  . Tubal ligation  1996  . Axillary sentinel node biopsy Right 08/27/2014    Archie Endo 08/27/2014  . Tonsillectomy  1988  . Excisional hemorrhoidectomy  2015  . Back surgery    . Breast biopsy Right 07/24/2014    core biopsy  . Breast lumpectomy Right 08/27/2014    Archie Endo 08/27/2014  . Radioactive seed guided mastectomy with axillary sentinel lymph node biopsy Right 08/27/2014    Procedure: RADIOACTIVE SEED GUIDED RIGHT BREAST LUMPECTOMY WITH RIGHT AXILLARY  SENTINEL LYMPH NODE BIOPSY;  Surgeon: [name], MD;  Location: [facility];  Service: General;  Laterality: Right;  . Portacath placement N/A 09/19/2014    Procedure: INSERTION PORT-A-CATH;  Surgeon: [name], MD;  Location: [facility];  Service: General;  Laterality: N/A;    FAMILY HISTORY Family History  Problem Relation Age of Onset  . Breast cancer Cousin 40    maternal first cousin  . Colon cancer Maternal Aunt 75  . Throat cancer Maternal Aunt 75    smoker  . Breast cancer Paternal Aunt 65  . Heart disease Mother   . Heart disease Father   . Diabetes type II Father   . Stomach cancer Maternal Uncle     dx in his 22s  .  Colon cancer Cousin 45     maternal first cousin  . Prostate cancer Cousin 59    paternal first cousin  . Breast cancer Paternal Aunt     dx in her 42s  . Breast cancer Other     mother's paternal first cousin  . Colon cancer Other 35    MGF's sister  . Brain cancer Other 73    MGFs sister  . Breast cancer Other     MGF's paternal aunt  . Prostate cancer Other     MGF's paternal first cousin   the patient's father died at the age of 10 from heart failure in the setting of diabetes. The patient's mother is currently 27 years old. The patient has 3 brothers, 2 sisters. There is no history of breast, ovarian, or colon cancer in first-degree relatives. However, on the mother's side the patient has 3 relatives with breast cancer (a cousin diagnosed age 68, a second cousin diagnosed age 67, and a great aunt). There is also a history of colon cancer diagnosed age around age 54 in a great aunt and small intestinal cancer in an aunt diagnosed age 5. On the father's side there are 2 cousins with breast cancer diagnosed age 25 and 57, and a cousin with colon cancer diagnosed age 15  GYNECOLOGIC HISTORY:  No LMP recorded. Patient has had an ablation. Menarche age 16, the patient carried 2 children to term, the first at age 11. After the first live birth she had a  premature girl who died after one day (she had been a breech birth) and she also had 3 miscarriages. The patient underwent endometrial ablation in 1998 and has had no periods since that time.  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 Her husband Braulio Conte works for [employer] in the [place] and Manufacturing systems engineer. Son Laveta Gilkey lives in [place] and works in Education officer, community. He was an Gaffer) and son Montserrath Madding is 69 and works for [employer]. He also lives in [place]. The patient has one granddaughter. The patient is a Psychologist, forensic    ADVANCED DIRECTIVES: Not in place   HEALTH MAINTENANCE: History  Substance Use Topics  . Smoking status: Never Smoker   . Smokeless tobacco: Never Used  . Alcohol Use: No     Colonoscopy: June 2015/May and last repeat planned 10/03/2014  PAP: 2015  Bone density:  Lipid panel:  Allergies  Allergen Reactions  . Morphine And Related Nausea And Vomiting  . Sulfa Antibiotics Hives    Current Outpatient Prescriptions  Medication Sig Dispense Refill  . dexamethasone (DECADRON) 4 MG tablet Take 2 tablets (8 mg total) by mouth 2 (two) times daily. Start the day before Taxotere. Then again the day after chemo for 3 days. 30 tablet 1  . eletriptan (RELPAX) 40 MG tablet Take 40 mg by mouth as needed for migraine or headache. One tablet by mouth at onset of headache. May repeat in 2 hours if headache persists or recurs.    . fluconazole (DIFLUCAN) 100 MG tablet     . ketoconazole (NIZORAL) 2 % shampoo Apply 1 application topically 2 (two) times a week. 120 mL 0  . lidocaine-prilocaine (EMLA) cream Apply to affected area once (Patient not taking: Reported on 11/13/2014) 30 g 3  . loratadine (CLARITIN) 10 MG tablet Take 10 mg by mouth daily.    Marland Kitchen LORazepam (ATIVAN) 0.5 MG tablet Take 1  tablet (0.5 mg total) by mouth every 6 (six) hours as needed (Nausea or  vomiting). 30 tablet 0  . omeprazole (PRILOSEC) 40 MG capsule Take 1 capsule (40 mg total) by mouth daily. 30 capsule 2  . ondansetron (ZOFRAN) 8 MG tablet Take 1 tablet (8 mg total) by mouth 2 (two) times daily. Start the day after chemo for 3 days. Then take as needed for nausea or vomiting. (Patient not taking: Reported on 10/15/2014) 30 tablet 1  . prochlorperazine (COMPAZINE) 10 MG tablet Take 1 tablet (10 mg total) by mouth every 6 (six) hours as needed (Nausea or vomiting). 30 tablet 1   No current facility-administered medications for this visit.   Facility-Administered Medications Ordered in Other Visits  Medication Dose Route Frequency Provider Last Rate Last Dose  . heparin lock flush 100 unit/mL  500 Units Intravenous Once Chauncey Cruel, MD      . sodium chloride 0.9 % injection 10 mL  10 mL Intravenous PRN Chauncey Cruel, MD   10 mL at 11/26/14 6701    OBJECTIVE: Middle-aged white woman who appears stated age 54 Vitals:   11/26/14 0912  BP: 144/80  Pulse: 109  Temp: 97.5 F (36.4 C)  Resp: 18     Body mass index is 42.59 kg/(m^2).    ECOG FS:2 - Symptomatic, <50% confined to bed (Baseline weight 280.4 pounds, current weight 266.2 pounds)  Sclerae unicteric, pupils round and equal Oropharynx clear and moist-- no thrush or other lesions No cervical or supraclavicular adenopathy Lungs no rales or rhonchi Heart regular rate and rhythm Abd soft, obese, nontender, positive bowel sounds MSK no focal spinal tenderness, no upper extremity lymphedema Neuro: nonfocal, well oriented, appropriate affect Breasts: The right breast is status post lumpectomy. There is no evidence of local recurrence. The right axilla is benign. The left breast is unremarkable   LAB RESULTS:  CMP     Component Value Date/Time   NA 140 11/26/2014 0828   NA 139 09/26/2014 1406   K 3.9 11/26/2014 0828   K 4.0 09/26/2014 1406   CL 105 09/26/2014 1406   CO2 18* 11/26/2014 0828   CO2 24  09/26/2014 1406   GLUCOSE 321* 11/26/2014 0828   GLUCOSE 220* 09/26/2014 1406   BUN 11.7 11/26/2014 0828   BUN 21 09/26/2014 1406   CREATININE 0.8 11/26/2014 0828   CREATININE 0.72 09/26/2014 1406   CALCIUM 9.1 11/26/2014 0828   CALCIUM 8.6 09/26/2014 1406   PROT 6.7 11/26/2014 0828   PROT 6.4 09/26/2014 1406   ALBUMIN 3.6 11/26/2014 0828   ALBUMIN 3.9 09/26/2014 1406   AST 11 11/26/2014 0828   AST 15 09/26/2014 1406   ALT 18 11/26/2014 0828   ALT 13 09/26/2014 1406   ALKPHOS 80 11/26/2014 0828   ALKPHOS 83 09/26/2014 1406   BILITOT 0.53 11/26/2014 0828   BILITOT 0.7 09/26/2014 1406   GFRNONAA >90 09/19/2014 0707   GFRAA >90 09/19/2014 0707    INo results found for: SPEP, UPEP  Lab Results  Component Value Date   WBC 10.8* 11/26/2014   NEUTROABS 9.8* 11/26/2014   HGB 11.9 11/26/2014   HCT 36.3 11/26/2014   MCV 95.8 11/26/2014   PLT 278 11/26/2014      Chemistry      Component Value Date/Time   NA 140 11/26/2014 0828   NA 139 09/26/2014 1406   K 3.9 11/26/2014 0828   K 4.0 09/26/2014 1406   CL 105 09/26/2014 1406  CO2 18* 11/26/2014 0828   CO2 24 09/26/2014 1406   BUN 11.7 11/26/2014 0828   BUN 21 09/26/2014 1406   CREATININE 0.8 11/26/2014 0828   CREATININE 0.72 09/26/2014 1406      Component Value Date/Time   CALCIUM 9.1 11/26/2014 0828   CALCIUM 8.6 09/26/2014 1406   ALKPHOS 80 11/26/2014 0828   ALKPHOS 83 09/26/2014 1406   AST 11 11/26/2014 0828   AST 15 09/26/2014 1406   ALT 18 11/26/2014 0828   ALT 13 09/26/2014 1406   BILITOT 0.53 11/26/2014 0828   BILITOT 0.7 09/26/2014 1406       No results found for: LABCA2  No components found for: LABCA125  No results for input(s): INR in the last 168 hours.  Urinalysis    Component Value Date/Time   COLORURINE AMBER* 02/21/2014 1540   APPEARANCEUR CLOUDY* 02/21/2014 1540   LABSPEC 1.029 02/21/2014 1540   PHURINE 5.0 02/21/2014 1540   GLUCOSEU NEGATIVE 02/21/2014 1540   HGBUR SMALL*  02/21/2014 1540   BILIRUBINUR SMALL* 02/21/2014 1540   KETONESUR 15* 02/21/2014 1540   PROTEINUR NEGATIVE 02/21/2014 1540   UROBILINOGEN 1.0 02/21/2014 1540   NITRITE POSITIVE* 02/21/2014 1540   LEUKOCYTESUR TRACE* 02/21/2014 1540    STUDIES: No results found.  ASSESSMENT: 54 y.o. [place] woman status post right lumpectomy and sentinel lymph node sampling 08/27/2014 for a pT1c pN0, stage IA invasive ductal carcinoma, grade 3, estrogen and progesterone receptor positive, HER-2 negative, with an MIB-1 of 66%  (1) adjuvant chemotherapy with cyclophosphamide and docetaxel every 21 days 4, with onpro support, completed 11/26/2014  (2) adjuvant radiation to follow chemotherapy  (3) antiestrogen therapy for 10 years will follow radiation  (4) genetics panel negative for any mutations in the following genes: APC, ATM, AXIN2, BARD1, BMPR1A, BRCA1, BRCA2, BRIP1, CDH1, CDK4, CDKN2A, CHEK2, EPCAM, FANCC, MLH1, MSH2, MSH6, MUTYH, NBN, PALB2, PMS2, POLD1, POLE, PTEN, RAD51C, RAD51D, SCG5/GREM1, SMAD4, STK11, TP53, VHL, and XRCC2.   (5) R LE DVT documented 03/20/2014 following right sacroiliac joint effusion 02/22/2014  (a) hypercoagulable panel normal  (b) received Rivaroxaban for 6 months, completed April 2016   (6) a nutrition consult has been requested     PLAN: Teresa Aguilar completes her treatments today. She has done a good job of getting through chemotherapy with no delays or dose reductions. Her symptoms are detailed in the review of systems above, but hopefully none are permanent. She is worried because she has heard that Taxotere can cause permanent hair loss, but that has not been our experience here. I look for to seeing her with plenty of strong and curly hair 4-6 months from now.  She really has an appointment with Dr. Sondra Come for early July and she likely will complete her radiation treatments by late August.  Because of her history of DVT we are going to go with anastrozole  when we start anti-estrogens. Accordingly I'm obtaining a DEXA scan before she returns to see me in September.  Because of her high sugars, which I think are going to be due partly to prediabetes and partly to steroids, I have asked her to cut back on her use of steroids as nausea medications and she will take only one tablet twice daily instead of 2 tablets twice daily.   Teresa Aguilar knows to call for any problems that may develop before her next visit here, which will be next week.    Chauncey Cruel, MD   11/26/2014 9:48 AM

## 2014-11-26 ENCOUNTER — Other Ambulatory Visit: Payer: Self-pay | Admitting: Oncology

## 2014-11-26 ENCOUNTER — Ambulatory Visit: Payer: BC Managed Care – PPO

## 2014-11-26 ENCOUNTER — Other Ambulatory Visit: Payer: Self-pay

## 2014-11-26 ENCOUNTER — Telehealth: Payer: Self-pay | Admitting: Oncology

## 2014-11-26 ENCOUNTER — Other Ambulatory Visit: Payer: Self-pay | Admitting: *Deleted

## 2014-11-26 ENCOUNTER — Ambulatory Visit (HOSPITAL_BASED_OUTPATIENT_CLINIC_OR_DEPARTMENT_OTHER): Payer: BC Managed Care – PPO

## 2014-11-26 ENCOUNTER — Other Ambulatory Visit (HOSPITAL_BASED_OUTPATIENT_CLINIC_OR_DEPARTMENT_OTHER): Payer: BC Managed Care – PPO

## 2014-11-26 ENCOUNTER — Encounter: Payer: Self-pay | Admitting: *Deleted

## 2014-11-26 ENCOUNTER — Ambulatory Visit (HOSPITAL_BASED_OUTPATIENT_CLINIC_OR_DEPARTMENT_OTHER): Payer: BC Managed Care – PPO | Admitting: Oncology

## 2014-11-26 VITALS — HR 96

## 2014-11-26 VITALS — BP 144/80 | HR 109 | Temp 97.5°F | Resp 18 | Ht 67.0 in | Wt 272.0 lb

## 2014-11-26 DIAGNOSIS — C50311 Malignant neoplasm of lower-inner quadrant of right female breast: Secondary | ICD-10-CM

## 2014-11-26 DIAGNOSIS — Z86718 Personal history of other venous thrombosis and embolism: Secondary | ICD-10-CM

## 2014-11-26 DIAGNOSIS — Z5189 Encounter for other specified aftercare: Secondary | ICD-10-CM | POA: Diagnosis not present

## 2014-11-26 DIAGNOSIS — Z6841 Body Mass Index (BMI) 40.0 and over, adult: Secondary | ICD-10-CM

## 2014-11-26 DIAGNOSIS — M532X8 Spinal instabilities, sacral and sacrococcygeal region: Secondary | ICD-10-CM

## 2014-11-26 DIAGNOSIS — I82491 Acute embolism and thrombosis of other specified deep vein of right lower extremity: Secondary | ICD-10-CM

## 2014-11-26 DIAGNOSIS — Z5111 Encounter for antineoplastic chemotherapy: Secondary | ICD-10-CM | POA: Diagnosis not present

## 2014-11-26 DIAGNOSIS — R739 Hyperglycemia, unspecified: Secondary | ICD-10-CM

## 2014-11-26 DIAGNOSIS — Z79811 Long term (current) use of aromatase inhibitors: Secondary | ICD-10-CM

## 2014-11-26 DIAGNOSIS — Z95828 Presence of other vascular implants and grafts: Secondary | ICD-10-CM

## 2014-11-26 DIAGNOSIS — G43109 Migraine with aura, not intractable, without status migrainosus: Secondary | ICD-10-CM

## 2014-11-26 DIAGNOSIS — C50911 Malignant neoplasm of unspecified site of right female breast: Secondary | ICD-10-CM

## 2014-11-26 DIAGNOSIS — M8580 Other specified disorders of bone density and structure, unspecified site: Secondary | ICD-10-CM

## 2014-11-26 LAB — COMPREHENSIVE METABOLIC PANEL (CC13)
ALK PHOS: 80 U/L (ref 40–150)
ALT: 18 U/L (ref 0–55)
ANION GAP: 14 meq/L — AB (ref 3–11)
AST: 11 U/L (ref 5–34)
Albumin: 3.6 g/dL (ref 3.5–5.0)
BILIRUBIN TOTAL: 0.53 mg/dL (ref 0.20–1.20)
BUN: 11.7 mg/dL (ref 7.0–26.0)
CO2: 18 meq/L — AB (ref 22–29)
CREATININE: 0.8 mg/dL (ref 0.6–1.1)
Calcium: 9.1 mg/dL (ref 8.4–10.4)
Chloride: 108 mEq/L (ref 98–109)
EGFR: 85 mL/min/{1.73_m2} — ABNORMAL LOW (ref >90)
Glucose: 321 mg/dl — ABNORMAL HIGH (ref 70–140)
Potassium: 3.9 mEq/L (ref 3.5–5.1)
SODIUM: 140 meq/L (ref 136–145)
TOTAL PROTEIN: 6.7 g/dL (ref 6.4–8.3)

## 2014-11-26 LAB — CBC WITH DIFFERENTIAL/PLATELET
BASO%: 0.1 % (ref 0.0–2.0)
BASOS ABS: 0 10*3/uL (ref 0.0–0.1)
EOS ABS: 0 10*3/uL (ref 0.0–0.5)
EOS%: 0 % (ref 0.0–7.0)
HEMATOCRIT: 36.3 % (ref 34.8–46.6)
HEMOGLOBIN: 11.9 g/dL (ref 11.6–15.9)
LYMPH%: 5.6 % — ABNORMAL LOW (ref 14.0–49.7)
MCH: 31.4 pg (ref 25.1–34.0)
MCHC: 32.8 g/dL (ref 31.5–36.0)
MCV: 95.8 fL (ref 79.5–101.0)
MONO#: 0.4 10*3/uL (ref 0.1–0.9)
MONO%: 3.2 % (ref 0.0–14.0)
NEUT%: 91.1 % — AB (ref 38.4–76.8)
NEUTROS ABS: 9.8 10*3/uL — AB (ref 1.5–6.5)
Platelets: 278 10*3/uL (ref 145–400)
RBC: 3.79 10*6/uL (ref 3.70–5.45)
RDW: 16.7 % — AB (ref 11.2–14.5)
WBC: 10.8 10*3/uL — ABNORMAL HIGH (ref 3.9–10.3)
lymph#: 0.6 10*3/uL — ABNORMAL LOW (ref 0.9–3.3)

## 2014-11-26 MED ORDER — DEXTROSE 5 % IV SOLN
75.0000 mg/m2 | Freq: Once | INTRAVENOUS | Status: AC
  Administered 2014-11-26: 180 mg via INTRAVENOUS
  Filled 2014-11-26: qty 18

## 2014-11-26 MED ORDER — HEPARIN SOD (PORK) LOCK FLUSH 100 UNIT/ML IV SOLN
500.0000 [IU] | Freq: Once | INTRAVENOUS | Status: DC
  Filled 2014-11-26: qty 5

## 2014-11-26 MED ORDER — SODIUM CHLORIDE 0.9 % IJ SOLN
10.0000 mL | INTRAMUSCULAR | Status: DC | PRN
  Administered 2014-11-26: 10 mL
  Filled 2014-11-26: qty 10

## 2014-11-26 MED ORDER — HEPARIN SOD (PORK) LOCK FLUSH 100 UNIT/ML IV SOLN
500.0000 [IU] | Freq: Once | INTRAVENOUS | Status: AC | PRN
  Administered 2014-11-26: 500 [IU]
  Filled 2014-11-26: qty 5

## 2014-11-26 MED ORDER — SODIUM CHLORIDE 0.9 % IV SOLN
600.0000 mg/m2 | Freq: Once | INTRAVENOUS | Status: AC
  Administered 2014-11-26: 1480 mg via INTRAVENOUS
  Filled 2014-11-26: qty 74

## 2014-11-26 MED ORDER — PEGFILGRASTIM 6 MG/0.6ML ~~LOC~~ PSKT
6.0000 mg | PREFILLED_SYRINGE | Freq: Once | SUBCUTANEOUS | Status: AC
  Administered 2014-11-26: 6 mg via SUBCUTANEOUS
  Filled 2014-11-26: qty 0.6

## 2014-11-26 MED ORDER — SODIUM CHLORIDE 0.9 % IJ SOLN
10.0000 mL | INTRAMUSCULAR | Status: DC | PRN
  Administered 2014-11-26: 10 mL via INTRAVENOUS
  Filled 2014-11-26: qty 10

## 2014-11-26 MED ORDER — SODIUM CHLORIDE 0.9 % IV SOLN
Freq: Once | INTRAVENOUS | Status: AC
  Administered 2014-11-26: 11:00:00 via INTRAVENOUS
  Filled 2014-11-26: qty 8

## 2014-11-26 MED ORDER — SODIUM CHLORIDE 0.9 % IV SOLN
Freq: Once | INTRAVENOUS | Status: AC
  Administered 2014-11-26: 11:00:00 via INTRAVENOUS

## 2014-11-26 NOTE — Patient Instructions (Signed)
[facility] Discharge Instructions for Patients Receiving Chemotherapy  Today you received the following chemotherapy agents:  Taxotere and Cytoxan  To help prevent nausea and vomiting after your treatment, we encourage you to take your nausea medication as ordered per MD.  If you develop nausea and vomiting that is not controlled by your nausea medication, call the clinic.   BELOW ARE SYMPTOMS THAT SHOULD BE REPORTED IMMEDIATELY:  *FEVER GREATER THAN 100.5 F  *CHILLS WITH OR WITHOUT FEVER  NAUSEA AND VOMITING THAT IS NOT CONTROLLED WITH YOUR NAUSEA MEDICATION  *UNUSUAL SHORTNESS OF BREATH  *UNUSUAL BRUISING OR BLEEDING  TENDERNESS IN MOUTH AND THROAT WITH OR WITHOUT PRESENCE OF ULCERS  *URINARY PROBLEMS  *BOWEL PROBLEMS  UNUSUAL RASH Items with * indicate a potential emergency and should be followed up as soon as possible.  Feel free to call the clinic you have any questions or concerns. The clinic phone number is [phone].  Please show the [facility] at check-in to the Emergency Department and triage nurse.

## 2014-11-26 NOTE — Progress Notes (Signed)
Oncology Nurse Navigator Documentation  Oncology Nurse Navigator Flowsheets 11/26/2014  Navigator Encounter Type Treatment  Patient Visit Type Medonc  Treatment Phase Final Chemo TX  Time Spent with Patient 30   Met with pt during final chemo treatment and completion of treatment.  Relate doing well and without complaints. Discussed xrt and gave instructions/directions. Denies further needs at this time. Encourage pt to call with questions or concerns. Received verbal understanding.

## 2014-11-26 NOTE — Telephone Encounter (Signed)
Gave avs & calendar for June thru September.

## 2014-11-26 NOTE — Patient Instructions (Signed)

## 2014-11-28 ENCOUNTER — Ambulatory Visit: Payer: Self-pay

## 2014-12-04 ENCOUNTER — Ambulatory Visit (HOSPITAL_BASED_OUTPATIENT_CLINIC_OR_DEPARTMENT_OTHER): Payer: BC Managed Care – PPO

## 2014-12-04 ENCOUNTER — Other Ambulatory Visit (HOSPITAL_BASED_OUTPATIENT_CLINIC_OR_DEPARTMENT_OTHER): Payer: BC Managed Care – PPO

## 2014-12-04 ENCOUNTER — Encounter: Payer: Self-pay | Admitting: Nurse Practitioner

## 2014-12-04 ENCOUNTER — Other Ambulatory Visit: Payer: Self-pay

## 2014-12-04 ENCOUNTER — Ambulatory Visit (HOSPITAL_BASED_OUTPATIENT_CLINIC_OR_DEPARTMENT_OTHER): Payer: BC Managed Care – PPO | Admitting: Nurse Practitioner

## 2014-12-04 VITALS — BP 116/78 | HR 105 | Resp 20 | Wt 267.3 lb

## 2014-12-04 DIAGNOSIS — C50311 Malignant neoplasm of lower-inner quadrant of right female breast: Secondary | ICD-10-CM

## 2014-12-04 DIAGNOSIS — I82491 Acute embolism and thrombosis of other specified deep vein of right lower extremity: Secondary | ICD-10-CM

## 2014-12-04 DIAGNOSIS — M532X8 Spinal instabilities, sacral and sacrococcygeal region: Secondary | ICD-10-CM

## 2014-12-04 DIAGNOSIS — Z95828 Presence of other vascular implants and grafts: Secondary | ICD-10-CM

## 2014-12-04 DIAGNOSIS — Z6841 Body Mass Index (BMI) 40.0 and over, adult: Secondary | ICD-10-CM

## 2014-12-04 DIAGNOSIS — G43109 Migraine with aura, not intractable, without status migrainosus: Secondary | ICD-10-CM

## 2014-12-04 LAB — CBC WITH DIFFERENTIAL/PLATELET
BASO%: 1.2 % (ref 0.0–2.0)
BASOS ABS: 0.1 10*3/uL (ref 0.0–0.1)
EOS ABS: 0 10*3/uL (ref 0.0–0.5)
EOS%: 0.1 % (ref 0.0–7.0)
HEMATOCRIT: 34.8 % (ref 34.8–46.6)
HGB: 11.6 g/dL (ref 11.6–15.9)
LYMPH%: 13.7 % — ABNORMAL LOW (ref 14.0–49.7)
MCH: 31.4 pg (ref 25.1–34.0)
MCHC: 33.3 g/dL (ref 31.5–36.0)
MCV: 94.3 fL (ref 79.5–101.0)
MONO#: 1.8 10*3/uL — ABNORMAL HIGH (ref 0.1–0.9)
MONO%: 19.1 % — AB (ref 0.0–14.0)
NEUT#: 6.1 10*3/uL (ref 1.5–6.5)
NEUT%: 65.9 % (ref 38.4–76.8)
NRBC: 1 % — AB (ref 0–0)
Platelets: 123 10*3/uL — ABNORMAL LOW (ref 145–400)
RBC: 3.69 10*6/uL — ABNORMAL LOW (ref 3.70–5.45)
RDW: 16.5 % — AB (ref 11.2–14.5)
WBC: 9.3 10*3/uL (ref 3.9–10.3)
lymph#: 1.3 10*3/uL (ref 0.9–3.3)

## 2014-12-04 MED ORDER — HEPARIN SOD (PORK) LOCK FLUSH 100 UNIT/ML IV SOLN
500.0000 [IU] | Freq: Once | INTRAVENOUS | Status: AC
  Administered 2014-12-04: 500 [IU] via INTRAVENOUS
  Filled 2014-12-04: qty 5

## 2014-12-04 MED ORDER — SODIUM CHLORIDE 0.9 % IJ SOLN
10.0000 mL | INTRAMUSCULAR | Status: DC | PRN
  Administered 2014-12-04: 10 mL via INTRAVENOUS
  Filled 2014-12-04: qty 10

## 2014-12-04 NOTE — Progress Notes (Signed)
[place]  [phone] [phone]     ID: Teresa Aguilar DOB: 04-05-1961  MR#: [id number]  ZYS#:[id number]  Patient Care Team: Stephens Shire, MD as PCP - General (Family Medicine) PCP: Stephens Shire, MD GYN: [name] MD SU: [name] MD OTHER MD: [name] MD, Phylliss Bob MD, Jamie Kato MD  CHIEF COMPLAINT: Estrogen receptor positive breast cancer  CURRENT TREATMENT: Adjuvant chemotherapy   BREAST CANCER HISTORY: "Teresa Aguilar" had routine screening mammography January 2016 in Dr. Tonette Bihari office showing a possible change in the right breast. On 07/24/2014 at the breast Center she underwent right digital mammography and ultrasonography. The breast density was category A. In the lower inner quadrant of the right breast there was a 2 cm mass which was palpable by exam. Ultrasound confirmed a 1.5 cm irregular hypoechoic mass at the 3:30 o'clock position 12 cm from the nipple. There was no right axillary adenopathy.  Biopsy of the mass in question 07/24/2014 showed ([id number]) invasive ductal carcinoma, grade 2 or 3, estrogen receptor 98% positive, progesterone receptor 76% positive, both with strong staining intensity, with an MIB-1 of 66% and no HER-2 amplification by FISH.  The patient's case was discussed at the multidisciplinary breast cancer conference 08/08/2014 and it was felt the patient would benefit from breast conserving surgery and likely would need an Oncotype to decide on optimal systemic therapy. She would need radiation and hormones. The question of genetics counseling was also raised.  On 08/27/2014 the patient underwent right lumpectomy and sentinel lymph node sampling. The final pathology ([id number]) confirmed invasive ductal carcinoma, grade 3, measuring 1.9 cm. There was evidence of lymphovascular and perineural invasion. Margins were negative but close, the closest margin for the in situ component being 1 mm.  The single sentinel lymph node was negative. Repeat HER-2 was again negative, with a signals ratio of 0.97 and a number per cell of 1.60.  The patient's subsequent history is as detailed below  INTERVAL HISTORY: Teresa Aguilar returns today for follow up of her breast cancer, accompanied by a friend Lattie Haw Taylor's sister).  Today is day 8, cycle 4 of 4 planned cycles of cyclophosphamide and docetaxel given every 3 weeks, with neulasta on day 2 for granulocyte support.   REVIEW OF SYSTEMS: Teresa Aguilar denies fevers or chills. She has mild nausea but this is managed well with compazine. Her appetite has been down secondary to tastes changes and abdominal discomfort. She is moving her bowels well with loose stools on occasion. She dines mouth sores or rashes, she has minimal if any neuropathy symptoms. She had some bone pain with neulasta. Her hip is hurting especially, but she has chronic issues on this right side. A detailed review of systems is otherwise stable.  PAST MEDICAL HISTORY: Past Medical History  Diagnosis Date  . Hypoxia     after surgery  . DVT (deep venous thrombosis) 03/2014    R leg, post op- on Xarelto   . Hypertension     pt. reports that she has been higher in the past & again now but doesn't take any med.,never has for ^BP, pt. stating that BP is high now b/c of her pain in her back.    . H/O cardiovascular stress test 2012     test done for stress from her Father dying, had chest pain ,pt. had a stress/echo  test, told it was wnl  . Anxiety     relative for to pain & frustration of  prev. hospitalization   . PONV (postoperative nausea and vomiting)     low O2 sats, < 50% post op  . Family history of adverse reaction to anesthesia     PONV  . Gestational diabetes 1986; 1996  . Migraines     "at least q other week" (08/27/2014)  . Arthritis     degenerative lumbar spine    . Chronic back pain   . Breast cancer of lower-inner quadrant of right female breast   . Colon cancer   .  Breast cancer 2016  . Family history of breast cancer   . Family history of colon cancer     PAST SURGICAL HISTORY: Past Surgical History  Procedure Laterality Date  . Endometrial ablation  ~ 1998  . Colonoscopy w/ polypectomy  2015    found tubulovillous adenoma with focal high grade displasia  . Plantar fascia release Right ~ 2000  . Eye muscle surgery Bilateral ~ 1965    to fix cross-eye as child  . Sacroiliac joint fusion Right 02/22/2014    Procedure: SACROILIAC JOINT FUSION;  Surgeon: Sinclair Ship, MD;  Location: [facility];  Service: Orthopedics;  Laterality: Right;  Right sided sacroiliac joint fusion  . Radiofrequency ablation nerves      x 2 to nerves in her lower back  . Dental surgery  ~ 2011    "replaced bone graft upper jaw; put 2 implants in"  . Tubal ligation  1996  . Axillary sentinel node biopsy Right 08/27/2014    [facility] Endo 08/27/2014  . Tonsillectomy  1988  . Excisional hemorrhoidectomy  2015  . Back surgery    . Breast biopsy Right 07/24/2014    core biopsy  . Breast lumpectomy Right 08/27/2014    Ar[facility] Endo 08/27/2014  . Radioactive seed guided mastectomy with axillary sentinel lymph node biopsy Right 08/27/2014    Procedure: RADIOACTIVE SEED GUIDED RIGHT BREAST LUMPECTOMY WITH RIGHT AXILLARY SENTINEL LYMPH NODE BIOPSY;  Surgeon: [name], MD;  Location: [facility];  Service: General;  Laterality: Right;  . Portacath placement N/A 09/19/2014    Procedure: INSERTION PORT-A-CATH;  Surgeon: [name], MD;  [facility];  Service: General;  Laterality: N/A;    FAMILY HISTORY Family History  Problem Relation Age of Onset  . Breast cancer Cousin 75    maternal first cousin  . Colon cancer Maternal Aunt 75  . Throat cancer Maternal Aunt 75    smoker  . Breast cancer Paternal Aunt 27  . Heart disease Mother   . Heart disease Father   . Diabetes type II Father   . Stomach cancer Maternal Uncle     dx in his 75s  . Colon cancer Cousin 19     maternal  first cousin  . Prostate cancer Cousin 8    paternal first cousin  . Breast cancer Paternal Aunt     dx in her 30s  . Breast cancer Other     mother's paternal first cousin  . Colon cancer Other 23    MGF's sister  . Brain cancer Other 53    MGFs sister  . Breast cancer Other     MGF's paternal aunt  . Prostate cancer Other     MGF's paternal first cousin   the patient's father died at the age of 35 from heart failure in the setting of diabetes. The patient's mother is currently 53 years old. The patient has 3 brothers, 2 sisters. There is no history of breast,  ovarian, or colon cancer in first-degree relatives. However, on the mother's side the patient has 3 relatives with breast cancer (a cousin diagnosed age 32, a second cousin diagnosed age 68, and a great aunt). There is also a history of colon cancer diagnosed age around age 25 in a great aunt and small intestinal cancer in an aunt diagnosed age 41. On the father's side there are 2 cousins with breast cancer diagnosed age 36 and 62, and a cousin with colon cancer diagnosed age 41  GYNECOLOGIC HISTORY:  No LMP recorded. Patient has had an ablation. Menarche age 34, the patient carried 2 children to term, the first at age 1. After the first live birth she had a premature girl who died after one day (she had been a breech birth) and she also had 3 miscarriages. The patient underwent endometrial ablation in 1998 and has had no periods since that time.  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 Her husband Braulio Conte works for [employer] in the [place] and Manufacturing systems engineer. Son Quantina Dershem lives in [place] and works in Education officer, community. He was an Gaffer) and son Nova Schmuhl is 22 and works for [employer]. He also lives in [place]. The patient has one granddaughter. The patient is a Psychologist, forensic    ADVANCED DIRECTIVES: Not in  place   HEALTH MAINTENANCE: History  Substance Use Topics  . Smoking status: Never Smoker   . Smokeless tobacco: Never Used  . Alcohol Use: No     Colonoscopy: June 2015/May and last repeat planned 10/03/2014  PAP: 2015  Bone density:  Lipid panel:  Allergies  Allergen Reactions  . Morphine And Related Nausea And Vomiting  . Sulfa Antibiotics Hives    Current Outpatient Prescriptions  Medication Sig Dispense Refill  . ketoconazole (NIZORAL) 2 % shampoo Apply 1 application topically 2 (two) times a week. 120 mL 0  . loratadine (CLARITIN) 10 MG tablet Take 10 mg by mouth daily.    Marland Kitchen omeprazole (PRILOSEC) 40 MG capsule Take 1 capsule (40 mg total) by mouth daily. 30 capsule 2  . eletriptan (RELPAX) 40 MG tablet Take 40 mg by mouth as needed for migraine or headache. One tablet by mouth at onset of headache. May repeat in 2 hours if headache persists or recurs.     No current facility-administered medications for this visit.    OBJECTIVE: Middle-aged white woman who appears stated age 54 Vitals:   12/04/14 1322  BP: 116/78  Pulse: 105  Resp: 20     Body mass index is 41.86 kg/(m^2).    ECOG FS:2 - Symptomatic, <50% confined to bed (Baseline weight 280.4 pounds, current weight 266.2 pounds)  Skin: warm, dry  HEENT: sclerae anicteric, conjunctivae pink, oropharynx clear. No thrush or mucositis.  Lymph Nodes: No cervical or supraclavicular lymphadenopathy  Lungs: clear to auscultation bilaterally, no rales, wheezes, or rhonci  Heart: regular rate and rhythm  Abdomen: round, soft, non tender, positive bowel sounds  Musculoskeletal: No focal spinal tenderness, no peripheral edema  Neuro: non focal, well oriented, positive affect  Breasts: deferred   LAB RESULTS:  CMP     Component Value Date/Time   NA 140 11/26/2014 0828   NA 139 09/26/2014 1406   K 3.9 11/26/2014 0828   K 4.0 09/26/2014 1406   CL 105 09/26/2014 1406   CO2 18* 11/26/2014 0828   CO2 24  09/26/2014 1406  GLUCOSE 321* 11/26/2014 0828   GLUCOSE 220* 09/26/2014 1406   BUN 11.7 11/26/2014 0828   BUN 21 09/26/2014 1406   CREATININE 0.8 11/26/2014 0828   CREATININE 0.72 09/26/2014 1406   CALCIUM 9.1 11/26/2014 0828   CALCIUM 8.6 09/26/2014 1406   PROT 6.7 11/26/2014 0828   PROT 6.4 09/26/2014 1406   ALBUMIN 3.6 11/26/2014 0828   ALBUMIN 3.9 09/26/2014 1406   AST 11 11/26/2014 0828   AST 15 09/26/2014 1406   ALT 18 11/26/2014 0828   ALT 13 09/26/2014 1406   ALKPHOS 80 11/26/2014 0828   ALKPHOS 83 09/26/2014 1406   BILITOT 0.53 11/26/2014 0828   BILITOT 0.7 09/26/2014 1406   GFRNONAA >90 09/19/2014 0707   GFRAA >90 09/19/2014 0707    INo results found for: SPEP, UPEP  Lab Results  Component Value Date   WBC 9.3 12/04/2014   NEUTROABS 6.1 12/04/2014   HGB 11.6 12/04/2014   HCT 34.8 12/04/2014   MCV 94.3 12/04/2014   PLT 123* 12/04/2014      Chemistry      Component Value Date/Time   NA 140 11/26/2014 0828   NA 139 09/26/2014 1406   K 3.9 11/26/2014 0828   K 4.0 09/26/2014 1406   CL 105 09/26/2014 1406   CO2 18* 11/26/2014 0828   CO2 24 09/26/2014 1406   BUN 11.7 11/26/2014 0828   BUN 21 09/26/2014 1406   CREATININE 0.8 11/26/2014 0828   CREATININE 0.72 09/26/2014 1406      Component Value Date/Time   CALCIUM 9.1 11/26/2014 0828   CALCIUM 8.6 09/26/2014 1406   ALKPHOS 80 11/26/2014 0828   ALKPHOS 83 09/26/2014 1406   AST 11 11/26/2014 0828   AST 15 09/26/2014 1406   ALT 18 11/26/2014 0828   ALT 13 09/26/2014 1406   BILITOT 0.53 11/26/2014 0828   BILITOT 0.7 09/26/2014 1406       No results found for: LABCA2  No components found for: LABCA125  No results for input(s): INR in the last 168 hours.  Urinalysis    Component Value Date/Time   COLORURINE AMBER* 02/21/2014 1540   APPEARANCEUR CLOUDY* 02/21/2014 1540   LABSPEC 1.029 02/21/2014 1540   PHURINE 5.0 02/21/2014 1540   GLUCOSEU NEGATIVE 02/21/2014 1540   HGBUR SMALL* 02/21/2014  1540   BILIRUBINUR SMALL* 02/21/2014 1540   KETONESUR 15* 02/21/2014 1540   PROTEINUR NEGATIVE 02/21/2014 1540   UROBILINOGEN 1.0 02/21/2014 1540   NITRITE POSITIVE* 02/21/2014 1540   LEUKOCYTESUR TRACE* 02/21/2014 1540    STUDIES: No results found.  ASSESSMENT: 54 y.o. [place] woman status post right lumpectomy and sentinel lymph node sampling 08/27/2014 for a pT1c pN0, stage IA invasive ductal carcinoma, grade 3, estrogen and progesterone receptor positive, HER-2 negative, with an MIB-1 of 66%  (1) adjuvant chemotherapy with cyclophosphamide and docetaxel every 21 days 4, with onpro support, completed 11/26/2014  (2) adjuvant radiation to follow chemotherapy  (3) antiestrogen therapy for 10 years will follow radiation  (4) genetics panel negative for any mutations in the following genes: APC, ATM, AXIN2, BARD1, BMPR1A, BRCA1, BRCA2, BRIP1, CDH1, CDK4, CDKN2A, CHEK2, EPCAM, FANCC, MLH1, MSH2, MSH6, MUTYH, NBN, PALB2, PMS2, POLD1, POLE, PTEN, RAD51C, RAD51D, SCG5/GREM1, SMAD4, STK11, TP53, VHL, and XRCC2.   (5) R LE DVT documented 03/20/2014 following right sacroiliac joint effusion 02/22/2014  (a) hypercoagulable panel normal  (b) received Rivaroxaban for 6 months, completed April 2016   (6) a nutrition consult has been requested  PLAN: Teresa Aguilar looks and feels well today. The labs were reviewed in detail, and besides an elevated glucose, the results were stable. Of course now that she has completed chemo we will no longer be treating her with steroids, which is causing the spike in her glucose.   She would like her port out as soon as possible, and I have referred her back to Dr. Cristal Generous office to accommodate this.   She will begin radiation soon, and meet with Dr. Jana Hakim in September to discuss antiestrogen therapy. She understands and agrees with this plan. She knows the goal of treatment in her case is cure. Has been encouraged to call with any issues that  might arise before her next visit here.  Laurie Panda, NP   12/04/2014 1:51 PM

## 2014-12-04 NOTE — Patient Instructions (Signed)

## 2014-12-13 NOTE — Progress Notes (Addendum)
Location of Breast Cancer: right breast lower inner quadrant  Histology per Pathology Report:   08/27/14 Diagnosis 1. Breast, lumpectomy, Right - INVASIVE DUCTAL CARCINOMA, SEE COMMENT. - POSITIVE FOR LYMPH VASCULAR INVASION. - POSITIVE FOR PERINEURAL INVASION. - INVASIVE TUMOR IS 1 MM FROM THE NEAREST MARGIN (LATERAL). - DUCTAL CARCINOMA IN SITU. - PREVIOUS BIOPSY SITE. - SEE TUMOR SYNOPTIC TEMPLATE BELOW. 2. Breast, excision, Right posterior margin - BENIGN BREAST TISSUE, SEE COMMENT. - NEGATIVE FOR ATYPIA OR MALIGNANCY.  07/24/14 Diagnosis Breast, right, needle core biopsy, mass, LIQ, 3:30 12 cmfn - INVASIVE DUCTAL CARCINOMA. - LYMPHOVASCULAR INVASION IS IDENTIFIED. - SEE COMMENT.  Receptor Status: ER(98%+), PR (76%+), Her2-neu (negative)  Did patient present with symptoms (if so, please note symptoms) or was this found on screening mammography?: screening mammography  Past/Anticipated interventions by surgeon, if any: 08/27/14 - Procedure: RADIOACTIVE SEED GUIDED RIGHT BREAST LUMPECTOMY WITH RIGHT AXILLARY SENTINEL LYMPH NODE BIOPSY; Surgeon: [name], MD; Location: [facility]; Service: General; Laterality: Right;  Past/Anticipated interventions by medical oncology, if JFH:[id number] chemotherapy with cyclophosphamide and docetaxel every 21 days 4, with onpro support, completed 11/26/2014. Anastrazole after radiation.   Lymphedema issues, if any: no  Pain issues, if any: has lower back pain from an injury at work. Has some discomfort with movement of her right arm.  OB GYN history: Age at menarche: 79 years, menopause 38-55 years, used bcp for 1 year, gravida 5, para 2, maternal age 29-25  SAFETY ISSUES:  Prior radiation? no  Pacemaker/ICD? no  Possible current pregnancy?no  Is the patient on methotrexate? no  Current Complaints / other details: Patient is having problems with her right chest port a cath.  She said it has blood clots in it and needs to be  removed.  It is also irritating her right neck.  She is wondering when she should have it removed.  She is here with her mother.  BP 116/72 mmHg  Pulse 100  Temp(Src) 97.9 F (36.6 C) (Oral)  Resp 24  Ht 5' 7" (1.702 m)  Wt 273 lb 4.8 oz (123.968 kg)  BMI 42.79 kg/m2

## 2014-12-19 ENCOUNTER — Ambulatory Visit
Admission: RE | Admit: 2014-12-19 | Discharge: 2014-12-19 | Disposition: A | Discharge status: Home / Self Care | Payer: BC Managed Care – PPO | Source: Ambulatory Visit | Attending: Radiation Oncology | Admitting: Radiation Oncology

## 2014-12-19 ENCOUNTER — Encounter: Payer: Self-pay | Admitting: Radiation Oncology

## 2014-12-19 ENCOUNTER — Telehealth: Payer: Self-pay | Admitting: *Deleted

## 2014-12-19 VITALS — BP 116/72 | HR 100 | Temp 97.9°F | Resp 24 | Ht 67.0 in | Wt 273.3 lb

## 2014-12-19 DIAGNOSIS — Z803 Family history of malignant neoplasm of breast: Secondary | ICD-10-CM | POA: Insufficient documentation

## 2014-12-19 DIAGNOSIS — C50311 Malignant neoplasm of lower-inner quadrant of right female breast: Secondary | ICD-10-CM

## 2014-12-19 DIAGNOSIS — Z9889 Other specified postprocedural states: Secondary | ICD-10-CM | POA: Insufficient documentation

## 2014-12-19 DIAGNOSIS — Z170 Estrogen receptor positive status [ER+]: Secondary | ICD-10-CM | POA: Diagnosis not present

## 2014-12-19 DIAGNOSIS — G43909 Migraine, unspecified, not intractable, without status migrainosus: Secondary | ICD-10-CM | POA: Diagnosis not present

## 2014-12-19 NOTE — Progress Notes (Signed)
Radiation Oncology         [phone] ________________________________  Name: Teresa Aguilar MRN: [id number]  Date: 12/19/2014  DOB: February 03, 1961  Revaluation Note  CC: Stephens Shire, MD  Magrinat, [name], MD    ICD-9-CM ICD-10-CM   1. Carcinoma of lower-inner quadrant of right breast 174.3 C50.311     Diagnosis:   pT1c pN0, stage IA invasive ductal carcinoma of the right breast, grade 3, estrogen and progesterone receptor positive   Narrative:  This patient has been seen as a part of the interdisciplinary breast clinic.  It has been 3 weeks since her last infusion of neoadjuvant chemotherapy. Patient is having problems with her right chest port a cath.  She is concerned it may have blood clots in it and needs to be removed.  It is also irritating her right neck.  Patient will refer to Dr.Wakefield regarding removal of her port-a-cath  She is here with her mother.  Patient denies any pain or swelling in her right breast or right arm.   ALLERGIES:  is allergic to morphine and related and sulfa antibiotics.  Meds: Current Outpatient Prescriptions  Medication Sig Dispense Refill  . eletriptan (RELPAX) 40 MG tablet Take 40 mg by mouth as needed for migraine or headache. One tablet by mouth at onset of headache. May repeat in 2 hours if headache persists or recurs.    Marland Kitchen ketoconazole (NIZORAL) 2 % shampoo Apply 1 application topically 2 (two) times a week. (Patient not taking: Reported on 12/19/2014) 120 mL 0  . loratadine (CLARITIN) 10 MG tablet Take 10 mg by mouth daily.    Marland Kitchen omeprazole (PRILOSEC) 40 MG capsule Take 1 capsule (40 mg total) by mouth daily. (Patient not taking: Reported on 12/19/2014) 30 capsule 2   No current facility-administered medications for this encounter.    Physical Findings: The patient is in no acute distress. Patient is alert and oriented.  height is 5\' 7"  (1.702 m) and weight is 273 lb 4.8 oz (123.968 kg). Her oral temperature is 97.9 F (36.6 C).  Her blood pressure is 116/72 and her pulse is 100. Her respiration is 24. .  No significant changes. Left breast: no palpable mass or nipple bleeding/discharge Right breast: Lower inner quadrant lumpectomy scar that is healing well with no palpable sign of recurrence, separate axillary scar that has healed well No palpable cervical, supraclavicular or axillary lymphoadenopathy General: Well-developed, in no acute distress HEENT: Normocephalic, atraumatic Cardiovascular: Regular rate and rhythm Respiratory: Clear to auscultation bilaterally   Lab Findings: Lab Results  Component Value Date   WBC 9.3 12/04/2014   HGB 11.6 12/04/2014   HCT 34.8 12/04/2014   MCV 94.3 12/04/2014   PLT 123* 12/04/2014    Radiographic Findings: No results found.  Impression:   Patient is 54 year old female with invasive ductal carcinoma of the right breast, lower inner quadrant clinical stage I (T1c, N0), post-lumpectomy. Patient is a good candidate for radiation treatment as a part of breast conservation treatment.  Plan:  Simulation July 13th at 9 am. Will reschedule if necessary depending on port-a-cath removal surgery.    -----------------------------------  Blair Promise, PhD, MD  This document serves as a record of services personally performed by [name], MD. It was created on his behalf by Derek Mound, a trained medical scribe. The creation of this record is based on the scribe's personal observations and the provider's statements to them. This document has been checked and approved by the  attending provider.

## 2014-12-19 NOTE — Progress Notes (Signed)
Please see the Nurse Progress Note in the MD Initial Consult Encounter for this patient. 

## 2014-12-19 NOTE — Telephone Encounter (Signed)
xxxxx 

## 2014-12-20 ENCOUNTER — Other Ambulatory Visit: Payer: Self-pay | Admitting: General Surgery

## 2014-12-26 ENCOUNTER — Ambulatory Visit: Payer: BC Managed Care – PPO | Admitting: Radiation Oncology

## 2014-12-27 ENCOUNTER — Encounter (HOSPITAL_COMMUNITY): Payer: Self-pay

## 2014-12-27 ENCOUNTER — Encounter (HOSPITAL_COMMUNITY)
Admission: RE | Admit: 2014-12-27 | Discharge: 2014-12-27 | Disposition: A | Discharge status: Home / Self Care | Payer: BC Managed Care – PPO | Source: Ambulatory Visit | Attending: General Surgery | Admitting: General Surgery

## 2014-12-27 DIAGNOSIS — Z01812 Encounter for preprocedural laboratory examination: Secondary | ICD-10-CM | POA: Insufficient documentation

## 2014-12-27 DIAGNOSIS — C50919 Malignant neoplasm of unspecified site of unspecified female breast: Secondary | ICD-10-CM | POA: Insufficient documentation

## 2014-12-27 HISTORY — DX: Gastro-esophageal reflux disease without esophagitis: K21.9

## 2014-12-27 LAB — BASIC METABOLIC PANEL
Anion gap: 8 (ref 5–15)
BUN: 12 mg/dL (ref 6–20)
CHLORIDE: 109 mmol/L (ref 101–111)
CO2: 24 mmol/L (ref 22–32)
CREATININE: 0.77 mg/dL (ref 0.44–1.00)
Calcium: 9.2 mg/dL (ref 8.9–10.3)
GFR calc Af Amer: >60 mL/min (ref >60)
GFR calc non Af Amer: >60 mL/min (ref >60)
Glucose, Bld: 138 mg/dL — ABNORMAL HIGH (ref 65–99)
Potassium: 3.8 mmol/L (ref 3.5–5.1)
Sodium: 141 mmol/L (ref 135–145)

## 2014-12-27 LAB — CBC WITH DIFFERENTIAL/PLATELET
BASOS PCT: 0 % (ref 0–1)
Basophils Absolute: 0 10*3/uL (ref 0.0–0.1)
Eosinophils Absolute: 0.1 10*3/uL (ref 0.0–0.7)
Eosinophils Relative: 2 % (ref 0–5)
HEMATOCRIT: 37.8 % (ref 36.0–46.0)
HEMOGLOBIN: 12.2 g/dL (ref 12.0–15.0)
Lymphocytes Relative: 22 % (ref 12–46)
Lymphs Abs: 1.2 10*3/uL (ref 0.7–4.0)
MCH: 30.9 pg (ref 26.0–34.0)
MCHC: 32.3 g/dL (ref 30.0–36.0)
MCV: 95.7 fL (ref 78.0–100.0)
MONO ABS: 0.5 10*3/uL (ref 0.1–1.0)
Monocytes Relative: 9 % (ref 3–12)
NEUTROS ABS: 3.9 10*3/uL (ref 1.7–7.7)
Neutrophils Relative %: 67 % (ref 43–77)
PLATELETS: 266 10*3/uL (ref 150–400)
RBC: 3.95 MIL/uL (ref 3.87–5.11)
RDW: 15.8 % — ABNORMAL HIGH (ref 11.5–15.5)
WBC: 5.7 10*3/uL (ref 4.0–10.5)

## 2014-12-27 NOTE — Progress Notes (Signed)
States she had a Stress Test years ago but does not remember where it was done.She was told that it was normal.  Since she has been on chemotherapy her blood sugars have been high,oncologist thinks that it was related to chemo treatments and since she has finished chemo they will come back down. She was never treated for the elevated glucose.

## 2014-12-27 NOTE — Pre-Procedure Instructions (Signed)
    Teresa Aguilar  12/27/2014      [id number] [place], [place] - [address] [place] [address] [place] [address] Phone: [phone] Fax: [phone]    Your procedure is scheduled on 12-31-2014    Monday    Report to [facility] Admitting at 9:00 A.M.   Call this number if you have problems the morning of surgery:   [phone]   Remember:  Do not eat food or drink liquids after midnight.   Take these medicines the morning of surgery with A SIP OF WATER Relpax if needed              Stop Aspirin,NSAIDS i.e. Ibuprofen, naproxen,aleve ,Goody's powder  Starting today   Do not wear jewelry, make-up or nail polish.  Do not wear lotions, powders, or perfumes.    Do not shave 48 hours prior to surgery.   .  Do not bring valuables to the hospital.  [facility] is not responsible for any belongings or valuables.  Contacts, dentures or bridgework may not be worn into surgery.  Leave your suitcase in the car.  After surgery it may be brought to your room.  For patients admitted to the hospital, discharge time will be determined by your treatment team.  Patients discharged the day of surgery will not be allowed to drive home  Special instructions:  See attach sheet :Preparing for Surgery" for instructions on CHG shower    Please read over the following fact sheets that you were given. Pain Booklet and Surgical Site Infection Prevention

## 2014-12-31 ENCOUNTER — Encounter (HOSPITAL_COMMUNITY)
Admission: RE | Disposition: A | Discharge status: Home / Self Care | Payer: Self-pay | Source: Ambulatory Visit | Attending: General Surgery

## 2014-12-31 ENCOUNTER — Encounter (HOSPITAL_COMMUNITY): Payer: Self-pay | Admitting: *Deleted

## 2014-12-31 ENCOUNTER — Ambulatory Visit (HOSPITAL_COMMUNITY): Payer: BC Managed Care – PPO | Admitting: Anesthesiology

## 2014-12-31 ENCOUNTER — Ambulatory Visit (HOSPITAL_COMMUNITY)
Admission: RE | Admit: 2014-12-31 | Discharge: 2014-12-31 | Disposition: A | Discharge status: Home / Self Care | Payer: BC Managed Care – PPO | Source: Ambulatory Visit | Attending: General Surgery | Admitting: General Surgery

## 2014-12-31 DIAGNOSIS — G43909 Migraine, unspecified, not intractable, without status migrainosus: Secondary | ICD-10-CM | POA: Insufficient documentation

## 2014-12-31 DIAGNOSIS — Z85038 Personal history of other malignant neoplasm of large intestine: Secondary | ICD-10-CM | POA: Insufficient documentation

## 2014-12-31 DIAGNOSIS — G8929 Other chronic pain: Secondary | ICD-10-CM | POA: Insufficient documentation

## 2014-12-31 DIAGNOSIS — Z981 Arthrodesis status: Secondary | ICD-10-CM | POA: Diagnosis not present

## 2014-12-31 DIAGNOSIS — I739 Peripheral vascular disease, unspecified: Secondary | ICD-10-CM | POA: Diagnosis not present

## 2014-12-31 DIAGNOSIS — M549 Dorsalgia, unspecified: Secondary | ICD-10-CM | POA: Insufficient documentation

## 2014-12-31 DIAGNOSIS — Z803 Family history of malignant neoplasm of breast: Secondary | ICD-10-CM | POA: Insufficient documentation

## 2014-12-31 DIAGNOSIS — Z853 Personal history of malignant neoplasm of breast: Secondary | ICD-10-CM | POA: Diagnosis not present

## 2014-12-31 DIAGNOSIS — Z452 Encounter for adjustment and management of vascular access device: Secondary | ICD-10-CM | POA: Insufficient documentation

## 2014-12-31 DIAGNOSIS — K219 Gastro-esophageal reflux disease without esophagitis: Secondary | ICD-10-CM | POA: Diagnosis not present

## 2014-12-31 DIAGNOSIS — M4696 Unspecified inflammatory spondylopathy, lumbar region: Secondary | ICD-10-CM | POA: Diagnosis not present

## 2014-12-31 DIAGNOSIS — C50311 Malignant neoplasm of lower-inner quadrant of right female breast: Secondary | ICD-10-CM | POA: Diagnosis not present

## 2014-12-31 DIAGNOSIS — Z86718 Personal history of other venous thrombosis and embolism: Secondary | ICD-10-CM | POA: Diagnosis not present

## 2014-12-31 DIAGNOSIS — E119 Type 2 diabetes mellitus without complications: Secondary | ICD-10-CM | POA: Diagnosis not present

## 2014-12-31 DIAGNOSIS — Z6841 Body Mass Index (BMI) 40.0 and over, adult: Secondary | ICD-10-CM | POA: Diagnosis not present

## 2014-12-31 DIAGNOSIS — Z9221 Personal history of antineoplastic chemotherapy: Secondary | ICD-10-CM | POA: Insufficient documentation

## 2014-12-31 DIAGNOSIS — I10 Essential (primary) hypertension: Secondary | ICD-10-CM | POA: Insufficient documentation

## 2014-12-31 HISTORY — PX: PORT-A-CATH REMOVAL: SHX5289

## 2014-12-31 LAB — GLUCOSE, CAPILLARY: GLUCOSE-CAPILLARY: 122 mg/dL — AB (ref 65–99)

## 2014-12-31 SURGERY — REMOVAL PORT-A-CATH
Anesthesia: Monitor Anesthesia Care | Site: Chest | Laterality: Right

## 2014-12-31 MED ORDER — ONDANSETRON HCL 4 MG/2ML IJ SOLN: 4.0000 mg | Freq: Once | INTRAMUSCULAR | Status: DC | PRN

## 2014-12-31 MED ORDER — FENTANYL CITRATE (PF) 100 MCG/2ML IJ SOLN
INTRAMUSCULAR | Status: DC | PRN
  Administered 2014-12-31 (×2): 50 ug via INTRAVENOUS

## 2014-12-31 MED ORDER — LACTATED RINGERS IV SOLN
INTRAVENOUS | Status: DC | PRN
  Administered 2014-12-31: 11:00:00 via INTRAVENOUS

## 2014-12-31 MED ORDER — HYDROMORPHONE HCL 1 MG/ML IJ SOLN
0.5000 mg | INTRAMUSCULAR | Status: DC | PRN
Start: 2014-12-31 — End: 2014-12-31

## 2014-12-31 MED ORDER — ONDANSETRON HCL 4 MG/2ML IJ SOLN
INTRAMUSCULAR | Status: DC | PRN
  Administered 2014-12-31: 4 mg via INTRAVENOUS

## 2014-12-31 MED ORDER — LACTATED RINGERS IV SOLN
INTRAVENOUS | Status: DC
  Administered 2014-12-31: 10:00:00 via INTRAVENOUS

## 2014-12-31 MED ORDER — FENTANYL CITRATE (PF) 250 MCG/5ML IJ SOLN
INTRAMUSCULAR | Status: AC
  Filled 2014-12-31: qty 5

## 2014-12-31 MED ORDER — 0.9 % SODIUM CHLORIDE (POUR BTL) OPTIME
TOPICAL | Status: DC | PRN
  Administered 2014-12-31: 1000 mL

## 2014-12-31 MED ORDER — BUPIVACAINE-EPINEPHRINE 0.25% -1:200000 IJ SOLN
INTRAMUSCULAR | Status: DC | PRN
  Administered 2014-12-31: 16 mL

## 2014-12-31 MED ORDER — PROPOFOL 10 MG/ML IV BOLUS
INTRAVENOUS | Status: DC | PRN
  Administered 2014-12-31 (×5): 20 mg via INTRAVENOUS

## 2014-12-31 MED ORDER — LIDOCAINE HCL (CARDIAC) 20 MG/ML IV SOLN
INTRAVENOUS | Status: DC | PRN
  Administered 2014-12-31: 10 mg via INTRAVENOUS
  Administered 2014-12-31 (×3): 20 mg via INTRAVENOUS
  Administered 2014-12-31: 30 mg via INTRAVENOUS

## 2014-12-31 MED ORDER — BUPIVACAINE-EPINEPHRINE (PF) 0.25% -1:200000 IJ SOLN
INTRAMUSCULAR | Status: AC
  Filled 2014-12-31: qty 30

## 2014-12-31 SURGICAL SUPPLY — 31 items
CHLORAPREP W/TINT 10.5 ML (MISCELLANEOUS) ×3 IMPLANT
COVER SURGICAL LIGHT HANDLE (MISCELLANEOUS) ×3 IMPLANT
DECANTER SPIKE VIAL GLASS SM (MISCELLANEOUS) ×3 IMPLANT
DRAPE PED LAPAROTOMY (DRAPES) ×3 IMPLANT
ELECT CAUTERY BLADE 6.4 (BLADE) ×3 IMPLANT
ELECT REM PT RETURN 9FT ADLT (ELECTROSURGICAL) ×3
ELECTRODE REM PT RTRN 9FT ADLT (ELECTROSURGICAL) ×1 IMPLANT
GAUZE SPONGE 4X4 16PLY XRAY LF (GAUZE/BANDAGES/DRESSINGS) ×3 IMPLANT
GLOVE BIO SURGEON STRL SZ7 (GLOVE) ×5 IMPLANT
GLOVE BIOGEL PI IND STRL 7.0 (GLOVE) IMPLANT
GLOVE BIOGEL PI IND STRL 7.5 (GLOVE) ×1 IMPLANT
GLOVE BIOGEL PI INDICATOR 7.0 (GLOVE) ×2
GLOVE BIOGEL PI INDICATOR 7.5 (GLOVE) ×2
GOWN STRL REUS W/ TWL LRG LVL3 (GOWN DISPOSABLE) ×2 IMPLANT
GOWN STRL REUS W/TWL LRG LVL3 (GOWN DISPOSABLE) ×6
KIT BASIN OR (CUSTOM PROCEDURE TRAY) ×3 IMPLANT
KIT ROOM TURNOVER OR (KITS) ×3 IMPLANT
LIQUID BAND (GAUZE/BANDAGES/DRESSINGS) ×3 IMPLANT
NDL HYPO 25GX1X1/2 BEV (NEEDLE) ×1 IMPLANT
NEEDLE HYPO 25GX1X1/2 BEV (NEEDLE) ×3 IMPLANT
NS IRRIG 1000ML POUR BTL (IV SOLUTION) ×3 IMPLANT
PACK SURGICAL SETUP 50X90 (CUSTOM PROCEDURE TRAY) ×3 IMPLANT
PAD ARMBOARD 7.5X6 YLW CONV (MISCELLANEOUS) ×6 IMPLANT
PENCIL BUTTON HOLSTER BLD 10FT (ELECTRODE) ×3 IMPLANT
SUT MNCRL AB 4-0 PS2 18 (SUTURE) ×3 IMPLANT
SUT VIC AB 3-0 SH 27 (SUTURE) ×3
SUT VIC AB 3-0 SH 27X BRD (SUTURE) ×1 IMPLANT
SYR CONTROL 10ML LL (SYRINGE) ×3 IMPLANT
TOWEL OR 17X24 6PK STRL BLUE (TOWEL DISPOSABLE) ×3 IMPLANT
TOWEL OR 17X26 10 PK STRL BLUE (TOWEL DISPOSABLE) ×3 IMPLANT
WATER STERILE IRR 1000ML POUR (IV SOLUTION) IMPLANT

## 2014-12-31 NOTE — Op Note (Signed)
Preoperative diagnosis: breast cancer, no longer needs venous access Postoperative diagnosis: same as above Procedure: port removal Surgeon: Dr Serita Grammes EBL: minimal Anes: local mac Specimens none Complications none Drains none Sponge count correct Dispo to pacu stable  Indications: This is a 71 yof who I know from breast cancer treatment.  She has completed chemotherapy.  She would like port removed before she starts radiation therapy.   Procedure: After informed consent was obtained the patient was taken to the operating room. Sequential compression devices were on her legs. She was then placed under monitored anesthesia care. The she was then prepped and draped in the standard sterile surgical fashion. Surgical timeout was then performed.  I infiltrated quarter percent marcaine in the area of the port. I then reentered the old incision. I removed the port and the line in their entirety.  I then closed the track with 2-0 vicryl. Hemostasis was observed.  I then closed this with 2-0 Vicryl and 4-0 Monocryl. Dermabond was placed on both the sides. She tolerated this well and was transferred to the recovery room in stable condition.

## 2014-12-31 NOTE — Anesthesia Preprocedure Evaluation (Signed)
Anesthesia Evaluation  Patient identified by MRN, date of birth, ID band Patient awake    Reviewed: Allergy & Precautions, NPO status , Patient's Chart, lab work & pertinent test results  Airway Mallampati: II       Dental   Pulmonary    Pulmonary exam normal       Cardiovascular hypertension, + Peripheral Vascular Disease Normal cardiovascular exam    Neuro/Psych  Headaches,    GI/Hepatic GERD-  ,  Endo/Other  diabetes, Type 2Morbid obesity  Renal/GU      Musculoskeletal  (+) Arthritis -,   Abdominal   Peds  Hematology   Anesthesia Other Findings Colon CA  Reproductive/Obstetrics                             Anesthesia Physical Anesthesia Plan  ASA: III  Anesthesia Plan: General   Post-op Pain Management:    Induction: Intravenous  Airway Management Planned: Oral ETT  Additional Equipment:   Intra-op Plan:   Post-operative Plan: Extubation in OR  Informed Consent: I have reviewed the patients History and Physical, chart, labs and discussed the procedure including the risks, benefits and alternatives for the proposed anesthesia with the patient or authorized representative who has indicated his/her understanding and acceptance.     Plan Discussed with: CRNA, Anesthesiologist and Surgeon  Anesthesia Plan Comments:         Anesthesia Quick Evaluation

## 2014-12-31 NOTE — Interval H&P Note (Signed)
History and Physical Interval Note:  12/31/2014 10:08 AM  Teresa Aguilar  has presented today for surgery, with the diagnosis of BREAST CANCER  The various methods of treatment have been discussed with the patient and family. After consideration of risks, benefits and other options for treatment, the patient has consented to  Procedure(s): REMOVAL PORT-A-CATH (N/A) as a surgical intervention .  The patient's history has been reviewed, patient examined, no change in status, stable for surgery.  I have reviewed the patient's chart and labs.  Questions were answered to the patient's satisfaction.     Natash Berman

## 2014-12-31 NOTE — Transfer of Care (Signed)
Immediate Anesthesia Transfer of Care Note  Patient: Teresa Aguilar  Procedure(s) Performed: Procedure(s): REMOVAL PORT-A-CATH (Right)  Patient Location: PACU  Anesthesia Type:MAC  Level of Consciousness: awake, alert , oriented and sedated  Airway & Oxygen Therapy: Patient Spontanous Breathing and Patient connected to nasal cannula oxygen  Post-op Assessment: Report given to RN, Post -op Vital signs reviewed and stable and Patient moving all extremities  Post vital signs: Reviewed and stable  Last Vitals:  Filed Vitals:   12/31/14 1124  BP:   Pulse:   Temp: 36.5 C  Resp:     Complications: No apparent anesthesia complications

## 2014-12-31 NOTE — H&P (Signed)
Teresa Aguilar is an 54 y.o. female.   Chief Complaint: no longer needs port HPI: 28 yof s/p chemo for breast cancer about to start radiotherapy. Would like to have port removed.    Past Medical History  Diagnosis Date  . Hypoxia     after surgery  . DVT (deep venous thrombosis) 03/2014    R leg, post op- on Xarelto   . Hypertension     pt. reports that she has been higher in the past & again now but doesn't take any med.,never has for ^BP, pt. stating that BP is high now b/c of her pain in her back.    . H/O cardiovascular stress test 2012     test done for stress from her Father dying, had chest pain ,pt. had a stress/echo  test, told it was wnl  . Anxiety     relative for to pain & frustration of prev. hospitalization   . Gestational diabetes 1986; 1996  . Migraines     "at least q other week" (08/27/2014)  . Arthritis     degenerative lumbar spine    . Chronic back pain   . Breast cancer of lower-inner quadrant of right female breast   . Colon cancer   . Breast cancer 2016  . Family history of breast cancer   . Family history of colon cancer   . PONV (postoperative nausea and vomiting)     low O2 sats, < 50% post op  . Family history of adverse reaction to anesthesia     PONV  . GERD (gastroesophageal reflux disease)     used during chemo, has not taken recently    Past Surgical History  Procedure Laterality Date  . Endometrial ablation  ~ 1998  . Colonoscopy w/ polypectomy  2015    found tubulovillous adenoma with focal high grade displasia  . Plantar fascia release Right ~ 2000  . Eye muscle surgery Bilateral ~ 1965    to fix cross-eye as child  . Sacroiliac joint fusion Right 02/22/2014    Procedure: SACROILIAC JOINT FUSION;  Surgeon: Sinclair Ship, MD;  Location: [facility];  Service: Orthopedics;  Laterality: Right;  Right sided sacroiliac joint fusion  . Radiofrequency ablation nerves      x 2 to nerves in her lower back  . Dental surgery  ~ 2011   "replaced bone graft upper jaw; put 2 implants in"  . Tubal ligation  1996  . Axillary sentinel node biopsy Right 08/27/2014    Archie Endo 08/27/2014  . Tonsillectomy  1988  . Excisional hemorrhoidectomy  2015  . Back surgery    . Breast biopsy Right 07/24/2014    core biopsy  . Breast lumpectomy Right 08/27/2014    Archie Endo 08/27/2014  . Radioactive seed guided mastectomy with axillary sentinel lymph node biopsy Right 08/27/2014    Procedure: RADIOACTIVE SEED GUIDED RIGHT BREAST LUMPECTOMY WITH RIGHT AXILLARY SENTINEL LYMPH NODE BIOPSY;  Surgeon: [name], MD;  Location: [facility];  Service: General;  Laterality: Right;  . Portacath placement N/A 09/19/2014    Procedure: INSERTION PORT-A-CATH;  Surgeon: [name], MD;  Location: [facility];  Service: General;  Laterality: N/A;    Family History  Problem Relation Age of Onset  . Breast cancer Cousin 30    maternal first cousin  . Colon cancer Maternal Aunt 75  . Throat cancer Maternal Aunt 75    smoker  . Breast cancer Paternal Aunt 35  . Heart disease  Mother   . Heart disease Father   . Diabetes type II Father   . Stomach cancer Maternal Uncle     dx in his 15s  . Colon cancer Cousin 55     maternal first cousin  . Prostate cancer Cousin 51    paternal first cousin  . Breast cancer Paternal Aunt     dx in her 16s  . Breast cancer Other     mother's paternal first cousin  . Colon cancer Other 66    MGF's sister  . Brain cancer Other 73    MGFs sister  . Breast cancer Other     MGF's paternal aunt  . Prostate cancer Other     MGF's paternal first cousin   Social History:  reports that she has never smoked. She has never used smokeless tobacco. She reports that she does not drink alcohol or use illicit drugs.  Allergies:  Allergies  Allergen Reactions  . Morphine And Related Nausea And Vomiting  . Sulfa Antibiotics Hives    Medications Prior to Admission  Medication Sig Dispense Refill  . eletriptan (RELPAX) 40 MG  tablet Take 40 mg by mouth as needed for migraine or headache. One tablet by mouth at onset of headache. May repeat in 2 hours if headache persists or recurs.    Marland Kitchen ibuprofen (ADVIL,MOTRIN) 200 MG tablet Take 200 mg by mouth every 6 (six) hours as needed for mild pain or moderate pain.    Marland Kitchen ketoconazole (NIZORAL) 2 % shampoo Apply 1 application topically 2 (two) times a week. (Patient not taking: Reported on 12/19/2014) 120 mL 0  . omeprazole (PRILOSEC) 40 MG capsule Take 1 capsule (40 mg total) by mouth daily. (Patient not taking: Reported on 12/19/2014) 30 capsule 2    No results found for this or any previous visit (from the past 48 hour(s)). No results found.  Review of Systems  All other systems reviewed and are negative.   Blood pressure 145/87, pulse 88, temperature 97.9 F (36.6 C), temperature source Oral, resp. rate 20, height 5\' 8"  (1.727 m), weight 125.193 kg (276 lb), SpO2 100 %. Physical Exam  Constitutional: She appears well-developed and well-nourished.  Cardiovascular: Normal rate and regular rhythm.   Respiratory: Effort normal and breath sounds normal.  Port in place right side     Assessment/Plan Breast cancer no longer needs venous access Port removal  Teresa Aguilar 12/31/2014, 10:06 AM

## 2014-12-31 NOTE — Anesthesia Postprocedure Evaluation (Signed)
  Anesthesia Post-op Note  Patient: Teresa Aguilar  Procedure(s) Performed: Procedure(s): REMOVAL PORT-A-CATH (Right)  Patient Location: PACU  Anesthesia Type:MAC  Level of Consciousness: awake, alert , oriented and patient cooperative  Airway and Oxygen Therapy: Patient Spontanous Breathing  Post-op Pain: none  Post-op Assessment: Post-op Vital signs reviewed, Patient's Cardiovascular Status Stable, Respiratory Function Stable, Patent Airway, No signs of Nausea or vomiting and Pain level controlled              Post-op Vital Signs: stable  Last Vitals:  Filed Vitals:   12/31/14 0902  BP: 145/87  Pulse: 88  Temp: 36.6 C  Resp: 20    Complications: No apparent anesthesia complications

## 2014-12-31 NOTE — Discharge Instructions (Signed)
° ° °   POST OP INSTRUCTIONS  Always review your discharge instruction sheet given to you by the facility where your surgery was performed.   1. A prescription for pain medication may be given to you upon discharge. Take your pain medication as prescribed, if needed. If narcotic pain medicine is not needed, then you make take acetaminophen (Tylenol) or ibuprofen (Advil) as needed.  2. Take your usually prescribed medications unless otherwise directed.  3. You should follow a light diet for the remainder of the day after your procedure. 4. Most patients will experience some mild swelling and/or bruising in the area of the incision. It may take several days to resolve. 5. It is common to experience some constipation if taking pain medication after surgery. Increasing fluid intake and taking a stool softener (such as Colace) will usually help or prevent this problem from occurring. A mild laxative (Milk of Magnesia or Miralax) should be taken according to package directions if there are no bowel movements after 48 hours.  6.  Your surgeon used Dermabond (skin glue) on the incision, you may shower in 24 hours.  The glue will flake off over the next 2-3 weeks.  7. ACTIVITIES:  Limit activity involving your arms for the next 72 hours. Do no strenuous exercise or activity for 1 week. You may drive when you are no longer taking prescription pain medication, you can comfortably wear a seatbelt, and you can maneuver your car. 10.You may need to see your doctor in the office for a follow-up appointment.  Please       check with your doctor.   WHEN TO CALL YOUR DOCTOR [phone]): 1. Fever over 101.0 2. Chills 3. Continued bleeding from incision 4. Increased redness and tenderness at the site 5. Shortness of breath, difficulty breathing   The clinic staff is available to answer your questions during regular business hours. Please dont hesitate to call and ask to speak to one of the nurses or medical  assistants for clinical concerns. If you have a medical emergency, go to the nearest emergency room or call 911.  A surgeon from [facility] is always on call at the hospital.     For further information, please visit [email or web address]

## 2015-01-01 ENCOUNTER — Encounter (HOSPITAL_COMMUNITY): Payer: Self-pay | Admitting: General Surgery

## 2015-01-03 ENCOUNTER — Ambulatory Visit
Admission: RE | Admit: 2015-01-03 | Discharge: 2015-01-03 | Disposition: A | Discharge status: Home / Self Care | Payer: BC Managed Care – PPO | Source: Ambulatory Visit | Attending: Radiation Oncology | Admitting: Radiation Oncology

## 2015-01-03 DIAGNOSIS — C50311 Malignant neoplasm of lower-inner quadrant of right female breast: Secondary | ICD-10-CM | POA: Diagnosis not present

## 2015-01-03 NOTE — Progress Notes (Signed)
  Radiation Oncology         [phone] ________________________________  Name: [name] MRN: [id number]  Date: 01/03/2015  DOB: [birth year]  SIMULATION AND TREATMENT PLANNING NOTE  DIAGNOSIS: Teresa Aguilar is a 54 year old female presenting to clinic in regards to her estrogen receptor positive breast cancer.  pT1c pN0, stage IA invasive ductal carcinoma of the right breast, grade 3    ICD-9-CM ICD-10-CM   1. Carcinoma of lower-inner quadrant of right breast 174.3 C50.311     NARRATIVE:  The patient was brought to the [facility].  Identity was confirmed.  All relevant records and images related to the planned course of therapy were reviewed.  The patient freely provided informed written consent to proceed with treatment after reviewing the details related to the planned course of therapy. The consent form was witnessed and verified by the simulation staff.  Then, the patient was set-up in a stable reproducible  supine position for radiation therapy.  CT images were obtained.  Surface markings were placed.  The CT images were loaded into the planning software.  Then the target and avoidance structures were contoured.  Treatment planning then occurred.  The radiation prescription was entered and confirmed.  Then, I designed and supervised the construction of a total of 3 medically necessary complex treatment devices.  I have requested : 3D Simulation  I have requested a DVH of the following structures: heart, lungs, lumpectomy cavity, right breast.  I have ordered:dose calc.  PLAN:  The patient will receive 50.4 Gy in 28 fractions followed by a boost to the lumpectomy cavity of 12 gray and a cumulative dose to the lumpectomy cavity of 62.0 gray.  This document serves as a record of services personally performed by [name], MD. It was created on his behalf by Lenn Cal, a trained medical scribe. The creation of this record is based on the scribe's personal  observations and the provider's statements to them. This document has been checked and approved by the attending provider.    -----------------------------------  Blair Promise, PhD, MD

## 2015-01-09 ENCOUNTER — Ambulatory Visit
Admission: RE | Admit: 2015-01-09 | Discharge: 2015-01-09 | Disposition: A | Discharge status: Home / Self Care | Payer: BC Managed Care – PPO | Source: Ambulatory Visit | Attending: Radiation Oncology | Admitting: Radiation Oncology

## 2015-01-10 ENCOUNTER — Ambulatory Visit: Payer: BC Managed Care – PPO | Admitting: Radiation Oncology

## 2015-01-14 ENCOUNTER — Ambulatory Visit: Payer: BC Managed Care – PPO | Admitting: Radiation Oncology

## 2015-01-14 ENCOUNTER — Ambulatory Visit: Payer: BC Managed Care – PPO

## 2015-01-15 ENCOUNTER — Ambulatory Visit: Payer: BC Managed Care – PPO

## 2015-01-15 DIAGNOSIS — C50311 Malignant neoplasm of lower-inner quadrant of right female breast: Secondary | ICD-10-CM | POA: Diagnosis not present

## 2015-01-16 ENCOUNTER — Ambulatory Visit
Admission: RE | Admit: 2015-01-16 | Discharge: 2015-01-16 | Disposition: A | Discharge status: Home / Self Care | Payer: BC Managed Care – PPO | Source: Ambulatory Visit | Attending: Radiation Oncology | Admitting: Radiation Oncology

## 2015-01-16 ENCOUNTER — Ambulatory Visit: Payer: BC Managed Care – PPO

## 2015-01-16 ENCOUNTER — Telehealth: Payer: Self-pay | Admitting: Oncology

## 2015-01-16 DIAGNOSIS — C50311 Malignant neoplasm of lower-inner quadrant of right female breast: Secondary | ICD-10-CM | POA: Diagnosis not present

## 2015-01-16 NOTE — Telephone Encounter (Signed)
Patient came up from radiation and asked about her upcoming dexa scan and dr Jana Hakim.  She had gotten started late on radiation and per stacey/dr magrinat she can go ahead with her dexa scan but does not need to see him until radiation is complete.  Appointment moved and patient placed this on her schedule  anne

## 2015-01-17 ENCOUNTER — Ambulatory Visit: Payer: BC Managed Care – PPO

## 2015-01-17 ENCOUNTER — Ambulatory Visit
Admission: RE | Admit: 2015-01-17 | Discharge: 2015-01-17 | Disposition: A | Discharge status: Home / Self Care | Payer: BC Managed Care – PPO | Source: Ambulatory Visit | Attending: Radiation Oncology | Admitting: Radiation Oncology

## 2015-01-17 DIAGNOSIS — C50311 Malignant neoplasm of lower-inner quadrant of right female breast: Secondary | ICD-10-CM

## 2015-01-17 MED ORDER — ALRA NON-METALLIC DEODORANT (RAD-ONC)
1.0000 "application " | Freq: Once | TOPICAL | Status: AC
  Administered 2015-01-17: 1 via TOPICAL

## 2015-01-17 MED ORDER — RADIAPLEXRX EX GEL
Freq: Once | CUTANEOUS | Status: AC
  Administered 2015-01-17: 14:00:00 via TOPICAL

## 2015-01-17 NOTE — Progress Notes (Signed)
Pt here for patient teaching.  Pt given Radiation and You booklet, skin care instructions, Alra deodorant and Radiaplex gel. Pt reports they have not watched the Radiation Therapy Education video .  Reviewed areas of pertinence such as fatigue, skin changes, breast tenderness and breast swelling . Pt able to give teach back of to pat skin and use unscented/gentle soap,apply Radiaplex bid and avoid applying anything to skin within 4 hours of treatment. Pt demonstrated understanding and verbalizes understanding of information given and will contact nursing with any questions or concerns.    Pt states they have not watched the Radiation Therapy Education.  On 01/17/15, patient was given the link to the video and stated that she will watch it at home.

## 2015-01-17 NOTE — Addendum Note (Signed)
Encounter addended by: Jacqulyn Liner, RN on: 01/17/2015  3:29 PM<BR>     Documentation filed: Inpatient Patient Education

## 2015-01-18 ENCOUNTER — Telehealth: Payer: Self-pay | Admitting: *Deleted

## 2015-01-18 ENCOUNTER — Ambulatory Visit
Admission: RE | Admit: 2015-01-18 | Discharge: 2015-01-18 | Disposition: A | Discharge status: Home / Self Care | Payer: BC Managed Care – PPO | Source: Ambulatory Visit | Attending: Radiation Oncology | Admitting: Radiation Oncology

## 2015-01-18 DIAGNOSIS — C50311 Malignant neoplasm of lower-inner quadrant of right female breast: Secondary | ICD-10-CM | POA: Diagnosis not present

## 2015-01-18 NOTE — Telephone Encounter (Signed)
Spoke to pt to assess needs during xrt. Relate doing well and without complaints. Encourage pt to call with questions or concerns. Received verbal understanding.

## 2015-01-21 ENCOUNTER — Ambulatory Visit
Admission: RE | Admit: 2015-01-21 | Discharge: 2015-01-21 | Disposition: A | Discharge status: Home / Self Care | Payer: BC Managed Care – PPO | Source: Ambulatory Visit | Attending: Radiation Oncology | Admitting: Radiation Oncology

## 2015-01-21 DIAGNOSIS — C50311 Malignant neoplasm of lower-inner quadrant of right female breast: Secondary | ICD-10-CM | POA: Diagnosis not present

## 2015-01-22 ENCOUNTER — Ambulatory Visit
Admission: RE | Admit: 2015-01-22 | Discharge: 2015-01-22 | Disposition: A | Discharge status: Home / Self Care | Payer: BC Managed Care – PPO | Source: Ambulatory Visit | Attending: Radiation Oncology | Admitting: Radiation Oncology

## 2015-01-22 ENCOUNTER — Encounter: Payer: Self-pay | Admitting: Radiation Oncology

## 2015-01-22 VITALS — BP 141/79 | HR 84 | Temp 98.2°F | Resp 16 | Ht 68.0 in | Wt 276.2 lb

## 2015-01-22 DIAGNOSIS — C50311 Malignant neoplasm of lower-inner quadrant of right female breast: Secondary | ICD-10-CM

## 2015-01-22 NOTE — Progress Notes (Signed)
Teresa Aguilar has completed 4 fractions to her right breast.  She denies pain in her breast.  She continue to have chronic lower back pain.  She reports having nausea after treatment yesterday.  She reports feeling like the skin on her face is flushed. The skin on her right breast is red.  She is using radipalex gel.  BP 141/79 mmHg  Pulse 84  Temp(Src) 98.2 F (36.8 C) (Oral)  Resp 16  Ht 5\' 8"  (1.727 m)  Wt 276 lb 3.2 oz (125.283 kg)  BMI 42.01 kg/m2

## 2015-01-22 NOTE — Progress Notes (Signed)
  Radiation Oncology         [phone] ________________________________  Name: Teresa Aguilar MRN: [id number]  Date: 01/22/2015  DOB: 12/01/1960  Weekly Radiation Therapy Management    ICD-9-CM ICD-10-CM   1. Carcinoma of lower-inner quadrant of right breast 174.3 C50.311     Current Dose: 7.2 Gy     Planned Dose:  62.4 Gy  Narrative . . . . . . . . The patient presents for routine under treatment assessment.                                   The patient has some discomfort along her right neck which was initially thought to be related to her Port-A-Cath. Her surgeon however felt that the catheter did not extend up into the right lower neck. Port-A-Cath has been removed at this time. Possibly  related to sclerosis from chemotherapy? No discomfort or itching in the right breast at this time.                                 Set-up films were reviewed.                                 The chart was checked. Physical Findings. . .  height is 5\' 8"  (1.727 m) and weight is 276 lb 3.2 oz (125.283 kg). Her oral temperature is 98.2 F (36.8 C). Her blood pressure is 141/79 and her pulse is 84. Her respiration is 16. . The lungs are clear. The heart has a regular rhythm and rate. The right breast area shows some mild erythema. Examination right neck reveals no palpable adenopathy. There is some thickening along one of the superficial veins of the right lower neck as well as what appears to be some scar tissue in the surrounding area. Impression . . . . . . . The patient is tolerating radiation. Plan . . . . . . . . . . . . Continue treatment as planned. I have encouraged patient to discuss her neck issue with medical oncology to see if this may possibly be related to her prior chemotherapy.  ________________________________   Blair Promise, PhD, MD

## 2015-01-23 ENCOUNTER — Ambulatory Visit
Admission: RE | Admit: 2015-01-23 | Discharge: 2015-01-23 | Disposition: A | Discharge status: Home / Self Care | Payer: BC Managed Care – PPO | Source: Ambulatory Visit | Attending: Radiation Oncology | Admitting: Radiation Oncology

## 2015-01-23 DIAGNOSIS — C50311 Malignant neoplasm of lower-inner quadrant of right female breast: Secondary | ICD-10-CM | POA: Diagnosis not present

## 2015-01-24 ENCOUNTER — Ambulatory Visit
Admission: RE | Admit: 2015-01-24 | Discharge: 2015-01-24 | Disposition: A | Discharge status: Home / Self Care | Payer: BC Managed Care – PPO | Source: Ambulatory Visit | Attending: Oncology | Admitting: Oncology

## 2015-01-24 ENCOUNTER — Ambulatory Visit
Admission: RE | Admit: 2015-01-24 | Discharge: 2015-01-24 | Disposition: A | Discharge status: Home / Self Care | Payer: BC Managed Care – PPO | Source: Ambulatory Visit | Attending: Radiation Oncology | Admitting: Radiation Oncology

## 2015-01-24 DIAGNOSIS — M8580 Other specified disorders of bone density and structure, unspecified site: Secondary | ICD-10-CM

## 2015-01-24 DIAGNOSIS — C50311 Malignant neoplasm of lower-inner quadrant of right female breast: Secondary | ICD-10-CM | POA: Diagnosis not present

## 2015-01-25 ENCOUNTER — Ambulatory Visit
Admission: RE | Admit: 2015-01-25 | Discharge: 2015-01-25 | Disposition: A | Discharge status: Home / Self Care | Payer: BC Managed Care – PPO | Source: Ambulatory Visit | Attending: Radiation Oncology | Admitting: Radiation Oncology

## 2015-01-25 DIAGNOSIS — C50311 Malignant neoplasm of lower-inner quadrant of right female breast: Secondary | ICD-10-CM | POA: Diagnosis not present

## 2015-01-28 ENCOUNTER — Ambulatory Visit
Admission: RE | Admit: 2015-01-28 | Discharge: 2015-01-28 | Disposition: A | Discharge status: Home / Self Care | Payer: BC Managed Care – PPO | Source: Ambulatory Visit | Attending: Radiation Oncology | Admitting: Radiation Oncology

## 2015-01-28 DIAGNOSIS — C50311 Malignant neoplasm of lower-inner quadrant of right female breast: Secondary | ICD-10-CM | POA: Diagnosis not present

## 2015-01-29 ENCOUNTER — Ambulatory Visit
Admission: RE | Admit: 2015-01-29 | Discharge: 2015-01-29 | Disposition: A | Discharge status: Home / Self Care | Payer: BC Managed Care – PPO | Source: Ambulatory Visit | Attending: Radiation Oncology | Admitting: Radiation Oncology

## 2015-01-29 ENCOUNTER — Encounter: Payer: Self-pay | Admitting: Radiation Oncology

## 2015-01-29 ENCOUNTER — Ambulatory Visit: Payer: BC Managed Care – PPO

## 2015-01-29 VITALS — BP 146/93 | HR 83 | Temp 98.2°F | Resp 12 | Ht 68.0 in | Wt 275.6 lb

## 2015-01-29 DIAGNOSIS — C50311 Malignant neoplasm of lower-inner quadrant of right female breast: Secondary | ICD-10-CM | POA: Diagnosis not present

## 2015-01-29 NOTE — Progress Notes (Signed)
  Radiation Oncology         [phone] ________________________________  Name: Teresa Aguilar MRN: [id number]  Date: 01/29/2015  DOB: 09/29/1960  Weekly Radiation Therapy Management    ICD-9-CM ICD-10-CM   1. Carcinoma of lower-inner quadrant of right breast 174.3 C50.311     Current Dose: 16.2 Gy     Planned Dose:  62.4 Gy  Narrative . . . . . . . . The patient presents for routine under treatment assessment.                                  She reports soreness in her right nipple area. She denies having fatigue. She reports her face has been flushing at night after radiation. It does not happen on the weekends. She denies any breathing problems or cough                                 Set-up films were reviewed.                                 The chart was checked. Physical Findings. . .  height is 5\' 8"  (1.727 m) and weight is 275 lb 9.6 oz (125.011 kg). Her oral temperature is 98.2 F (36.8 C). Her blood pressure is 146/93 and her pulse is 83. Her respiration is 12. . The lungs are clear. The heart has a regular rhythm and rate. The right breast area shows diffuse mild erythema without any skin breakdown Impression . . . . . . . The patient is tolerating radiation. Plan . . . . . . . . . . . . Continue treatment as planned.  ________________________________   Blair Promise, PhD, MD

## 2015-01-29 NOTE — Progress Notes (Signed)
Teresa Aguilar has completed 9 treatments to her right breast.  She reports soreness in her right nipple area.  She denies having fatigue.  She reports her face has been flushing at night after radiation.  It does not happen on the weekends.  The skin on her right breast is red.  She is using radiaplex.   BP 146/93 mmHg  Pulse 83  Temp(Src) 98.2 F (36.8 C) (Oral)  Resp 12  Ht 5\' 8"  (1.727 m)  Wt 275 lb 9.6 oz (125.011 kg)  BMI 41.91 kg/m2

## 2015-01-30 ENCOUNTER — Ambulatory Visit
Admission: RE | Admit: 2015-01-30 | Discharge: 2015-01-30 | Disposition: A | Discharge status: Home / Self Care | Payer: BC Managed Care – PPO | Source: Ambulatory Visit | Attending: Radiation Oncology | Admitting: Radiation Oncology

## 2015-01-30 DIAGNOSIS — C50311 Malignant neoplasm of lower-inner quadrant of right female breast: Secondary | ICD-10-CM | POA: Diagnosis not present

## 2015-01-31 ENCOUNTER — Ambulatory Visit
Admission: RE | Admit: 2015-01-31 | Discharge: 2015-01-31 | Disposition: A | Discharge status: Home / Self Care | Payer: BC Managed Care – PPO | Source: Ambulatory Visit | Attending: Radiation Oncology | Admitting: Radiation Oncology

## 2015-01-31 DIAGNOSIS — C50311 Malignant neoplasm of lower-inner quadrant of right female breast: Secondary | ICD-10-CM | POA: Diagnosis not present

## 2015-02-01 ENCOUNTER — Ambulatory Visit
Admission: RE | Admit: 2015-02-01 | Discharge: 2015-02-01 | Disposition: A | Discharge status: Home / Self Care | Payer: BC Managed Care – PPO | Source: Ambulatory Visit | Attending: Radiation Oncology | Admitting: Radiation Oncology

## 2015-02-01 DIAGNOSIS — C50311 Malignant neoplasm of lower-inner quadrant of right female breast: Secondary | ICD-10-CM | POA: Diagnosis not present

## 2015-02-04 ENCOUNTER — Ambulatory Visit
Admission: RE | Admit: 2015-02-04 | Discharge: 2015-02-04 | Disposition: A | Discharge status: Home / Self Care | Payer: BC Managed Care – PPO | Source: Ambulatory Visit | Attending: Radiation Oncology | Admitting: Radiation Oncology

## 2015-02-04 DIAGNOSIS — Z510 Encounter for antineoplastic radiation therapy: Secondary | ICD-10-CM | POA: Diagnosis not present

## 2015-02-04 DIAGNOSIS — C50311 Malignant neoplasm of lower-inner quadrant of right female breast: Secondary | ICD-10-CM | POA: Diagnosis present

## 2015-02-05 ENCOUNTER — Ambulatory Visit
Admission: RE | Admit: 2015-02-05 | Discharge: 2015-02-05 | Disposition: A | Discharge status: Home / Self Care | Payer: BC Managed Care – PPO | Source: Ambulatory Visit | Attending: Radiation Oncology | Admitting: Radiation Oncology

## 2015-02-05 ENCOUNTER — Encounter: Payer: Self-pay | Admitting: Radiation Oncology

## 2015-02-05 VITALS — BP 145/96 | HR 92 | Resp 16 | Wt 275.5 lb

## 2015-02-05 DIAGNOSIS — C50311 Malignant neoplasm of lower-inner quadrant of right female breast: Secondary | ICD-10-CM | POA: Insufficient documentation

## 2015-02-05 DIAGNOSIS — Z510 Encounter for antineoplastic radiation therapy: Secondary | ICD-10-CM | POA: Diagnosis not present

## 2015-02-05 MED ORDER — BIAFINE EX EMUL
Freq: Every day | CUTANEOUS | Status: DC
  Administered 2015-02-05: 12:00:00 via TOPICAL

## 2015-02-05 NOTE — Progress Notes (Signed)
  Radiation Oncology         [phone] ________________________________  Name: Teresa Aguilar MRN: [id number]  Date: 02/05/2015  DOB: 01-Dec-1960  Weekly Radiation Therapy Management    ICD-9-CM ICD-10-CM   1. Carcinoma of lower-inner quadrant of right breast 174.3 C50.311     Current Dose: 25.2 Gy     Planned Dose:  62.4 Gy  Narrative . . . . . . . . The patient presents for routine under treatment assessment.                                   Denies pain. Patient reports area of dermatitis is very itchy. Denies nipple discharge. Reports right breast is tender. Reports fatigue.                                 Set-up films were reviewed.                                 The chart was checked. Physical Findings. . .  weight is 275 lb 8 oz (124.966 kg). Her blood pressure is 145/96 and her pulse is 92. Her respiration is 16. . The lungs are clear. The heart has a regular rhythm and rate. Radiation dermatitis noted in the upper inner right chest. Mild erythema throughout the right breast. No skin breakdown. Impression . . . . . . . The patient is tolerating radiation. Plan . . . . . . . . . . . . Continue treatment as planned. Switched to [employer].  ________________________________   Blair Promise, PhD, MD

## 2015-02-05 NOTE — Progress Notes (Signed)
Weight stable. BP elevated. Denies pain. Dermatitis noted right chest wall. Patient reports area of dermatitis is very itchy. Area of desquamation noted right axilla. No lymphedema noted. Denies nipple discharge. Reports right breast is tender. Reports fatigue.  BP 145/96 mmHg  Pulse 92  Resp 16  Wt 275 lb 8 oz (124.966 kg) Wt Readings from Last 3 Encounters:  02/05/15 275 lb 8 oz (124.966 kg)  01/29/15 275 lb 9.6 oz (125.011 kg)  01/22/15 276 lb 3.2 oz (125.283 kg)

## 2015-02-05 NOTE — Addendum Note (Signed)
Encounter addended by: Heywood Footman, RN on: 02/05/2015 12:22 PM<BR>     Documentation filed: Medications, Orders, Dx Association, Inpatient [facility]

## 2015-02-06 ENCOUNTER — Ambulatory Visit
Admission: RE | Admit: 2015-02-06 | Discharge: 2015-02-06 | Disposition: A | Discharge status: Home / Self Care | Payer: BC Managed Care – PPO | Source: Ambulatory Visit | Attending: Radiation Oncology | Admitting: Radiation Oncology

## 2015-02-06 DIAGNOSIS — Z510 Encounter for antineoplastic radiation therapy: Secondary | ICD-10-CM | POA: Insufficient documentation

## 2015-02-06 DIAGNOSIS — C50311 Malignant neoplasm of lower-inner quadrant of right female breast: Secondary | ICD-10-CM | POA: Insufficient documentation

## 2015-02-07 ENCOUNTER — Ambulatory Visit
Admission: RE | Admit: 2015-02-07 | Discharge: 2015-02-07 | Disposition: A | Discharge status: Home / Self Care | Payer: BC Managed Care – PPO | Source: Ambulatory Visit | Attending: Radiation Oncology | Admitting: Radiation Oncology

## 2015-02-07 DIAGNOSIS — Z510 Encounter for antineoplastic radiation therapy: Secondary | ICD-10-CM | POA: Diagnosis not present

## 2015-02-08 ENCOUNTER — Ambulatory Visit
Admission: RE | Admit: 2015-02-08 | Discharge: 2015-02-08 | Disposition: A | Discharge status: Home / Self Care | Payer: BC Managed Care – PPO | Source: Ambulatory Visit | Attending: Radiation Oncology | Admitting: Radiation Oncology

## 2015-02-08 DIAGNOSIS — Z510 Encounter for antineoplastic radiation therapy: Secondary | ICD-10-CM | POA: Diagnosis not present

## 2015-02-09 ENCOUNTER — Ambulatory Visit: Payer: BC Managed Care – PPO

## 2015-02-11 ENCOUNTER — Ambulatory Visit
Admission: RE | Admit: 2015-02-11 | Discharge: 2015-02-11 | Disposition: A | Discharge status: Home / Self Care | Payer: BC Managed Care – PPO | Source: Ambulatory Visit | Attending: Radiation Oncology | Admitting: Radiation Oncology

## 2015-02-11 DIAGNOSIS — Z510 Encounter for antineoplastic radiation therapy: Secondary | ICD-10-CM | POA: Diagnosis not present

## 2015-02-12 ENCOUNTER — Encounter: Payer: Self-pay | Admitting: Radiation Oncology

## 2015-02-12 ENCOUNTER — Ambulatory Visit
Admission: RE | Admit: 2015-02-12 | Discharge: 2015-02-12 | Disposition: A | Discharge status: Home / Self Care | Payer: BC Managed Care – PPO | Source: Ambulatory Visit | Attending: Radiation Oncology | Admitting: Radiation Oncology

## 2015-02-12 VITALS — BP 139/72 | HR 87 | Temp 97.7°F | Resp 16 | Ht 68.0 in | Wt 276.9 lb

## 2015-02-12 DIAGNOSIS — Z510 Encounter for antineoplastic radiation therapy: Secondary | ICD-10-CM | POA: Diagnosis not present

## 2015-02-12 DIAGNOSIS — C50311 Malignant neoplasm of lower-inner quadrant of right female breast: Secondary | ICD-10-CM | POA: Diagnosis not present

## 2015-02-12 NOTE — Progress Notes (Signed)
Teresa Aguilar has completed 19 fractions to her right breast.  She reports pain under her breast with activity.  She reports fatigue.  She is due to have a colonoscopy and is wondering if it is OK to have this during radiation.  She reports her right breast has been itching.  Recommended using 1% hydrocortisone cream to the itching areas after treatment.  The skin on her right breast and underarm area is red.  She has a scattered red rash in her upper central right chest area.  She has 2 blisters under her right breast.  She has been putting neosporin on them.  She is using biafine cream as well.  She will be given hydrogel pads to put under her right chest.  BP 139/72 mmHg  Pulse 87  Temp(Src) 97.7 F (36.5 C) (Oral)  Resp 16  Ht 5\' 8"  (1.727 m)  Wt 276 lb 14.4 oz (125.601 kg)  BMI 42.11 kg/m2

## 2015-02-12 NOTE — Progress Notes (Signed)
  Radiation Oncology         [phone] ________________________________  Name: Teresa Aguilar MRN: [id number]  Date: 02/12/2015  DOB: [birth year]  Weekly Radiation Therapy Management    ICD-9-CM ICD-10-CM   1. Carcinoma of lower-inner quadrant of right breast 174.3 C50.311     Current Dose: 34.2 Gy     Planned Dose:  62.4 Gy  Narrative  She reports pain under her breast with activity. She reports fatigue.  She reports her right breast has been itching. Recommended using 1% hydrocortisone cream to the itching areas after treatment.t.                                                                  Set-up films were reviewed.                                 The chart was checked. Physical Findings. . .  height is 5\' 8"  (1.727 m) and weight is 276 lb 14.4 oz (125.601 kg). Her oral temperature is 97.7 F (36.5 C). Her blood pressure is 139/72 and her pulse is 87. Her respiration is 16. .  The skin on her right breast and underarm area is red. She has a scattered red rash in her upper central right chest area. She has 2 blisters under her right breast. She has been putting neosporin on them. Impression . . . . . . . The patient is tolerating radiation. Plan . . . . . . . . . . . . Continue treatment as planned.   She is using biafine cream as well. She will be given hydrogel pads to put under her right chest.   ________________________________   Blair Promise, PhD, MD

## 2015-02-13 ENCOUNTER — Ambulatory Visit
Admission: RE | Admit: 2015-02-13 | Discharge: 2015-02-13 | Disposition: A | Discharge status: Home / Self Care | Payer: BC Managed Care – PPO | Source: Ambulatory Visit | Attending: Radiation Oncology | Admitting: Radiation Oncology

## 2015-02-13 DIAGNOSIS — Z510 Encounter for antineoplastic radiation therapy: Secondary | ICD-10-CM | POA: Diagnosis not present

## 2015-02-14 ENCOUNTER — Other Ambulatory Visit: Payer: Self-pay | Admitting: Nurse Practitioner

## 2015-02-14 ENCOUNTER — Ambulatory Visit
Admission: RE | Admit: 2015-02-14 | Discharge: 2015-02-14 | Disposition: A | Discharge status: Home / Self Care | Payer: BC Managed Care – PPO | Source: Ambulatory Visit | Attending: Radiation Oncology | Admitting: Radiation Oncology

## 2015-02-14 DIAGNOSIS — Z510 Encounter for antineoplastic radiation therapy: Secondary | ICD-10-CM | POA: Diagnosis not present

## 2015-02-15 ENCOUNTER — Ambulatory Visit
Admission: RE | Admit: 2015-02-15 | Discharge: 2015-02-15 | Disposition: A | Discharge status: Home / Self Care | Payer: BC Managed Care – PPO | Source: Ambulatory Visit | Attending: Radiation Oncology | Admitting: Radiation Oncology

## 2015-02-15 DIAGNOSIS — Z510 Encounter for antineoplastic radiation therapy: Secondary | ICD-10-CM | POA: Diagnosis not present

## 2015-02-19 ENCOUNTER — Encounter: Payer: Self-pay | Admitting: Radiation Oncology

## 2015-02-19 ENCOUNTER — Ambulatory Visit
Admission: RE | Admit: 2015-02-19 | Discharge: 2015-02-19 | Disposition: A | Discharge status: Home / Self Care | Payer: BC Managed Care – PPO | Source: Ambulatory Visit | Attending: Radiation Oncology | Admitting: Radiation Oncology

## 2015-02-19 VITALS — BP 152/100 | HR 97 | Temp 98.1°F | Resp 16 | Ht 68.0 in | Wt 273.6 lb

## 2015-02-19 DIAGNOSIS — C50311 Malignant neoplasm of lower-inner quadrant of right female breast: Secondary | ICD-10-CM | POA: Diagnosis present

## 2015-02-19 DIAGNOSIS — Z510 Encounter for antineoplastic radiation therapy: Secondary | ICD-10-CM | POA: Diagnosis not present

## 2015-02-19 MED ORDER — BIAFINE EX EMUL
Freq: Once | CUTANEOUS | Status: AC
  Administered 2015-02-19: 11:00:00 via TOPICAL

## 2015-02-19 NOTE — Addendum Note (Signed)
Encounter addended by: [name], MD on: 02/19/2015  2:07 PM<BR>     Documentation filed: Notes Section

## 2015-02-19 NOTE — Progress Notes (Addendum)
Teresa Aguilar has completed 23 fractions to her right breast.  She reports pain at a 2/10 in her right breast and increases with movement.  She reports fatigue.  The skin on her right breast is red with a small area of peeling under her right arm.  She has moist desquamation under her right breast.  She is using biafine and is requesting a refill.  She is also using neosporin and hydrogel pads on the open areas.    BP 152/100 mmHg  Pulse 97  Temp(Src) 98.1 F (36.7 C) (Oral)  Resp 16  Ht 5\' 8"  (1.727 m)  Wt 273 lb 9.6 oz (124.104 kg)  BMI 41.61 kg/m2

## 2015-02-19 NOTE — Progress Notes (Signed)
  Radiation Oncology         [phone] ________________________________  Name: Teresa Aguilar MRN: [id number]  Date: 02/19/2015  DOB: [birth year]  Simulation  Note electron therapy    ICD-9-CM ICD-10-CM   1. Carcinoma of lower-inner quadrant of right breast 174.3 C50.311 topical emolient (BIAFINE) emulsion    Status: outpatient  NARRATIVE: Earlier today the patient underwent additional planning for radiation therapy directed at the right breast. The patient's treatment planning CT scan was reviewed and she had set up of a custom electron cutout field directed at the lumpectomy cavity within the lower inner quadrant of the right breast. Patient will be treated with 18 MeV electrons. [employer] isodose plan was generated for treatment.  Plan the patient received 6 additional treatments at 2 Gy per day for a boost dose of 12 gray and a cumulative dose of 62.4 gray -----------------------------------  Blair Promise, PhD, MD

## 2015-02-19 NOTE — Progress Notes (Signed)
  Radiation Oncology         [phone] ________________________________  Name: Teresa Aguilar MRN: [id number]  Date: 02/19/2015  DOB: [birth year]  Weekly Radiation Therapy Management    ICD-9-CM ICD-10-CM   1. Carcinoma of lower-inner quadrant of right breast 174.3 C50.311 topical emolient (BIAFINE) emulsion    Current Dose: 41.4 Gy     Planned Dose:  62.4 Gy  Narrative . . . . . . . . The patient presents for routine under treatment assessment.                                   The patient is having discomfort in the breast. She taking meloxicam given to her by her orthopedic surgeon which is helpful.                                 Set-up films were reviewed.                                 The chart was checked. Physical Findings. . .  height is 5\' 8"  (1.727 m) and weight is 273 lb 9.6 oz (124.104 kg). Her oral temperature is 98.1 F (36.7 C). Her blood pressure is 152/100 and her pulse is 97. Her respiration is 16. . The lungs are clear. The heart has a regular rhythm and rate. The right breast area shows diffuse erythema. Minimal moist desquamation in the inframammary fold. No signs of infection Impression . . . . . . . The patient is tolerating radiation. Plan . . . . . . . . . . . . Continue treatment as planned. She will continue with Hydro gel pads underneath the right breast as well as neosporin. Biafine for other parts of the breast.  ________________________________   Blair Promise, PhD, MD

## 2015-02-20 ENCOUNTER — Ambulatory Visit
Admission: RE | Admit: 2015-02-20 | Discharge: 2015-02-20 | Disposition: A | Discharge status: Home / Self Care | Payer: BC Managed Care – PPO | Source: Ambulatory Visit | Attending: Radiation Oncology | Admitting: Radiation Oncology

## 2015-02-20 ENCOUNTER — Ambulatory Visit: Payer: Self-pay | Admitting: Oncology

## 2015-02-20 DIAGNOSIS — Z510 Encounter for antineoplastic radiation therapy: Secondary | ICD-10-CM | POA: Diagnosis not present

## 2015-02-21 ENCOUNTER — Ambulatory Visit
Admission: RE | Admit: 2015-02-21 | Discharge: 2015-02-21 | Disposition: A | Discharge status: Home / Self Care | Payer: BC Managed Care – PPO | Source: Ambulatory Visit | Attending: Radiation Oncology | Admitting: Radiation Oncology

## 2015-02-21 DIAGNOSIS — Z510 Encounter for antineoplastic radiation therapy: Secondary | ICD-10-CM | POA: Diagnosis not present

## 2015-02-22 ENCOUNTER — Ambulatory Visit
Admission: RE | Admit: 2015-02-22 | Discharge: 2015-02-22 | Disposition: A | Discharge status: Home / Self Care | Payer: BC Managed Care – PPO | Source: Ambulatory Visit | Attending: Radiation Oncology | Admitting: Radiation Oncology

## 2015-02-22 DIAGNOSIS — Z510 Encounter for antineoplastic radiation therapy: Secondary | ICD-10-CM | POA: Diagnosis not present

## 2015-02-25 ENCOUNTER — Ambulatory Visit
Admission: RE | Admit: 2015-02-25 | Discharge: 2015-02-25 | Disposition: A | Discharge status: Home / Self Care | Payer: BC Managed Care – PPO | Source: Ambulatory Visit | Attending: Radiation Oncology | Admitting: Radiation Oncology

## 2015-02-25 DIAGNOSIS — Z510 Encounter for antineoplastic radiation therapy: Secondary | ICD-10-CM | POA: Diagnosis not present

## 2015-02-26 ENCOUNTER — Ambulatory Visit
Admission: RE | Admit: 2015-02-26 | Discharge: 2015-02-26 | Disposition: A | Discharge status: Home / Self Care | Payer: BC Managed Care – PPO | Source: Ambulatory Visit | Attending: Radiation Oncology | Admitting: Radiation Oncology

## 2015-02-26 ENCOUNTER — Encounter: Payer: Self-pay | Admitting: Radiation Oncology

## 2015-02-26 VITALS — BP 135/58 | HR 98 | Temp 98.2°F | Ht 68.0 in | Wt 270.9 lb

## 2015-02-26 DIAGNOSIS — C50311 Malignant neoplasm of lower-inner quadrant of right female breast: Secondary | ICD-10-CM

## 2015-02-26 DIAGNOSIS — Z510 Encounter for antineoplastic radiation therapy: Secondary | ICD-10-CM | POA: Diagnosis not present

## 2015-02-26 MED ORDER — HYDROCODONE-ACETAMINOPHEN 10-325 MG PO TABS: 1.0000 | ORAL_TABLET | Freq: Four times a day (QID) | ORAL | Status: DC | PRN

## 2015-02-26 NOTE — Progress Notes (Signed)
  Radiation Oncology         [phone] ________________________________  Name: Teresa Aguilar MRN: [id number]  Date: 02/26/2015  DOB: 1961/02/10  Weekly Radiation Therapy Management    ICD-9-CM ICD-10-CM   1. Carcinoma of lower-inner quadrant of right breast 174.3 C50.311     Current Dose: 50.4 Gy     Planned Dose:  62.4 Gy  Narrative . . . . . . . . The patient presents for routine under treatment assessment.                                     She reports pain in right breast at a 10/10. She reports fatigue. Her right breast is red with moist desquamation under her breast. She also has dry desquamtion under her right arm. She is using biafine cream and neosporin under her breast and underarm areas.                                   Set-up films were reviewed.                                 The chart was checked. Physical Findings. . .  height is 5\' 8"  (1.727 m) and weight is 270 lb 14.4 oz (122.879 kg). Her oral temperature is 98.2 F (36.8 C). Her blood pressure is 135/58 and her pulse is 98. Her oxygen saturation is 99%. . No signs of infection within the breast. She has moist desquamation in the inframammary fold.  Impression . . . . . . . The patient is tolerating radiation. Plan . . . . . . . . . . . . Continue treatment as planned. Prescription for hydrocodone. She will take her nausea medicine 1-2 hours prior to this. She also would like to try gentian violet and this was placed in the inframammary fold area.  ________________________________   Blair Promise, PhD, MD

## 2015-02-26 NOTE — Progress Notes (Signed)
Teresa Aguilar has completed 28 fractions to her right breast.  She reports pain in right breast at a 10/10.  She reports fatigue.  Her right breast is red with moist desquamation under her breast.  She also has dry desquamtion under her right arm.  She is using biafine cream and neosporin under her breast and underarm areas.    BP 135/58 mmHg  Pulse 98  Temp(Src) 98.2 F (36.8 C) (Oral)  Ht 5\' 8"  (1.727 m)  Wt 270 lb 14.4 oz (122.879 kg)  BMI 41.20 kg/m2  SpO2 99%

## 2015-02-26 NOTE — Progress Notes (Signed)
Painted Gentian Violet under Tascha's right arm and breast.  Deniz was given telfa pads to cover the areas.

## 2015-02-26 NOTE — Addendum Note (Signed)
Encounter addended by: Jacqulyn Liner, RN on: 02/26/2015  1:53 PM<BR>     Documentation filed: Notes Section

## 2015-02-26 NOTE — Progress Notes (Signed)
  Radiation Oncology         [phone] ________________________________  Name: Teresa Aguilar MRN: [id number]  Date: 02/26/2015  DOB: 08/29/1960  Simulation Verification Note- Electrons    ICD-9-CM ICD-10-CM   1. Carcinoma of lower-inner quadrant of right breast 174.3 C50.311     Status: outpatient  NARRATIVE: The patient was brought to the treatment unit and placed in the planned treatment position. The clinical setup was verified. Then port films were obtained and uploaded to the radiation oncology medical record software.  The treatment beams were carefully compared against the planned radiation fields. The position location and shape of the radiation fields was reviewed. They targeted volume of tissue appears to be appropriately covered by the radiation beams. Organs at risk appear to be excluded as planned.  Based on my personal review, I approved the simulation verification. The patient's treatment will proceed as planned.  -----------------------------------  Blair Promise, PhD, MD

## 2015-02-27 ENCOUNTER — Ambulatory Visit: Payer: BC Managed Care – PPO

## 2015-02-27 DIAGNOSIS — Z510 Encounter for antineoplastic radiation therapy: Secondary | ICD-10-CM | POA: Diagnosis not present

## 2015-02-28 ENCOUNTER — Ambulatory Visit: Payer: BC Managed Care – PPO

## 2015-02-28 DIAGNOSIS — Z510 Encounter for antineoplastic radiation therapy: Secondary | ICD-10-CM | POA: Diagnosis not present

## 2015-03-01 ENCOUNTER — Ambulatory Visit: Payer: BC Managed Care – PPO

## 2015-03-01 ENCOUNTER — Ambulatory Visit
Admission: RE | Admit: 2015-03-01 | Discharge: 2015-03-01 | Disposition: A | Discharge status: Home / Self Care | Payer: BC Managed Care – PPO | Source: Ambulatory Visit | Attending: Radiation Oncology | Admitting: Radiation Oncology

## 2015-03-01 DIAGNOSIS — Z510 Encounter for antineoplastic radiation therapy: Secondary | ICD-10-CM | POA: Diagnosis not present

## 2015-03-01 NOTE — Progress Notes (Signed)
Patient reported that the Gentian Violet helped with her pain.  Applied gentian violet to patient's right underarm area and under her right breast.  Both areas have desquamation.

## 2015-03-04 ENCOUNTER — Ambulatory Visit
Admission: RE | Admit: 2015-03-04 | Discharge: 2015-03-04 | Disposition: A | Discharge status: Home / Self Care | Payer: BC Managed Care – PPO | Source: Ambulatory Visit | Attending: Radiation Oncology | Admitting: Radiation Oncology

## 2015-03-04 DIAGNOSIS — Z510 Encounter for antineoplastic radiation therapy: Secondary | ICD-10-CM | POA: Diagnosis not present

## 2015-03-05 ENCOUNTER — Encounter: Payer: Self-pay | Admitting: Radiation Oncology

## 2015-03-05 ENCOUNTER — Ambulatory Visit
Admission: RE | Admit: 2015-03-05 | Discharge: 2015-03-05 | Disposition: A | Discharge status: Home / Self Care | Payer: BC Managed Care – PPO | Source: Ambulatory Visit | Attending: Radiation Oncology | Admitting: Radiation Oncology

## 2015-03-05 VITALS — BP 133/106 | HR 88 | Temp 98.1°F | Ht 68.0 in | Wt 272.7 lb

## 2015-03-05 DIAGNOSIS — C50311 Malignant neoplasm of lower-inner quadrant of right female breast: Secondary | ICD-10-CM | POA: Insufficient documentation

## 2015-03-05 DIAGNOSIS — Z510 Encounter for antineoplastic radiation therapy: Secondary | ICD-10-CM | POA: Diagnosis not present

## 2015-03-05 MED ORDER — BIAFINE EX EMUL
Freq: Once | CUTANEOUS | Status: AC
Start: 2015-03-05 — End: 2015-03-05
  Administered 2015-03-05: 11:00:00 via TOPICAL

## 2015-03-05 NOTE — Progress Notes (Addendum)
Teresa Aguilar has completed 32 fractions to her right breast.  She reports pain at a 2/10 in her right breast.  She is using hydrocodone/acetaminophen at night.  She is using biafine and neosporin on her right breast.  She has been given a refill of biafine today.  The area under her right arm is healing.  She continues to have desquamation under her right breast.  She was painted with Gentian Violet yesterday.  BP 133/106 mmHg  Pulse 88  Temp(Src) 98.1 F (36.7 C) (Oral)  Ht 5\' 8"  (1.727 m)  Wt 272 lb 11.2 oz (123.696 kg)  BMI 41.47 kg/m2

## 2015-03-05 NOTE — Progress Notes (Signed)
  Radiation Oncology         [phone] ________________________________  Name: Teresa Aguilar MRN: [id number]  Date: 03/05/2015  DOB: 1960-10-19  Weekly Radiation Therapy Management  Carcinoma of lower-inner quadrant of right breast - Plan: topical emolient (BIAFINE) emulsion   Current Dose: 60.4 Gy     Planned Dose:  62.4 Gy  Narrative . . . . . . . . The patient presents for routine under treatment assessment.                                   She reports pain at a 2/10 in her right breast. She is using hydrocodone/acetaminophen at night. She is using biafine and neosporin on her right breast. She has been given a refill of biafine today. The area under her right arm is healing. She continues to have desquamation under her right breast. She was painted with Gentian Violet yesterday.                                 Set-up films were reviewed.                                 The chart was checked. Physical Findings. . .  height is 5\' 8"  (1.727 m) and weight is 272 lb 11.2 oz (123.696 kg). Her oral temperature is 98.1 F (36.7 C). Her blood pressure is 133/106 and her pulse is 88.   tHe area of concern in the inframammary fold is healing and drying up with use of gentian violet. Impression . . . . . . . The patient is tolerating radiation. Plan . . . . . . . . . . . . Continue treatment as planned.  ________________________________   Blair Promise, PhD, MD

## 2015-03-06 ENCOUNTER — Encounter: Payer: Self-pay | Admitting: Radiation Oncology

## 2015-03-06 ENCOUNTER — Ambulatory Visit: Payer: BC Managed Care – PPO

## 2015-03-06 ENCOUNTER — Ambulatory Visit
Admission: RE | Admit: 2015-03-06 | Discharge: 2015-03-06 | Disposition: A | Discharge status: Home / Self Care | Payer: BC Managed Care – PPO | Source: Ambulatory Visit | Attending: Radiation Oncology | Admitting: Radiation Oncology

## 2015-03-06 DIAGNOSIS — Z510 Encounter for antineoplastic radiation therapy: Secondary | ICD-10-CM | POA: Diagnosis not present

## 2015-03-06 NOTE — Progress Notes (Signed)
Painted under Emony's right breast with Gentian Violet.  Covered with telfa.  Advised her to call with any issues or concerns.

## 2015-03-07 ENCOUNTER — Ambulatory Visit: Payer: BC Managed Care – PPO

## 2015-03-08 ENCOUNTER — Ambulatory Visit: Payer: BC Managed Care – PPO

## 2015-03-11 ENCOUNTER — Telehealth: Payer: Self-pay | Admitting: *Deleted

## 2015-03-11 ENCOUNTER — Ambulatory Visit (HOSPITAL_BASED_OUTPATIENT_CLINIC_OR_DEPARTMENT_OTHER): Payer: BC Managed Care – PPO | Admitting: Oncology

## 2015-03-11 VITALS — BP 124/71 | HR 107 | Temp 97.5°F | Resp 20 | Ht 68.0 in | Wt 269.5 lb

## 2015-03-11 DIAGNOSIS — C50311 Malignant neoplasm of lower-inner quadrant of right female breast: Secondary | ICD-10-CM

## 2015-03-11 MED ORDER — ANASTROZOLE 1 MG PO TABS: 1.0000 mg | ORAL_TABLET | Freq: Every day | ORAL | Status: DC

## 2015-03-11 NOTE — Telephone Encounter (Signed)
Left vm for pt concerning completion of xrt and referral to survivorship.  Request return call. Contact information given.

## 2015-03-11 NOTE — Telephone Encounter (Signed)
Called pt to assess needs after xrt. Relate doing well. Has "some blisters under my breast". Relate Dr. Jana Hakim assessed that earlier today during her f/u appt. Discussed survivorship program and referral placed. Encourage pt to call with needs. Received verbal understanding.

## 2015-03-11 NOTE — Progress Notes (Signed)
Teresa Aguilar  [phone] [phone]     ID: [name] DOB: 03/16/1961  MR#: [id number]  PTW#:[id number]  Patient Care Team: Stephens Shire, MD as PCP - General (Family Medicine) PCP: Stephens Shire, MD GYN: [name] MD SU: [name] MD OTHER MD: [name] MD, Phylliss Bob MD, Jamie Kato MD  CHIEF COMPLAINT: Estrogen receptor positive breast cancer  CURRENT TREATMENT: Adjuvant chemotherapy   BREAST CANCER HISTORY: from the original intake note:  "Teresa Aguilar" had routine screening mammography January 2016 in Dr. Tonette Bihari office showing a possible change in the right breast. On 07/24/2014 at the breast Center she underwent right digital mammography and ultrasonography. The breast density was category A. In the lower inner quadrant of the right breast there was a 2 cm mass which was palpable by exam. Ultrasound confirmed a 1.5 cm irregular hypoechoic mass at the 3:30 o'clock position 12 cm from the nipple. There was no right axillary adenopathy.  Biopsy of the mass in question 07/24/2014 showed ([id number]) invasive ductal carcinoma, grade 2 or 3, estrogen receptor 98% positive, progesterone receptor 76% positive, both with strong staining intensity, with an MIB-1 of 66% and no HER-2 amplification by FISH.  The patient's case was discussed at the multidisciplinary breast cancer conference 08/08/2014 and it was felt the patient would benefit from breast conserving surgery and likely would need an Oncotype to decide on optimal systemic therapy. She would need radiation and hormones. The question of genetics counseling was also raised.  On 08/27/2014 the patient underwent right lumpectomy and sentinel lymph node sampling. The final pathology ([id number]) confirmed invasive ductal carcinoma, grade 3, measuring 1.9 cm. There was evidence of lymphovascular and perineural invasion. Margins were negative but close, the closest margin for the  in situ component being 1 mm. The single sentinel lymph node was negative. Repeat HER-2 was again negative, with a signals ratio of 0.97 and a number per cell of 1.60.  The patient's subsequent history is as detailed below  INTERVAL HISTORY: Teresa Aguilar returns today for follow up of her breast cancer. She completed her radiation treatments last week. She generally did well at first, but then became progressively more tired and developed desquamation with the last weeks treatment. She is here to discuss anti-estrogens  REVIEW OF SYSTEMS: Teresa Aguilar  Has no energy. She has difficulty walking any distance because of the surgery in her right hip. The pain there is no worse than before and she has more walking problems. She has been told it is her piriformis muscle now that is the issue. She is not having any unusual headaches, visual changes, nausea, or vomiting. She denies cough, phlegm production, or pleurisy. She was just started on Norco, which he takes at night and she is slightly constipated from that. She is  Using Metamucil , with some success. Urine is not a problem. A detailed review of systems is otherwise stable  PAST MEDICAL HISTORY: Past Medical History  Diagnosis Date  . Hypoxia     after surgery  . DVT (deep venous thrombosis) 03/2014    R leg, post op- on Xarelto   . Hypertension     pt. reports that she has been higher in the past & again now but doesn't take any med.,never has for ^BP, pt. stating that BP is high now b/c of her pain in her back.    . H/O cardiovascular stress test 2012     test done for stress from her  Father dying, had chest pain ,pt. had a stress/echo  test, told it was wnl  . Anxiety     relative for to pain & frustration of prev. hospitalization   . Gestational diabetes 1986; 1996  . Migraines     "at least q other week" (08/27/2014)  . Arthritis     degenerative lumbar spine    . Chronic back pain   . Breast cancer of lower-inner quadrant of right female  breast   . Colon cancer   . Breast cancer 2016  . Family history of breast cancer   . Family history of colon cancer   . PONV (postoperative nausea and vomiting)     low O2 sats, < 50% post op  . Family history of adverse reaction to anesthesia     PONV  . GERD (gastroesophageal reflux disease)     used during chemo, has not taken recently    PAST SURGICAL HISTORY: Past Surgical History  Procedure Laterality Date  . Endometrial ablation  ~ 1998  . Colonoscopy w/ polypectomy  2015    found tubulovillous adenoma with focal high grade displasia  . Plantar fascia release Right ~ 2000  . Eye muscle surgery Bilateral ~ 1965    to fix cross-eye as child  . Sacroiliac joint fusion Right 02/22/2014    Procedure: SACROILIAC JOINT FUSION;  Surgeon: Sinclair Ship, MD;  Location: [place];  Service: Orthopedics;  Laterality: Right;  Right sided sacroiliac joint fusion  . Radiofrequency ablation nerves      x 2 to nerves in her lower back  . Dental surgery  ~ 2011    "replaced bone graft upper jaw; put 2 implants in"  . Tubal ligation  1996  . Axillary sentinel node biopsy Right 08/27/2014    Archie Endo 08/27/2014  . Tonsillectomy  1988  . Excisional hemorrhoidectomy  2015  . Back surgery    . Breast biopsy Right 07/24/2014    core biopsy  . Breast lumpectomy Right 08/27/2014    Archie Endo 08/27/2014  . Radioactive seed guided mastectomy with axillary sentinel lymph node biopsy Right 08/27/2014    Procedure: RADIOACTIVE SEED GUIDED RIGHT BREAST LUMPECTOMY WITH RIGHT AXILLARY SENTINEL LYMPH NODE BIOPSY;  Surgeon: [name], MD;  Location: [place];  Service: General;  Laterality: Right;  . Portacath placement N/A 09/19/2014    Procedure: INSERTION PORT-A-CATH;  Surgeon: [name], MD;  Location: [place];  Service: General;  Laterality: N/A;  . Port-a-cath removal Right 12/31/2014    Procedure: REMOVAL PORT-A-CATH;  Surgeon: [name], MD;  Location: [place];  Service: General;   Laterality: Right;    FAMILY HISTORY Family History  Problem Relation Age of Onset  . Breast cancer Cousin 18    maternal first cousin  . Colon cancer Maternal Aunt 75  . Throat cancer Maternal Aunt 75    smoker  . Breast cancer Paternal Aunt 47  . Heart disease Mother   . Heart disease Father   . Diabetes type II Father   . Stomach cancer Maternal Uncle     dx in his 16s  . Colon cancer Cousin 41     maternal first cousin  . Prostate cancer Cousin 42    paternal first cousin  . Breast cancer Paternal Aunt     dx in her 31s  . Breast cancer Other     mother's paternal first cousin  . Colon cancer Other 44    MGF's sister  . Brain  cancer Other 72    MGFs sister  . Breast cancer Other     MGF's paternal aunt  . Prostate cancer Other     MGF's paternal first cousin   the patient's father died at the age of 19 from heart failure in the setting of diabetes. The patient's mother is currently 73 years old. The patient has 3 brothers, 2 sisters. There is no history of breast, ovarian, or colon cancer in first-degree relatives. However, on the mother's side the patient has 3 relatives with breast cancer (a cousin diagnosed age 55, a second cousin diagnosed age 45, and a great aunt). There is also a history of colon cancer diagnosed age around age 37 in a great aunt and small intestinal cancer in an aunt diagnosed age 43. On the father's side there are 2 cousins with breast cancer diagnosed age 58 and 54, and a cousin with colon cancer diagnosed age 36  GYNECOLOGIC HISTORY:  No LMP recorded. Patient has had an ablation. Menarche age 56, the patient carried 2 children to term, the first at age 43. After the first live birth she had a premature girl who died after one day (she had been a breech birth) and she also had 3 miscarriages. The patient underwent endometrial ablation in 1998 and has had no periods since that time.  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 Her husband Braulio Conte works for [employer] in the [place] and Manufacturing systems engineer. Son Franceen Erisman lives in [place] and works in Education officer, community. He was an Gaffer) and son Aleen Marston is 19 and works for [employer]. He also lives in [place]. The patient has one granddaughter. The patient is a Psychologist, forensic    ADVANCED DIRECTIVES: Not in place   HEALTH MAINTENANCE: Social History  Substance Use Topics  . Smoking status: Never Smoker   . Smokeless tobacco: Never Used  . Alcohol Use: No     Colonoscopy: June 2015/May and last repeat planned 10/03/2014  PAP: 2015  Bone density:  Lipid panel:  Allergies  Allergen Reactions  . Morphine And Related Nausea And Vomiting  . Sulfa Antibiotics Hives    Current Outpatient Prescriptions  Medication Sig Dispense Refill  . eletriptan (RELPAX) 40 MG tablet Take 40 mg by mouth as needed for migraine or headache. One tablet by mouth at onset of headache. May repeat in 2 hours if headache persists or recurs.    Marland Kitchen emollient (BIAFINE) cream Apply topically as needed.    . hyaluronate sodium (RADIAPLEXRX) GEL Apply 1 application topically 2 (two) times daily.    Marland Kitchen HYDROcodone-acetaminophen (NORCO) 10-325 MG per tablet Take 1 tablet by mouth every 6 (six) hours as needed. 30 tablet 0  . ibuprofen (ADVIL,MOTRIN) 200 MG tablet Take 200 mg by mouth every 6 (six) hours as needed for mild pain or moderate pain.    Marland Kitchen ketoconazole (NIZORAL) 2 % shampoo Apply 1 application topically 2 (two) times a week. (Patient not taking: Reported on 02/26/2015) 120 mL 0  . meloxicam (MOBIC) 15 MG tablet Take 15 mg by mouth daily.    . methocarbamol (ROBAXIN) 750 MG tablet Take 750 mg by mouth every 6 (six) hours as needed for muscle spasms.    . non-metallic deodorant Jethro Poling) MISC Apply 1 application topically daily as needed.    Marland Kitchen omeprazole (PRILOSEC) 40 MG capsule Take 1 capsule (40 mg total)  by mouth daily. (Patient  not taking: Reported on 12/19/2014) 30 capsule 2   No current facility-administered medications for this visit.    OBJECTIVE: Middle-aged white woman walking with a slight limp Filed Vitals:   03/11/15 1153  BP: 124/71  Pulse: 107  Temp: 97.5 F (36.4 C)  Resp: 20     Body mass index is 40.99 kg/(m^2).    ECOG FS:2 - Symptomatic, <50% confined to bed Filed Weights   03/11/15 1153  Weight: 269 lb 8 oz (122.244 kg)   Hair is coming in [place] with some dark roots Sclerae unicteric, pupils round and equal Oropharynx clear and moist-- no thrush or other lesions No cervical or supraclavicular adenopathy Lungs no rales or rhonchi Heart regular rate and rhythm Abd soft, obese,nontender, positive bowel sounds MSK no focal spinal tenderness, no upper extremity lymphedema Neuro: nonfocal, well oriented, appropriate affect Breasts: there is significant erythema particularly over the inferior inner quadrant where she had the "boost". There is desquamation there. The breast is tender to palpation. The right axilla is benign.The left breast is unremarkable   LAB RESULTS:  CMP     Component Value Date/Time   NA 141 12/27/2014 1315   NA 140 11/26/2014 0828   K 3.8 12/27/2014 1315   K 3.9 11/26/2014 0828   CL 109 12/27/2014 1315   CO2 24 12/27/2014 1315   CO2 18* 11/26/2014 0828   GLUCOSE 138* 12/27/2014 1315   GLUCOSE 321* 11/26/2014 0828   BUN 12 12/27/2014 1315   BUN 11.7 11/26/2014 0828   CREATININE 0.77 12/27/2014 1315   CREATININE 0.8 11/26/2014 0828   CALCIUM 9.2 12/27/2014 1315   CALCIUM 9.1 11/26/2014 0828   PROT 6.7 11/26/2014 0828   PROT 6.4 09/26/2014 1406   ALBUMIN 3.6 11/26/2014 0828   ALBUMIN 3.9 09/26/2014 1406   AST 11 11/26/2014 0828   AST 15 09/26/2014 1406   ALT 18 11/26/2014 0828   ALT 13 09/26/2014 1406   ALKPHOS 80 11/26/2014 0828   ALKPHOS 83 09/26/2014 1406   BILITOT 0.53 11/26/2014 0828   BILITOT 0.7 09/26/2014 1406    GFRNONAA >60 12/27/2014 1315   GFRAA >60 12/27/2014 1315    INo results found for: SPEP, UPEP  Lab Results  Component Value Date   WBC 5.7 12/27/2014   NEUTROABS 3.9 12/27/2014   HGB 12.2 12/27/2014   HCT 37.8 12/27/2014   MCV 95.7 12/27/2014   PLT 266 12/27/2014      Chemistry      Component Value Date/Time   NA 141 12/27/2014 1315   NA 140 11/26/2014 0828   K 3.8 12/27/2014 1315   K 3.9 11/26/2014 0828   CL 109 12/27/2014 1315   CO2 24 12/27/2014 1315   CO2 18* 11/26/2014 0828   BUN 12 12/27/2014 1315   BUN 11.7 11/26/2014 0828   CREATININE 0.77 12/27/2014 1315   CREATININE 0.8 11/26/2014 0828      Component Value Date/Time   CALCIUM 9.2 12/27/2014 1315   CALCIUM 9.1 11/26/2014 0828   ALKPHOS 80 11/26/2014 0828   ALKPHOS 83 09/26/2014 1406   AST 11 11/26/2014 0828   AST 15 09/26/2014 1406   ALT 18 11/26/2014 0828   ALT 13 09/26/2014 1406   BILITOT 0.53 11/26/2014 0828   BILITOT 0.7 09/26/2014 1406       No results found for: LABCA2  No components found for: LABCA125  No results for input(s): INR in the last 168 hours.  Urinalysis    Component Value Date/Time  COLORURINE AMBER* 02/21/2014 1540   APPEARANCEUR CLOUDY* 02/21/2014 1540   LABSPEC 1.029 02/21/2014 1540   PHURINE 5.0 02/21/2014 1540   GLUCOSEU NEGATIVE 02/21/2014 1540   HGBUR SMALL* 02/21/2014 1540   BILIRUBINUR SMALL* 02/21/2014 1540   KETONESUR 15* 02/21/2014 1540   PROTEINUR NEGATIVE 02/21/2014 1540   UROBILINOGEN 1.0 02/21/2014 1540   NITRITE POSITIVE* 02/21/2014 1540   LEUKOCYTESUR TRACE* 02/21/2014 1540    STUDIES: CLINICAL DATA: Postmenopausal. Diagnosed with breast cancer earlier this year.  EXAM: DUAL X-RAY ABSORPTIOMETRY (DXA) FOR BONE MINERAL DENSITY  FINDINGS: AP LUMBAR SPINE  Bone Mineral Density (BMD): 0.905 g/cm2  Young Adult T-Score: -1.3  Z-Score: -0.2  LEFT FEMUR NECK  Bone Mineral Density (BMD): 0.656 g/cm2  Young Adult T-Score:  -1.7  Z-Score: -0.7  ASSESSMENT: Patient's diagnostic category is LOW BONE MASS by WHO Criteria.  FRACTURE RISK: MODERATE  FRAX: Based on the [place] model, the 10 year probability of a major osteoporotic fracture is 6.2%. The 10 year probability of a hip fracture is 0.5%.  COMPARISON: None.   ASSESSMENT: 54 y.o. [place] woman status post right lumpectomy and sentinel lymph node sampling 08/27/2014 for a pT1c pN0, stage IA invasive ductal carcinoma, grade 3, estrogen and progesterone receptor positive, HER-2 negative, with an MIB-1 of 66%  (1) adjuvant chemotherapy with cyclophosphamide and docetaxel every 21 days 4, with onpro support, completed 11/26/2014  (2) adjuvant radiation  Completed 03/06/2015  (3) antiestrogen therapy for 10 years will follow radiation  (4) genetics panel negative for any mutations in the following genes: APC, ATM, AXIN2, BARD1, BMPR1A, BRCA1, BRCA2, BRIP1, CDH1, CDK4, CDKN2A, CHEK2, EPCAM, FANCC, MLH1, MSH2, MSH6, MUTYH, NBN, PALB2, PMS2, POLD1, POLE, PTEN, RAD51C, RAD51D, SCG5/GREM1, SMAD4, STK11, TP53, VHL, and XRCC2.   (5) R LE DVT documented 03/20/2014 following right sacroiliac joint effusion 02/22/2014  (a) hypercoagulable panel normal  (b) received Rivaroxaban for 6 months, completed April 2016   (6) obesity:a nutrition consult has been requested  (7) anastrozole: to start 04/16/2015  (a) bone density scan08/04/2015 shows osteopenia with a T score of -1.7.    PLAN: Teresa Aguilar has completed her radiation and in general her local treatment and she is now ready to start anti-estrogens.  She understands that anti-estrogens will reduce the risk of breast cancer in half. This means the already very low risk of local recurrence will be further cut in half, the risk of a new breast cancer developing in either breast, which is at baseline about 1% per year, will be cut in half, and her risk of this cancer recurring  outside the breast will also be cutting half.  She wanted to know what I thought her risk of recurrence was and I would think if she can take anastrozole for 5 years her risk of recurrence will be less than 10%, in the 8% range.  It would not be unreasonable for her to receive anastrozole for 10 yearsperiod however we will send a special test of 5 years to decide whether her risk warrants that extra treatment. In many patients the benefit is very small  We did discuss the possible toxicities, side effects and complications of anastrozole. (She is not a good candidate for tamoxifen because of a history of clots). I suggested she not started until November 1, by which time I hope she will have recovered sufficiently from her chemotherapy and radiation.  She does need to proceed to colonoscopy and I gave her clearance for that.  I'm going  to see her again in [place] before that visit I'm going to check an [facility] and estradiol although clinically she is almost certainly postmenopausal. Chauncey Cruel, MD   03/11/2015 12:11 PM

## 2015-03-12 ENCOUNTER — Telehealth: Payer: Self-pay | Admitting: Oncology

## 2015-03-12 NOTE — Telephone Encounter (Signed)
Called patient and she is aware of her survivorship appointment

## 2015-03-15 ENCOUNTER — Telehealth: Payer: Self-pay | Admitting: *Deleted

## 2015-03-15 NOTE — Telephone Encounter (Signed)
Call from patient reporting cost of Anastrozole at [facility] will cost almost $500.00.  Dr. Jana Hakim told me to call if cost more than $15.00.  This nurse suggested [place] or [place].  Teresa Aguilar.  Awaiting call once [facility] investigates further.  Teresa Aguilar notified we will call with our Aguilar information on status of medication.  She may be in 'doughnut hole'.  Reports she doesn't need to start anastrozole until November 1st as she just completed RT last week.

## 2015-03-15 NOTE — Telephone Encounter (Signed)
FYI for Dr. Quintella Baton called reporting Anastrozole cost = $36.00.  Aaron Edelman contacted [facility], learned they ran it through as incorrect insurance of worker's comp.  [facility] now has correct price with correct insurance.  Called Ms. Nanney and notified her of this information.

## 2015-03-17 NOTE — Progress Notes (Signed)
  Radiation Oncology         [phone] ________________________________  Name: Teresa Aguilar MRN: [id number]  Date: 03/06/2015  DOB: [birth year]  End of Treatment Note  DIAGNOSIS: Teresa Aguilar is a 54 year old female presenting to clinic in regards to her estrogen receptor positive breast cancer. pT1c pN0, stage IA invasive ductal carcinoma of the right breast, grade 3    ICD-9-CM ICD-10-CM   1. Carcinoma of lower-inner quadrant of right breast 174.3 C50.311       Indication for treatment:  Breast conservation therapy       Radiation treatment dates:   01/17/2015-03/06/2015  Site/dose:   Right breast 50.4 gray in 28 fractions, lumpectomy cavity boost 12 gray in 6 fractions  Beams/energy:   Due to the location of the patient's lumpectomy cavity deep within the medial aspect of the right breast, the patient was treated with intensity modulated radiation therapy using a 5 field static approach. This treatment approach helped to reduce dose to the contralateral breast. Lumpectomy cavity boost was with a custom electron cutout field using 18 megavolaget electrons  Narrative: The patient tolerated radiation treatment relatively well.  Towards the end of her therapy she did develop was desquamation in the inframammary fold which responded well to gel pads. She did develop some fatigue and discomfort in light of these issues. gentian violet was also used as part of her therapy  Plan: The patient has completed radiation treatment. The patient will return to radiation oncology clinic for routine followup in one month. I advised them to call or return sooner if they have any questions or concerns related to their recovery or treatment.  -----------------------------------  Blair Promise, PhD, MD

## 2015-04-02 ENCOUNTER — Ambulatory Visit
Admission: RE | Admit: 2015-04-02 | Discharge: 2015-04-02 | Disposition: A | Discharge status: Home / Self Care | Payer: BC Managed Care – PPO | Source: Ambulatory Visit | Attending: Orthopedic Surgery | Admitting: Orthopedic Surgery

## 2015-04-02 ENCOUNTER — Other Ambulatory Visit: Payer: Self-pay | Admitting: Orthopedic Surgery

## 2015-04-02 DIAGNOSIS — M79604 Pain in right leg: Secondary | ICD-10-CM

## 2015-04-10 ENCOUNTER — Encounter: Payer: Self-pay | Admitting: Oncology

## 2015-04-11 ENCOUNTER — Ambulatory Visit (HOSPITAL_BASED_OUTPATIENT_CLINIC_OR_DEPARTMENT_OTHER): Payer: BC Managed Care – PPO | Admitting: Nurse Practitioner

## 2015-04-11 ENCOUNTER — Ambulatory Visit (HOSPITAL_COMMUNITY)
Admission: RE | Admit: 2015-04-11 | Discharge: 2015-04-11 | Disposition: A | Discharge status: Home / Self Care | Payer: BC Managed Care – PPO | Source: Ambulatory Visit | Attending: Radiation Oncology | Admitting: Radiation Oncology

## 2015-04-11 ENCOUNTER — Ambulatory Visit (HOSPITAL_BASED_OUTPATIENT_CLINIC_OR_DEPARTMENT_OTHER)
Admission: RE | Admit: 2015-04-11 | Discharge: 2015-04-11 | Disposition: A | Discharge status: Home / Self Care | Payer: BC Managed Care – PPO | Source: Ambulatory Visit | Attending: Nurse Practitioner | Admitting: Nurse Practitioner

## 2015-04-11 ENCOUNTER — Encounter: Payer: Self-pay | Admitting: Nurse Practitioner

## 2015-04-11 ENCOUNTER — Encounter: Payer: Self-pay | Admitting: Radiation Oncology

## 2015-04-11 ENCOUNTER — Telehealth: Payer: Self-pay | Admitting: *Deleted

## 2015-04-11 VITALS — BP 146/78 | HR 86 | Temp 97.7°F | Resp 18 | Ht 68.0 in | Wt 269.4 lb

## 2015-04-11 VITALS — BP 127/78 | HR 83 | Temp 97.8°F | Ht 68.0 in | Wt 269.6 lb

## 2015-04-11 DIAGNOSIS — M79621 Pain in right upper arm: Secondary | ICD-10-CM

## 2015-04-11 DIAGNOSIS — C50311 Malignant neoplasm of lower-inner quadrant of right female breast: Secondary | ICD-10-CM

## 2015-04-11 NOTE — Progress Notes (Signed)
CLINIC:  Cancer Survivorship   REASON FOR VISIT:  Routine follow-up post-treatment for a recent history of breast cancer.  BRIEF ONCOLOGIC HISTORY:    Carcinoma of lower-inner quadrant of right breast ([place])   07/12/2014 Mammogram Right breast: irregular 2 cm mass further evaluated with spot compression CC and MLO views.   07/12/2014 Breast US Right breast: 11 x 11 x 15 mm irregular taller than wide hypoechoic mass at 3:30 o'clock, 12 cm from the nipple. There is a surrounding peripheral rim of increased echogenicity. No right axillary lymphadenopathy   07/24/2014 Initial Biopsy Right breast core needle bx ([facility]): invasive ductal carcinoma, +LVI, grade 2-3, ER+ (98%), PR+ (76%), Ki67 66%   07/24/2014 Clinical Stage Stage IA: T1c N0   08/27/2014 Definitive Surgery Right lumpectomy/[facility] Donne Hazel): invasive ductal carcinoma, grade 3, +LVI, HER2/neu repeated and remains negative (ratio 0.97) 1 LN removed and negative for malignancy (0/1). DCIS,    08/27/2014 Pathologic Stage Stage IA: pT1c pN0   09/24/2014 - 11/26/2014 Chemotherapy Adjuvant cyclophosphamide and docetaxel q 21 days x 4 cycles   09/26/2014 Procedure Comprehensive Cancer Panel : neg APC, ATM, AXIN2, BARD1, BMPR1A, BRCA1, BRCA2, BRIP1, CDH1, CDK4, CDKN2A, CHEK2, EPCAM, FANCC, MLH1, MSH2, MSH6, MUTYH, NBN, PALB2, PMS2, POLD1, POLE, PTEN, RAD51C, RAD51D, SCG5/GREM1, SMAD4, STK11, TP53, VHL, and XRCC2.    01/17/2015 - 03/06/2015 Radiation Therapy Adjuvant RT ([facility]): Right breast 50.4 gray over 28 fractions, lumpectomy cavity boost 12 gray over 6 fractions. Total dose 62 Gy    Anti-estrogen oral therapy Anastrozole 1 mg daily to begin April 16, 2015.  Planned duration of therapy 10 years.    INTERVAL HISTORY:  Ms. Heslin presents to the [facility] today for our initial meeting to review her survivorship care plan detailing her treatment course for breast cancer, as well as monitoring long-term side effects of that treatment,  education regarding health maintenance, screening, and overall wellness and health promotion.     Overall, Ms. Folmer reports feeling good since completing her radiation last month.  She has recently been diagnosed with a lower right extremity DVT, approximately 5 days ago, for which she was placed on xarelto for anticoagulation.  She states that her leg pain is improved on the medication, however, she has noted right upper arm pain over the last few days.  She states that she is working with physical therapy, however, has not been doing right upper arm work during [employer].  She does share that she uses that arm more to aid getting up and down and in and out of the car, so it may be related to that.  She denies any swelling or numbness / tingling in either hand.  She reports some increased thickening in her right breast following radiation and she has noted some intermittent stinging pain following surgery.  This is self limited.  She has a history of migraines and states that she has had several in the last few weeks and is concerned that the anastrozole that she is scheduled to begin may make them worse.  She is scheduled to begin her anastrozole in November 2016. She denies any cough or weight change. Ms. Deal does have fatigue but is trying to exercise, although is limited by her right hip / leg pain (which predated her cancer diagnosis). She does have baseline hot flashes.  REVIEW OF SYSTEMS:  General: Denies fever, chills, or night sweats.  Cardiac: Denies palpitations, chest pain, and lower extremity edema.  Respiratory: Denies shortness of breath and dyspnea  on exertion.  GI: Intermittent constipation, although no worse at this time. Denies abdominal pain, diarrhea, nausea, or vomiting.  GU: Denies dysuria, hematuria, vaginal bleeding, vaginal discharge, or vaginal dryness.  Musculoskeletal: Right upper arm pain as above.  Improving lower right knee pain after beginning anticoagulation. Neuro:  Positive migraines. Denies recent falls. Denies peripheral neuropathy. Skin: Denies rash, pruritis, or open wounds.  Breast: Thickening in right breast post radiation. Otherwise, denies any new nodularity, masses, tenderness, nipple changes, or nipple discharge.  Psych: Denies depression, anxiety, insomnia, or memory loss.   A 14-point review of systems was completed and was negative, except as noted above.   ONCOLOGY TREATMENT TEAM:  1. Surgeon:  Dr. Donne [facility]el at [facility]  2. Medical Oncologist: Dr. Jana Hakim 3. Radiation Oncologist: Dr. Sondra Come    PAST MEDICAL/SURGICAL HISTORY:  Past Medical History  Diagnosis Date  . Hypoxia     after surgery  . DVT (deep venous thrombosis) ([place]) 03/2014    R leg, post op- on Xarelto   . Hypertension     pt. reports that she has been higher in the past & again now but doesn't take any med.,never has for ^BP, pt. stating that BP is high now b/c of her pain in her back.    . H/O cardiovascular stress test 2012     test done for stress from her Father dying, had chest pain ,pt. had a stress/echo  test, told it was wnl  . Anxiety     relative for to pain & frustration of prev. hospitalization   . Gestational diabetes 1986; 1996  . Migraines     "at least q other week" (08/27/2014)  . Arthritis     degenerative lumbar spine    . Chronic back pain   . Breast cancer of lower-inner quadrant of right female breast ([place])   . Colon cancer ([place])   . Breast cancer ([place]) 2016  . Family history of breast cancer   . Family history of colon cancer   . PONV (postoperative nausea and vomiting)     low O2 sats, < 50% post op  . Family history of adverse reaction to anesthesia     PONV  . GERD (gastroesophageal reflux disease)     used during chemo, has not taken recently  . Radiation 01/17/15-03/06/15    right breast 50.4 gray, lumpectomy cavity boost to 12 gray   Past Surgical History  Procedure Laterality Date  . Endometrial ablation  ~  1998  . Colonoscopy w/ polypectomy  2015    found tubulovillous adenoma with focal high grade displasia  . Plantar fascia release Right ~ 2000  . Eye muscle surgery Bilateral ~ 1965    to fix cross-eye as child  . Sacroiliac joint fusion Right 02/22/2014    Procedure: SACROILIAC JOINT FUSION;  Surgeon: Sinclair Ship, MD;  Location: [facility];  Service: Orthopedics;  Laterality: Right;  Right sided sacroiliac joint fusion  . Radiofrequency ablation nerves      x 2 to nerves in her lower back  . Dental surgery  ~ 2011    "replaced bone graft upper jaw; put 2 implants in"  . Tubal ligation  1996  . Axillary sentinel node biopsy Right 08/27/2014    Archie Endo 08/27/2014  . Tonsillectomy  1988  . Excisional hemorrhoidectomy  2015  . Back surgery    . Breast biopsy Right 07/24/2014    core biopsy  . Breast lumpectomy Right 08/27/2014    /  notes 08/27/2014  . Radioactive seed guided mastectomy with axillary sentinel lymph node biopsy Right 08/27/2014    Procedure: RADIOACTIVE SEED GUIDED RIGHT BREAST LUMPECTOMY WITH RIGHT AXILLARY SENTINEL LYMPH NODE BIOPSY;  Surgeon: [name], MD;  Location: [facility];  Service: General;  Laterality: Right;  . Portacath placement N/A 09/19/2014    Procedure: INSERTION PORT-A-CATH;  Surgeon: [name], MD;  Location: [facility];  Service: General;  Laterality: N/A;  . Port-a-cath removal Right 12/31/2014    Procedure: REMOVAL PORT-A-CATH;  Surgeon: [name], MD;  Location: [facility];  Service: General;  Laterality: Right;     ALLERGIES:  Allergies  Allergen Reactions  . Morphine And Related Nausea And Vomiting  . Sulfa Antibiotics Hives     CURRENT MEDICATIONS:  Current Outpatient Prescriptions on File Prior to Visit  Medication Sig Dispense Refill  . anastrozole (ARIMIDEX) 1 MG tablet Take 1 tablet (1 mg total) by mouth daily. 90 tablet 4  . eletriptan (RELPAX) 40 MG tablet Take 40 mg by mouth as needed for migraine or headache. One tablet by  mouth at onset of headache. May repeat in 2 hours if headache persists or recurs.    Marland Kitchen emollient (BIAFINE) cream Apply topically as needed.    . hyaluronate sodium (RADIAPLEXRX) GEL Apply 1 application topically 2 (two) times daily.    Marland Kitchen HYDROcodone-acetaminophen (NORCO) 10-325 MG per tablet Take 1 tablet by mouth every 6 (six) hours as needed. 30 tablet 0  . ibuprofen (ADVIL,MOTRIN) 200 MG tablet Take 200 mg by mouth every 6 (six) hours as needed for mild pain or moderate pain.    . meloxicam (MOBIC) 15 MG tablet Take 15 mg by mouth daily.    . methocarbamol (ROBAXIN) 750 MG tablet Take 750 mg by mouth every 6 (six) hours as needed for muscle spasms.    . non-metallic deodorant Jethro Poling) MISC Apply 1 application topically daily as needed.    Marland Kitchen omeprazole (PRILOSEC) 40 MG capsule Take 1 capsule (40 mg total) by mouth daily. (Patient not taking: Reported on 12/19/2014) 30 capsule 2   No current facility-administered medications on file prior to visit.     ONCOLOGIC FAMILY HISTORY:  Family History  Problem Relation Age of Onset  . Breast cancer Cousin 27    maternal first cousin  . Colon cancer Maternal Aunt 75  . Throat cancer Maternal Aunt 75    smoker  . Breast cancer Paternal Aunt 58  . Heart disease Mother   . Heart disease Father   . Diabetes type II Father   . Stomach cancer Maternal Uncle     dx in his 71s  . Colon cancer Cousin 28     maternal first cousin  . Prostate cancer Cousin 13    paternal first cousin  . Breast cancer Paternal Aunt     dx in her 62s  . Breast cancer Other     mother's paternal first cousin  . Colon cancer Other 48    MGF's sister  . Brain cancer Other 71    MGFs sister  . Breast cancer Other     MGF's paternal aunt  . Prostate cancer Other     MGF's paternal first cousin     GENETIC COUNSELING/TESTING: Yes, performed 09/26/2014: Comprehensive Cancer Panel: no clinically significant variant at APC, ATM, AXIN2, BARD1, BMPR1A, BRCA1, BRCA2,  BRIP1, CDH1, CDK4, CDKN2A, CHEK2, EPCAM, FANCC, MLH1, MSH2, MSH6, MUTYH, NBN, PALB2, PMS2, POLD1, POLE, PTEN, RAD51C, RAD51D, SCG5/GREM1, SMAD4, STK11, TP53,  VHL, and XRCC2.   SOCIAL HISTORY:  LINLEE CROMIE is married and lives with her family in [place], [place].  She has 2 children.  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  She denies any current or history of tobacco, alcohol, or illicit drug use.     PHYSICAL EXAMINATION:  Vital Signs:   Filed Vitals:   04/11/15 0955  BP: 163/86  Pulse: 86  Temp: 97.7 F (36.5 C)  Resp: 18   Repeat BP: 146/78 ECOG Performance Status: 1 General: Well-nourished, well-appearing female in no acute distress.  She is unaccompanied in clinic today.   HEENT: Wearing glasses. Head is atraumatic and normocephalic.  Pupils equal and reactive to light and accomodation. Conjunctivae clear without exudate.  Sclerae anicteric. Oral mucosa is pink, moist, and intact without lesions.  Oropharynx is pink without lesions or erythema.  Lymph: No cervical, supraclavicular, infraclavicular, or axillary lymphadenopathy noted on palpation.  Cardiovascular: Regular rate and rhythm without murmurs, rubs, or gallops. Respiratory: Clear to auscultation bilaterally. Chest expansion symmetric without accessory muscle use on inspiration or expiration.  GI: Abdomen soft and round. No tenderness to palpation. Bowel sounds normoactive in 4 quadrants. GU: Deferred.  Musculoskeletal: Muscle strength 5/5 in all extremities.  No visible swelling in right upper arm.  Pain to palpation along right shoulder and bicep.  Neuro: No focal deficits. Steady gait.  Psych: Mood and affect normal and appropriate for situation.  Extremities: No edema, cyanosis, or clubbing.  Skin: Warm and dry. No open lesions noted.   LABORATORY DATA:  None for this visit.  DIAGNOSTIC IMAGING:  None for this visit.     ASSESSMENT AND PLAN:   1. History of  breast cancer: Stage IA invasive ductal carcinoma of the right breast, S/P lumpectomy and adjuvant cyclophosphamide and docetaxel, followed by adjuvant radiation therapy to the right breast, now on adjuvant endocrine therapy with anastrozole.  Ms. Boyett is doing well with no clinical symptoms worrisome for disease recurrence.  She will follow-up with her medical oncologist,  Dr. Jana Hakim, in January 2017 with history and physical exam per surveillance protocol.  She will begin her anti-estrogen therapy with anastrozole in November as prescribed by  Dr. Jana Hakim. She was instructed to make Dr. Jana Hakim or myself aware if she begins to experience any side effects of the medication and I could see her back in clinic to help manage those side effects, as needed. A comprehensive survivorship care plan and treatment summary was reviewed with the patient today detailing her breast cancer diagnosis, treatment course, potential late/long-term effects of treatment, appropriate follow-up care with recommendations for the future, and patient education resources.  A copy of this summary, along with a letter will be sent to the patient's primary care provider via in basket message after today's visit.  Ms. Caradonna is welcome to return to the Survivorship Clinic in the future, as needed; no follow-up will be scheduled at this time.    2. Right upper arm pain: I am less suspicious of Ms. Belair's arm pain being related to a new clot as she is on anticoagulation at this time.  It sounds more musculoskeletal in nature, however, with her history it may be prudent to check an upper extremity doppler and consider hypercoagulability workup.  I will enter orders for this and have discussed this with Dr. Sondra Come, whom Ms. Lindor is seeing immediately following today's appointment.  I will also discuss her new lower extremity DVT with Dr. Jana Hakim  to see if he needs to see her sooner than January regarding her hypercoaguability.  She is on anticoagulation, so full labwork for any anticoagulation disorder will not be able to be performed at this time.  I have advised her that I believe it is safe for her to start her anastrozole next month.  Addendum: Following today's appointment, initial read on doppler was negative for new upper extremity clot.  Pt notified.  3. Bone health:  Given Ms. Mcdermid age/history of breast cancer and her current treatment regimen including anti-estrogen therapy with anastrozole, she is at risk for bone demineralization.  Per our records, her last DEXA scan was performed 01/24/2015 revealing osteopenia.  We will continue to monitor this every 2 years to ensure that the anastrozole isn't resulting in further bone loss.  In the meantime, she was encouraged to increase her consumption of foods rich in calcium and vitamin D as well as to increase her weight-bearing activities.  She was given education on specific activities to promote bone health.  4. Cancer screening:  Due to Ms. Serita Kyle history and her age, she should receive screening for skin cancers, colon cancer, and gynecologic cancers.  The information and recommendations are listed on the patient's comprehensive care plan/treatment summary and were reviewed in detail with the patient.    5. Health maintenance and wellness promotion: Ms. Shvartsman was encouraged to consume 5-7 servings of fruits and vegetables per day. We reviewed the "Nutrition Rainbow" handout, as well as the handout about "Nutrition for Breast Cancer Survivors."  She was also encouraged to engage in moderate to vigorous exercise for 30 minutes per day most days of the week. We discussed the [employer] fitness program, which is designed for cancer survivors to help them become more physically fit after cancer treatments.  She was instructed to continue to abstain from alcohol and tobacco use.   6. Support services/counseling: It is not uncommon for this period of the  patient's cancer care trajectory to be one of many emotions and stressors.  We discussed an opportunity for her to participate in the next session of [facility] ("Finding Your New Normal") support group series designed for patients after they have completed treatment.   Ms. Kintzel was encouraged to take advantage of our many other support services programs, support groups, and/or counseling in coping with her new life as a cancer survivor after completing anti-cancer treatment.  She was offered support today through active listening and expressive supportive counseling.  She was given information regarding our available services and encouraged to contact me with any questions or for help enrolling in any of our support group/programs.    A total of 50 minutes of face-to-face time was spent with this patient with greater than 50% of that time in counseling and care-coordination.   [name], NP  Survivorship Program [facility] [phone]   Note: PRIMARY CARE PROVIDER Stephens Shire, [place] [phone]

## 2015-04-11 NOTE — Telephone Encounter (Signed)
LM for pt to return call with any questions or concerns.     Personalized VM- advised pt of negative doppler results.

## 2015-04-11 NOTE — Progress Notes (Signed)
Radiation Oncology         [phone] ________________________________  Name: Teresa Aguilar MRN: [id number]  Date: 04/11/2015  DOB: 1960-07-30  Follow-Up Visit Note  CC: Stephens Shire, MD  Magrinat, [name], MD  No diagnosis found.  Diagnosis: pT1c pN0, Stage IA invasive ductal carcinoma of the right female breast, grade 3, ER/PR positive, HER2 negative, Ki-67 66%  Interval Since Last Radiation:  1  months  [01/17/2015-03/06/2015]  Narrative:  The patient returns today for routine her follow-up with radiation oncology.  She reports symptoms of fatigue, occ. sharp pains in the breast, itching of the breast, and pain in her right arm and shoulder. A new blood clot is reported behind her right knee. The patient has administered the medication Xarelto for 4 to 5 days. In regards to her upper right arm pain, a doppler is planned for later today (04/11/2015).  " The patient projected slight anxiety and was not accompanied by family today. "My leg is better," she stated when asked about the condition of her leg. No nipple discharge or bleeding.                              ALLERGIES:  is allergic to morphine and related and sulfa antibiotics.  Meds: Current Outpatient Prescriptions  Medication Sig Dispense Refill  . eletriptan (RELPAX) 40 MG tablet Take 40 mg by mouth as needed for migraine or headache. One tablet by mouth at onset of headache. May repeat in 2 hours if headache persists or recurs.    Marland Kitchen emollient (BIAFINE) cream Apply topically as needed.    . methocarbamol (ROBAXIN) 750 MG tablet Take 750 mg by mouth every 6 (six) hours as needed for muscle spasms.    . non-metallic deodorant Jethro Poling) MISC Apply 1 application topically daily as needed.    . rivaroxaban (XARELTO) 10 MG TABS tablet Take 10 mg by mouth daily.    Marland Kitchen anastrozole (ARIMIDEX) 1 MG tablet Take 1 tablet (1 mg total) by mouth daily. (Patient not taking: Reported on 04/11/2015) 90 tablet 4  .  HYDROcodone-acetaminophen (NORCO) 10-325 MG per tablet Take 1 tablet by mouth every 6 (six) hours as needed. (Patient not taking: Reported on 04/11/2015) 30 tablet 0  . ibuprofen (ADVIL,MOTRIN) 200 MG tablet Take 200 mg by mouth every 6 (six) hours as needed for mild pain or moderate pain.    . meloxicam (MOBIC) 15 MG tablet Take 15 mg by mouth daily.     No current facility-administered medications for this encounter.    Physical Findings: The patient is in no acute distress. Patient is alert and oriented. There is no significant changes to the status of overall health to be noted at this time.  height is 5' 8" (1.727 m) and weight is 269 lb 9.6 oz (122.29 kg). Her temperature is 97.8 F (36.6 C). Her blood pressure is 127/78 and her pulse is 83.   Lungs are clear to auscultation bilaterally. Heart has regular rate and rhythm. No palpable cervical, supraclavicular, or axillary adenopathy. Breast: Skin is healed on the right breast. No palpable mass or nipple discharge. Mild hyperpigmentation changes. Left breast: no palpable mass or nipple discharge noted.  Lab Findings: Lab Results  Component Value Date   WBC 5.7 12/27/2014   HGB 12.2 12/27/2014   HCT 37.8 12/27/2014   MCV 95.7 12/27/2014   PLT 266 12/27/2014    Radiographic Findings: US Venous Img Lower  Unilateral Right  04/02/2015  CLINICAL DATA:  Right lower extremity pain and edema.  Prior DVT. EXAM: RIGHT LOWER EXTREMITY VENOUS DOPPLER ULTRASOUND TECHNIQUE: Gray-scale sonography with graded compression, as well as color Doppler and duplex ultrasound were performed to evaluate the lower extremity deep venous systems from the level of the common femoral vein and including the common femoral, femoral, profunda femoral, popliteal and calf veins including the posterior tibial, peroneal and gastrocnemius veins when visible. The superficial great saphenous vein was also interrogated. Spectral Doppler was utilized to evaluate flow at rest  and with distal augmentation maneuvers in the common femoral, femoral and popliteal veins. COMPARISON:  None available FINDINGS: Contralateral Common Femoral Vein: Respiratory phasicity is normal and symmetric with the symptomatic side. No evidence of thrombus. Normal compressibility. Common Femoral Vein: No evidence of thrombus. Normal compressibility, respiratory phasicity and response to augmentation. Saphenofemoral Junction: No evidence of thrombus. Normal compressibility and flow on color Doppler imaging. Profunda Femoral Vein: No evidence of thrombus. Normal compressibility and flow on color Doppler imaging. Femoral Vein: Right distal femoral vein is noncompressible with a small amount of nonocclusive intraluminal hypoechoic thrombus suspected. This involves only the distal femoral vein over a short segment. Very low thrombus burden. Preserved phasic flow and augmentation. Popliteal Vein: No evidence of thrombus. Normal compressibility, respiratory phasicity and response to augmentation. Calf Veins: No evidence of thrombus. Normal compressibility and flow on color Doppler imaging. Superficial Great Saphenous Vein: No evidence of thrombus. Normal compressibility and flow on color Doppler imaging. Venous Reflux:  None. Other Findings:  None. IMPRESSION: Findings suspicious for a small amount of nonocclusive DVT in the right distal femoral vein just above the knee. This is over a short segment. Very low thrombus burden. Remainder of the right lower extremity veins remain patent. Electronically Signed   By: Jerilynn Mages.  Shick M.D.   On: 04/02/2015 16:31    Impression:  Teresa Aguilar is a 54 year old female presenting to clinic in regards to her pT1c pN0, stage IA invasive ductal carcinoma of the right female breast, grade 3, ER/PR positive, HER2 negative, Ki-67 66%. The patient is recovering from the effects of radiation. She is managing reported symptoms appropriately, but presented with anxiety in regards to her  arm pain. She understands the importance of completing an annual mammogram. The patient understands that she can access her appointments and medical records via [facility].    Plan: Healthy methods of management in regards to reported symptoms were reviewed in detail. The patient has been advised to complete an annual mammogram and continue the administration of her new blood thinning medication until advised otherwise. She is instructed to attend her appointment to complete her doppler later today (04/11/2015). All vocalized questions and concerns have been addressed. If the patient develops any further questions or concerns in regards to her treatment and recovery, she has been encouraged to contact Dr. Sondra Come, MD. She is advised of her follow-up appointment with radiation oncology to take place as scheduled in six months time and will additionally follow-up with medical oncology.   This document serves as a record of services personally performed by [name], MD. It was created on his behalf by Lenn Cal, a trained medical scribe. The creation of this record is based on the scribe's personal observations and the provider's statements to them. This document has been checked and approved by the attending provider.    -----------------------------------  Blair Promise, PhD, MD

## 2015-04-11 NOTE — Progress Notes (Addendum)
*  Preliminary Results* Right upper extremity venous duplex completed. Right upper extremity is negative for acute deep and superficial vein thrombosis. There is evidence of chronic thrombus of the right internal jugular vein.  Preliminary results discussed with Clarise Cruz of [facility] office.  04/11/2015 1:04 PM  Maudry Mayhew, RVT, RDCS, RDMS

## 2015-04-11 NOTE — Progress Notes (Signed)
Vascular lab called preliminary results to nurse desk. Pt is negative for DVT and superficial thrombi. Pt advised of results and discharged from the vascular lab.

## 2015-04-11 NOTE — Progress Notes (Signed)
Teresa Aguilar is here for follow- up of radiation to her Right Breast completed 03/06/15. She reports fatigue, states she is tired all the time, but states it feels as if it is getting better. Her skin is hyperpigmented, and slightly red, but healed from the moist desquamation she experienced. She has a new blood clot behind her Right knee, and has been on xarelto for 4 or 5 days. She now has pain in her Right arm, which she has a doppler planned for today to evaluate, and has several questions regarding this.   BP 127/78 mmHg  Pulse 83  Temp(Src) 97.8 F (36.6 C)  Ht 5\' 8"  (1.727 m)  Wt 269 lb 9.6 oz (122.29 kg)  BMI 41.00 kg/m2

## 2015-04-12 ENCOUNTER — Telehealth: Payer: Self-pay | Admitting: Oncology

## 2015-04-12 ENCOUNTER — Telehealth: Payer: Self-pay | Admitting: Nurse Practitioner

## 2015-04-12 ENCOUNTER — Other Ambulatory Visit: Payer: Self-pay | Admitting: Nurse Practitioner

## 2015-04-12 NOTE — Telephone Encounter (Signed)
Called and left a message with 10/31 follow up and this time frame is ok per heather

## 2015-04-12 NOTE — Telephone Encounter (Signed)
Called and spoke with patient following yesterday's visit and negative doppler study.  Discussed with Dr. Jana Hakim, as well.  We will move patient's follow up appointment with Dr. Jana Hakim to November (rather than January 2017). She will begin her anastrozole on April 16, 2015. Pt voiced understanding.

## 2015-04-15 ENCOUNTER — Ambulatory Visit (HOSPITAL_BASED_OUTPATIENT_CLINIC_OR_DEPARTMENT_OTHER): Payer: BC Managed Care – PPO | Admitting: Oncology

## 2015-04-15 ENCOUNTER — Telehealth: Payer: Self-pay | Admitting: Oncology

## 2015-04-15 VITALS — BP 135/76 | HR 109 | Temp 97.5°F | Resp 19 | Ht 68.0 in | Wt 269.7 lb

## 2015-04-15 DIAGNOSIS — I82411 Acute embolism and thrombosis of right femoral vein: Secondary | ICD-10-CM

## 2015-04-15 DIAGNOSIS — C50311 Malignant neoplasm of lower-inner quadrant of right female breast: Secondary | ICD-10-CM

## 2015-04-15 NOTE — Telephone Encounter (Signed)
Appointments made and avs printed for pateint °

## 2015-04-15 NOTE — Progress Notes (Signed)
[place]  Telephone:([phone]) [phone] Fax:(336) [phone]     ID: [name] DOB: 12/19/1960  MR#: [id number]  GOT#:[id number]  Patient Care Team: Stephens Shire, MD as PCP - General (Family Medicine) [name], MD as Consulting Physician (General Surgery) Chauncey Cruel, MD as Consulting Physician (Oncology) [name], MD as Consulting Physician (Radiation Oncology) [name], NP as Nurse Practitioner (Hematology and Oncology) PCP: Stephens Shire, MD GYN: [name] MD SU: [name] MD OTHER MD: [name] MD, Phylliss Bob MD, Jamie Kato MD  CHIEF COMPLAINT: Estrogen receptor positive breast cancer  CURRENT TREATMENT: Adjuvant chemotherapy   BREAST CANCER HISTORY: from the original intake note:  "Teresa Aguilar" had routine screening mammography January 2016 in Dr. Tonette Bihari office showing a possible change in the right breast. On 07/24/2014 at the breast Center she underwent right digital mammography and ultrasonography. The breast density was category A. In the lower inner quadrant of the right breast there was a 2 cm mass which was palpable by exam. Ultrasound confirmed a 1.5 cm irregular hypoechoic mass at the 3:30 o'clock position 12 cm from the nipple. There was no right axillary adenopathy.  Biopsy of the mass in question 07/24/2014 showed ([id number]) invasive ductal carcinoma, grade 2 or 3, estrogen receptor 98% positive, progesterone receptor 76% positive, both with strong staining intensity, with an MIB-1 of 66% and no HER-2 amplification by FISH.  The patient's case was discussed at the multidisciplinary breast cancer conference 08/08/2014 and it was felt the patient would benefit from breast conserving surgery and likely would need an Oncotype to decide on optimal systemic therapy. She would need radiation and hormones. The question of genetics counseling was also raised.  On 08/27/2014 the patient underwent right  lumpectomy and sentinel lymph node sampling. The final pathology ([id number]) confirmed invasive ductal carcinoma, grade 3, measuring 1.9 cm. There was evidence of lymphovascular and perineural invasion. Margins were negative but close, the closest margin for the in situ component being 1 mm. The single sentinel lymph node was negative. Repeat HER-2 was again negative, with a signals ratio of 0.97 and a number per cell of 1.60.  The patient's subsequent history is as detailed below  INTERVAL HISTORY: Teresa Aguilar returns today for follow up of her breast cancer. Since her last visit here she had Dopplers of the right lower and right upper extremities. The right upper extremity Doppler was negative. The one on the right lower extremity showed a very small nonocclusive thrombus in the distal right femoral vein, and she was started on Rivaroxaban by Dr. Marlyn Corporal. She is tolerating that well. She has 3 months worth of samples. She is now ready to start anastrozole  REVIEW OF SYSTEMS: Teresa Aguilar still feels moderately tired but things are getting better, she tells me. She denies any ankle swelling or arm swelling. She denies shortness of breath, cough, or pleurisy. Her skin has healed very nicely and she has taken herself off all pain medications. She is still using some of the creams provided by radiation oncology. Aside from these issues a detailed review of systems today was stable  PAST MEDICAL HISTORY: Past Medical History  Diagnosis Date  . Hypoxia     after surgery  . DVT (deep venous thrombosis) ([place]) 03/2014    R leg, post op- on Xarelto   . Hypertension     pt. reports that she has been higher in the past & again now but doesn't take any med.,never has for [facility]  BP, pt. stating that BP is high now b/c of her pain in her back.    . H/O cardiovascular stress test 2012     test done for stress from her Father dying, had chest pain ,pt. had a stress/echo  test, told it was wnl  . Anxiety     relative for  to pain & frustration of prev. hospitalization   . Gestational diabetes 1986; 1996  . Migraines     "at least q other week" (08/27/2014)  . Arthritis     degenerative lumbar spine    . Chronic back pain   . Breast cancer of lower-inner quadrant of right female breast ([place])   . Colon cancer ([place])   . Breast cancer ([place]) 2016  . Family history of breast cancer   . Family history of colon cancer   . PONV (postoperative nausea and vomiting)     low O2 sats, < 50% post op  . Family history of adverse reaction to anesthesia     PONV  . GERD (gastroesophageal reflux disease)     used during chemo, has not taken recently  . Radiation 01/17/15-03/06/15    right breast 50.4 gray, lumpectomy cavity boost to 12 gray    PAST SURGICAL HISTORY: Past Surgical History  Procedure Laterality Date  . Endometrial ablation  ~ 1998  . Colonoscopy w/ polypectomy  2015    found tubulovillous adenoma with focal high grade displasia  . Plantar fascia release Right ~ 2000  . Eye muscle surgery Bilateral ~ 1965    to fix cross-eye as child  . Sacroiliac joint fusion Right 02/22/2014    Procedure: SACROILIAC JOINT FUSION;  Surgeon: Sinclair Ship, MD;  Location: [facility];  Service: Orthopedics;  Laterality: Right;  Right sided sacroiliac joint fusion  . Radiofrequency ablation nerves      x 2 to nerves in her lower back  . Dental surgery  ~ 2011    "replaced bone graft upper jaw; put 2 implants in"  . Tubal ligation  1996  . Axillary sentinel node biopsy Right 08/27/2014    Archie Endo 08/27/2014  . Tonsillectomy  1988  . Excisional hemorrhoidectomy  2015  . Back surgery    . Breast biopsy Right 07/24/2014    core biopsy  . Breast lumpectomy Right 08/27/2014    Archie Endo 08/27/2014  . Radioactive seed guided mastectomy with axillary sentinel lymph node biopsy Right 08/27/2014    Procedure: RADIOACTIVE SEED GUIDED RIGHT BREAST LUMPECTOMY WITH RIGHT AXILLARY SENTINEL LYMPH NODE BIOPSY;  Surgeon: [name],  MD;  Location: [place];  Service: General;  Laterality: Right;  . Portacath placement N/A 09/19/2014    Procedure: INSERTION PORT-A-CATH;  Surgeon: [name], MD;  Location: [place];  Service: General;  Laterality: N/A;  . Port-a-cath removal Right 12/31/2014    Procedure: REMOVAL PORT-A-CATH;  Surgeon: [name], MD;  Location: [place];  Service: General;  Laterality: Right;    FAMILY HISTORY Family History  Problem Relation Age of Onset  . Breast cancer Cousin 79    maternal first cousin  . Colon cancer Maternal Aunt 75  . Throat cancer Maternal Aunt 75    smoker  . Breast cancer Paternal Aunt 66  . Heart disease Mother   . Heart disease Father   . Diabetes type II Father   . Stomach cancer Maternal Uncle     dx in his 16s  . Colon cancer Cousin 65     maternal first  cousin  . Prostate cancer Cousin 59    paternal first cousin  . Breast cancer Paternal Aunt     dx in her 74s  . Breast cancer Other     mother's paternal first cousin  . Colon cancer Other 4    MGF's sister  . Brain cancer Other 70    MGFs sister  . Breast cancer Other     MGF's paternal aunt  . Prostate cancer Other     MGF's paternal first cousin   the patient's father died at the age of 56 from heart failure in the setting of diabetes. The patient's mother is currently 74 years old. The patient has 3 brothers, 2 sisters. There is no history of breast, ovarian, or colon cancer in first-degree relatives. However, on the mother's side the patient has 3 relatives with breast cancer (a cousin diagnosed age 36, a second cousin diagnosed age 97, and a great aunt). There is also a history of colon cancer diagnosed age around age 65 in a great aunt and small intestinal cancer in an aunt diagnosed age 7. On the father's side there are 2 cousins with breast cancer diagnosed age 40 and 75, and a cousin with colon cancer diagnosed age 11  GYNECOLOGIC HISTORY:  No LMP recorded. Patient has had an  ablation. Menarche age 39, the patient carried 2 children to term, the first at age 47. After the first live birth she had a premature girl who died after one day (she had been a breech birth) and she also had 3 miscarriages. The patient underwent endometrial ablation in 1998 and has had no periods since that time.  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 Her husband Braulio Conte works for [employer] in the [place] and Manufacturing systems engineer. Son Roshni Burbano lives in [place] and works in Education officer, community. He was an Gaffer) and son Sansa Alkema is 52 and works for [employer]. He also lives in [place]. The patient has one granddaughter. The patient is a Psychologist, forensic    ADVANCED DIRECTIVES: Not in place   HEALTH MAINTENANCE: Social History  Substance Use Topics  . Smoking status: Never Smoker   . Smokeless tobacco: Never Used  . Alcohol Use: No     Colonoscopy: June 2015/May and last repeat planned 10/03/2014  PAP: 2015  Bone density:  Lipid panel:  Allergies  Allergen Reactions  . Morphine And Related Nausea And Vomiting  . Sulfa Antibiotics Hives    Current Outpatient Prescriptions  Medication Sig Dispense Refill  . anastrozole (ARIMIDEX) 1 MG tablet Take 1 tablet (1 mg total) by mouth daily. 90 tablet 4  . eletriptan (RELPAX) 40 MG tablet Take 40 mg by mouth as needed for migraine or headache. One tablet by mouth at onset of headache. May repeat in 2 hours if headache persists or recurs.    Marland Kitchen emollient (BIAFINE) cream Apply topically as needed.    . non-metallic deodorant Jethro Poling) MISC Apply 1 application topically daily as needed.    . rivaroxaban (XARELTO) 10 MG TABS tablet Take 10 mg by mouth daily.     No current facility-administered medications for this visit.    OBJECTIVE: Middle-aged white woman who appears stated age 54 Vitals:   04/15/15 1330  BP: 135/76  Pulse:  109  Temp: 97.5 F (36.4 C)  Resp: 19     Body mass index is 41.02 kg/(m^2).    ECOG  FS:1 - Symptomatic but completely ambulatory Filed Weights   04/15/15 1330  Weight: 269 lb 11.2 oz (122.335 kg)   Sclerae unicteric, EOMs intact Oropharynx clear, dentition in good repair No cervical or supraclavicular adenopathy Lungs no rales or rhonchi Heart regular rate and rhythm Abd soft, obese, nontender, positive bowel sounds MSK no focal spinal tenderness, no right upper extremity lymphedema Neuro: nonfocal, well oriented, appropriate affect Breasts: The right breast is status post lumpectomy and radiation. There is minimal residual erythema, no desquamation. There are no suspicious masses. The right axilla is benign. Left breast is unremarkable.   LAB RESULTS:  CMP     Component Value Date/Time   NA 141 12/27/2014 1315   NA 140 11/26/2014 0828   K 3.8 12/27/2014 1315   K 3.9 11/26/2014 0828   CL 109 12/27/2014 1315   CO2 24 12/27/2014 1315   CO2 18* 11/26/2014 0828   GLUCOSE 138* 12/27/2014 1315   GLUCOSE 321* 11/26/2014 0828   BUN 12 12/27/2014 1315   BUN 11.7 11/26/2014 0828   CREATININE 0.77 12/27/2014 1315   CREATININE 0.8 11/26/2014 0828   CALCIUM 9.2 12/27/2014 1315   CALCIUM 9.1 11/26/2014 0828   PROT 6.7 11/26/2014 0828   PROT 6.4 09/26/2014 1406   ALBUMIN 3.6 11/26/2014 0828   ALBUMIN 3.9 09/26/2014 1406   AST 11 11/26/2014 0828   AST 15 09/26/2014 1406   ALT 18 11/26/2014 0828   ALT 13 09/26/2014 1406   ALKPHOS 80 11/26/2014 0828   ALKPHOS 83 09/26/2014 1406   BILITOT 0.53 11/26/2014 0828   BILITOT 0.7 09/26/2014 1406   GFRNONAA >60 12/27/2014 1315   GFRAA >60 12/27/2014 1315    INo results found for: SPEP, UPEP  Lab Results  Component Value Date   WBC 5.7 12/27/2014   NEUTROABS 3.9 12/27/2014   HGB 12.2 12/27/2014   HCT 37.8 12/27/2014   MCV 95.7 12/27/2014   PLT 266 12/27/2014      Chemistry      Component Value Date/Time   NA 141 12/27/2014  1315   NA 140 11/26/2014 0828   K 3.8 12/27/2014 1315   K 3.9 11/26/2014 0828   CL 109 12/27/2014 1315   CO2 24 12/27/2014 1315   CO2 18* 11/26/2014 0828   BUN 12 12/27/2014 1315   BUN 11.7 11/26/2014 0828   CREATININE 0.77 12/27/2014 1315   CREATININE 0.8 11/26/2014 0828      Component Value Date/Time   CALCIUM 9.2 12/27/2014 1315   CALCIUM 9.1 11/26/2014 0828   ALKPHOS 80 11/26/2014 0828   ALKPHOS 83 09/26/2014 1406   AST 11 11/26/2014 0828   AST 15 09/26/2014 1406   ALT 18 11/26/2014 0828   ALT 13 09/26/2014 1406   BILITOT 0.53 11/26/2014 0828   BILITOT 0.7 09/26/2014 1406       No results found for: LABCA2  No components found for: LABCA125  No results for input(s): INR in the last 168 hours.  Urinalysis    Component Value Date/Time   COLORURINE AMBER* 02/21/2014 1540   APPEARANCEUR CLOUDY* 02/21/2014 1540   LABSPEC 1.029 02/21/2014 1540   PHURINE 5.0 02/21/2014 1540   GLUCOSEU NEGATIVE 02/21/2014 1540   HGBUR SMALL* 02/21/2014 1540   BILIRUBINUR SMALL* 02/21/2014 1540   KETONESUR 15* 02/21/2014 1540   PROTEINUR NEGATIVE 02/21/2014 1540   UROBILINOGEN 1.0 02/21/2014 1540   NITRITE POSITIVE* 02/21/2014 1540   LEUKOCYTESUR TRACE* 02/21/2014 1540    STUDIES: US Venous Img Lower Unilateral  Right  04/02/2015  CLINICAL DATA:  Right lower extremity pain and edema.  Prior DVT. EXAM: RIGHT LOWER EXTREMITY VENOUS DOPPLER ULTRASOUND TECHNIQUE: Gray-scale sonography with graded compression, as well as color Doppler and duplex ultrasound were performed to evaluate the lower extremity deep venous systems from the level of the common femoral vein and including the common femoral, femoral, profunda femoral, popliteal and calf veins including the posterior tibial, peroneal and gastrocnemius veins when visible. The superficial great saphenous vein was also interrogated. Spectral Doppler was utilized to evaluate flow at rest and with distal augmentation maneuvers in the common  femoral, femoral and popliteal veins. COMPARISON:  None available FINDINGS: Contralateral Common Femoral Vein: Respiratory phasicity is normal and symmetric with the symptomatic side. No evidence of thrombus. Normal compressibility. Common Femoral Vein: No evidence of thrombus. Normal compressibility, respiratory phasicity and response to augmentation. Saphenofemoral Junction: No evidence of thrombus. Normal compressibility and flow on color Doppler imaging. Profunda Femoral Vein: No evidence of thrombus. Normal compressibility and flow on color Doppler imaging. Femoral Vein: Right distal femoral vein is noncompressible with a small amount of nonocclusive intraluminal hypoechoic thrombus suspected. This involves only the distal femoral vein over a short segment. Very low thrombus burden. Preserved phasic flow and augmentation. Popliteal Vein: No evidence of thrombus. Normal compressibility, respiratory phasicity and response to augmentation. Calf Veins: No evidence of thrombus. Normal compressibility and flow on color Doppler imaging. Superficial Great Saphenous Vein: No evidence of thrombus. Normal compressibility and flow on color Doppler imaging. Venous Reflux:  None. Other Findings:  None. IMPRESSION: Findings suspicious for a small amount of nonocclusive DVT in the right distal femoral vein just above the knee. This is over a short segment. Very low thrombus burden. Remainder of the right lower extremity veins remain patent. Electronically Signed   By: Jerilynn Mages.  Shick M.D.   On: 04/02/2015 16:31     ASSESSMENT: 54 y.o. [place] woman status post right lumpectomy and sentinel lymph node sampling 08/27/2014 for a pT1c pN0, stage IA invasive ductal carcinoma, grade 3, estrogen and progesterone receptor positive, HER-2 negative, with an MIB-1 of 66%  (1) adjuvant chemotherapy with cyclophosphamide and docetaxel every 21 days 4, with onpro support, completed 11/26/2014  (2) adjuvant radiation  01/17/2015-03/06/2015: Right breast 50.4 gray in 28 fractions, lumpectomy cavity boost 12 gray in 6 fractions  (3) anastrozole: to start 04/16/2015  (a) bone density scan08/04/2015 shows osteopenia with a T score of -1.7.  (4) genetics panel negative for any mutations in the following genes: APC, ATM, AXIN2, BARD1, BMPR1A, BRCA1, BRCA2, BRIP1, CDH1, CDK4, CDKN2A, CHEK2, EPCAM, FANCC, MLH1, MSH2, MSH6, MUTYH, NBN, PALB2, PMS2, POLD1, POLE, PTEN, RAD51C, RAD51D, SCG5/GREM1, SMAD4, STK11, TP53, VHL, and XRCC2.   (5) R LE DVT documented 03/20/2014 following right sacroiliac joint effusion 02/22/2014  (a) hypercoagulable panel normal  (b) received Rivaroxaban for 6 months, completed April 2016   (c) right lower extremity venous Doppler 04/02/2015 was read as suspicious for a small nonocclusive thrombus in the right distal femoral vein. This is felt to be a very low thrombus pertinent with preserved phasic flow and augmentation.  (d) rivaroxaban resumed October 2016  (6) obesity:   PLAN: Teresa Aguilar developed a very small clot in the previously involved leg in and has been put back on Rivaroxaban by Dr. Tollie Pizza, the plan being for at least 3 months of coverage after which he will reassess. She is tolerating the anticoagulants well, as she did before.  She is now ready to start the  anastrozole. She has a good understanding of the possible toxicities, side effects and complications of this agent and in particular she understands that compared with placebo there is absolutely no increase in clotting risk with anastrozole or other aromatase inhibitors (as contrast with tamoxifen)  She is going to see as again late December just to make sure she is tolerating this well. If she tolerates the anastrozole as predicted the plan will be to continue that for 5 years.  She knows to call for any problems that may develop before her next visit here. Chauncey Cruel, MD   04/15/2015 1:59 PM

## 2015-04-23 ENCOUNTER — Encounter: Payer: Self-pay | Admitting: *Deleted

## 2015-04-23 ENCOUNTER — Telehealth: Payer: Self-pay | Admitting: *Deleted

## 2015-04-23 NOTE — Telephone Encounter (Signed)
VM message recieved form patient @ 10:03 am requesting the report of her venous doppler study be sent to her PCP, Dr. Tollie Pizza and that it be put in her My Chart. Both of these were done as patient requested. VM message left for pt on identified phone #

## 2015-04-24 ENCOUNTER — Telehealth: Payer: Self-pay

## 2015-04-24 NOTE — Telephone Encounter (Signed)
Patient called today requesting results from "scans" that Chestine Spore, NP ordered on the 28th of October.  Writer found the results, patient asked that they be released to My Chart.  Writer explained that Nira Conn would have to release them.  Patient needed them for an appt that she had not yet made with Dr. Tollie Pizza.  Writer printed the results and mailed them to the patient.

## 2015-04-29 ENCOUNTER — Encounter: Payer: Self-pay | Admitting: Nurse Practitioner

## 2015-06-21 ENCOUNTER — Telehealth: Payer: Self-pay | Admitting: Oncology

## 2015-06-21 ENCOUNTER — Ambulatory Visit (HOSPITAL_BASED_OUTPATIENT_CLINIC_OR_DEPARTMENT_OTHER): Payer: BC Managed Care – PPO

## 2015-06-21 DIAGNOSIS — C50311 Malignant neoplasm of lower-inner quadrant of right female breast: Secondary | ICD-10-CM | POA: Diagnosis not present

## 2015-06-21 DIAGNOSIS — M532X8 Spinal instabilities, sacral and sacrococcygeal region: Secondary | ICD-10-CM

## 2015-06-21 DIAGNOSIS — I82491 Acute embolism and thrombosis of other specified deep vein of right lower extremity: Secondary | ICD-10-CM

## 2015-06-21 DIAGNOSIS — G43109 Migraine with aura, not intractable, without status migrainosus: Secondary | ICD-10-CM

## 2015-06-21 DIAGNOSIS — Z6841 Body Mass Index (BMI) 40.0 and over, adult: Secondary | ICD-10-CM

## 2015-06-21 LAB — CBC WITH DIFFERENTIAL/PLATELET
BASO%: 0.3 % (ref 0.0–2.0)
BASOS ABS: 0 10*3/uL (ref 0.0–0.1)
EOS%: 1 % (ref 0.0–7.0)
Eosinophils Absolute: 0 10*3/uL (ref 0.0–0.5)
HCT: 42.2 % (ref 34.8–46.6)
HGB: 14.2 g/dL (ref 11.6–15.9)
LYMPH%: 23.2 % (ref 14.0–49.7)
MCH: 30.4 pg (ref 25.1–34.0)
MCHC: 33.6 g/dL (ref 31.5–36.0)
MCV: 90.4 fL (ref 79.5–101.0)
MONO#: 0.4 10*3/uL (ref 0.1–0.9)
MONO%: 10.9 % (ref 0.0–14.0)
NEUT#: 2.5 10*3/uL (ref 1.5–6.5)
NEUT%: 64.6 % (ref 38.4–76.8)
Platelets: 106 10*3/uL — ABNORMAL LOW (ref 145–400)
RBC: 4.67 10*6/uL (ref 3.70–5.45)
RDW: 15.2 % — AB (ref 11.2–14.5)
WBC: 3.9 10*3/uL (ref 3.9–10.3)
lymph#: 0.9 10*3/uL (ref 0.9–3.3)

## 2015-06-21 LAB — COMPREHENSIVE METABOLIC PANEL
ALBUMIN: 4.1 g/dL (ref 3.5–5.0)
ALT: 16 U/L (ref 0–55)
AST: 18 U/L (ref 5–34)
Alkaline Phosphatase: 94 U/L (ref 40–150)
Anion Gap: 8 mEq/L (ref 3–11)
BUN: 16.5 mg/dL (ref 7.0–26.0)
CHLORIDE: 108 meq/L (ref 98–109)
CO2: 25 mEq/L (ref 22–29)
Calcium: 9.4 mg/dL (ref 8.4–10.4)
Creatinine: 0.8 mg/dL (ref 0.6–1.1)
EGFR: 81 mL/min/{1.73_m2} — ABNORMAL LOW (ref >90)
GLUCOSE: 129 mg/dL (ref 70–140)
Potassium: 4.2 mEq/L (ref 3.5–5.1)
Sodium: 141 mEq/L (ref 136–145)
Total Bilirubin: 0.49 mg/dL (ref 0.20–1.20)
Total Protein: 7.3 g/dL (ref 6.4–8.3)

## 2015-06-21 NOTE — Telephone Encounter (Signed)
Returned patients call to come in for her lab today instead of 1/10 due to weather

## 2015-06-22 LAB — FOLLICLE STIMULATING HORMONE: FSH: 73 m[IU]/mL

## 2015-06-25 ENCOUNTER — Other Ambulatory Visit: Payer: Self-pay

## 2015-06-25 LAB — ESTRADIOL, ULTRA SENS

## 2015-06-27 ENCOUNTER — Telehealth: Payer: Self-pay | Admitting: *Deleted

## 2015-06-27 NOTE — Telephone Encounter (Signed)
Voicemail: "I feel a cold sore coming up.  Can I take Valtrex 1 Gm with anastrozole and xarelto?"  Collaborated with [facility] Pharmacist and there is no interaction.   Called patient with this information.  Asked what provider prescribed this for her as it is not on the medication list.  "It's my husband's."  Informed her we do not advise her taking someone else's prescription.  "Is Dr. Jana Hakim going to get mad at me?" Again advised we do not advise taking medication prescribed for someone else.  Dose, expiration and more reasons.  There are OTC options like L-Lysine.

## 2015-07-01 ENCOUNTER — Other Ambulatory Visit (HOSPITAL_COMMUNITY): Payer: Self-pay | Admitting: Family Medicine

## 2015-07-01 DIAGNOSIS — M7989 Other specified soft tissue disorders: Principal | ICD-10-CM

## 2015-07-01 DIAGNOSIS — M79661 Pain in right lower leg: Secondary | ICD-10-CM

## 2015-07-02 ENCOUNTER — Telehealth: Payer: Self-pay | Admitting: Oncology

## 2015-07-02 ENCOUNTER — Ambulatory Visit (HOSPITAL_COMMUNITY)
Admission: RE | Admit: 2015-07-02 | Discharge: 2015-07-02 | Disposition: A | Discharge status: Home / Self Care | Payer: BC Managed Care – PPO | Source: Ambulatory Visit | Attending: Family Medicine | Admitting: Family Medicine

## 2015-07-02 ENCOUNTER — Ambulatory Visit (HOSPITAL_BASED_OUTPATIENT_CLINIC_OR_DEPARTMENT_OTHER): Payer: BC Managed Care – PPO | Admitting: Oncology

## 2015-07-02 VITALS — BP 154/85 | HR 107 | Temp 97.4°F | Resp 20 | Ht 68.0 in | Wt 271.1 lb

## 2015-07-02 DIAGNOSIS — I82491 Acute embolism and thrombosis of other specified deep vein of right lower extremity: Secondary | ICD-10-CM

## 2015-07-02 DIAGNOSIS — M79604 Pain in right leg: Secondary | ICD-10-CM | POA: Diagnosis not present

## 2015-07-02 DIAGNOSIS — Z86718 Personal history of other venous thrombosis and embolism: Secondary | ICD-10-CM | POA: Diagnosis present

## 2015-07-02 DIAGNOSIS — C50311 Malignant neoplasm of lower-inner quadrant of right female breast: Secondary | ICD-10-CM

## 2015-07-02 DIAGNOSIS — M7989 Other specified soft tissue disorders: Secondary | ICD-10-CM | POA: Diagnosis not present

## 2015-07-02 DIAGNOSIS — M79661 Pain in right lower leg: Secondary | ICD-10-CM

## 2015-07-02 MED ORDER — GABAPENTIN 300 MG PO CAPS: 300.0000 mg | ORAL_CAPSULE | Freq: Every day | ORAL | Status: DC

## 2015-07-02 NOTE — Progress Notes (Signed)
*  Preliminary Results* Right lower extremity venous duplex completed. Right lower extremity is negative for deep vein thrombosis. There is no evidence of right Baker's cyst.  07/02/2015 3:37 PM  Maudry Mayhew, RVT, RDCS, RDMS

## 2015-07-02 NOTE — Progress Notes (Signed)
[facility]  [phone] [phone]     ID: Teresa Aguilar DOB: [birth year]  MR#: [id number]  IOX#:[id number]  Patient Care Team: Teresa Shire, MD as PCP - General (Family Medicine) Teresa Bookbinder, MD as Consulting Physician (General Surgery) Teresa Cruel, MD as Consulting Physician (Oncology) Teresa Pray, MD as Consulting Physician (Radiation Oncology) Teresa Cheese, NP as Nurse Practitioner (Hematology and Oncology) PCP: Teresa Shire, MD GYN: Teresa Munro MD SU: Teresa Bookbinder MD OTHER MD: Teresa Pray MD, Teresa Bob MD, Teresa Kato MD  CHIEF COMPLAINT: Estrogen receptor positive breast cancer  CURRENT TREATMENT: Adjuvant chemotherapy   BREAST CANCER HISTORY: from the original intake note:  "Teresa Aguilar" had routine screening mammography January 2016 in Teresa Aguilar office showing a possible change in the right breast. On 07/24/2014 at the breast Center she underwent right digital mammography and ultrasonography. The breast density was category A. In the lower inner quadrant of the right breast there was a 2 cm mass which was palpable by exam. Ultrasound confirmed a 1.5 cm irregular hypoechoic mass at the 3:30 o'clock position 12 cm from the nipple. There was no right axillary adenopathy.  Biopsy of the mass in question 07/24/2014 showed ([id number]) invasive ductal carcinoma, grade 2 or 3, estrogen receptor 98% positive, progesterone receptor 76% positive, both with strong staining intensity, with an MIB-1 of 66% and no HER-2 amplification by FISH.  The patient's case was discussed at the multidisciplinary breast cancer conference 08/08/2014 and it was felt the patient would benefit from breast conserving surgery and likely would need an Oncotype to decide on optimal systemic therapy. She would need radiation and hormones. The question of genetics counseling was also raised.  On 08/27/2014 the patient underwent right  lumpectomy and sentinel lymph node sampling. The final pathology ([id number]) confirmed invasive ductal carcinoma, grade 3, measuring 1.9 cm. There was evidence of lymphovascular and perineural invasion. Margins were negative but close, the closest margin for the in situ component being 1 mm. The single sentinel lymph node was negative. Repeat HER-2 was again negative, with a signals ratio of 0.97 and a number per cell of 1.60.  The patient's subsequent history is as detailed below  INTERVAL HISTORY: Teresa Aguilar returns today for follow up of her estrogen receptor positive breast cancer, accompanied by a friend.she started anastrozole November 2016. Hot flashes are the main problem. They tend to occur every hour or 2. They also occur several times at night. Vaginal dryness is not a major issue.  She is also on Rivaroxaban and she tells me she will be on that lifelong because of repeated clots. She is having some bruising but no bleeding unless she cuts herself.   REVIEW OF SYSTEMS: Annettehas some discomfort in the right breast surgical area, which is stable. Occasionally she has cold sores in her mouth. She gets short of breath when walking up stairs. She has some stress urinary incontinence. She has a history of migraines. She feels forgetful and anxious but not depressed. A detailed review of systems today was otherwise stable  PAST MEDICAL HISTORY: Past Medical History  Diagnosis Date  . Hypoxia     after surgery  . DVT (deep venous thrombosis) ([place]) 03/2014    R leg, post op- on Xarelto   . Hypertension     pt. reports that she has been higher in the past & again now but doesn't take any med.,never has for ^BP, pt. stating that BP is high now  b/c of her pain in her back.    . H/O cardiovascular stress test 2012     test done for stress from her Father dying, had chest pain ,pt. had a stress/echo  test, told it was wnl  . Anxiety     relative for to pain & frustration of prev.  hospitalization   . Gestational diabetes 1986; 1996  . Migraines     "at least q other week" (08/27/2014)  . Arthritis     degenerative lumbar spine    . Chronic back pain   . Breast cancer of lower-inner quadrant of right female breast ([place])   . Colon cancer ([place])   . Breast cancer ([place]) 2016  . Family history of breast cancer   . Family history of colon cancer   . PONV (postoperative nausea and vomiting)     low O2 sats, < 50% post op  . Family history of adverse reaction to anesthesia     PONV  . GERD (gastroesophageal reflux disease)     used during chemo, has not taken recently  . Radiation 01/17/15-03/06/15    right breast 50.4 gray, lumpectomy cavity boost to 12 gray    PAST SURGICAL HISTORY: Past Surgical History  Procedure Laterality Date  . Endometrial ablation  ~ 1998  . Colonoscopy w/ polypectomy  2015    found tubulovillous adenoma with focal high grade displasia  . Plantar fascia release Right ~ 2000  . Eye muscle surgery Bilateral ~ 1965    to fix cross-eye as child  . Sacroiliac joint fusion Right 02/22/2014    Procedure: SACROILIAC JOINT FUSION;  Surgeon: Teresa Ship, MD;  Location: [facility];  Service: Orthopedics;  Laterality: Right;  Right sided sacroiliac joint fusion  . Radiofrequency ablation nerves      x 2 to nerves in her lower back  . Dental surgery  ~ 2011    "replaced bone graft upper jaw; put 2 implants in"  . Tubal ligation  1996  . Axillary sentinel node biopsy Right 08/27/2014    Teresa Aguilar 08/27/2014  . Tonsillectomy  1988  . Excisional hemorrhoidectomy  2015  . Back surgery    . Breast biopsy Right 07/24/2014    core biopsy  . Breast lumpectomy Right 08/27/2014    Teresa Aguilar 08/27/2014  . Radioactive seed guided mastectomy with axillary sentinel lymph node biopsy Right 08/27/2014    Procedure: RADIOACTIVE SEED GUIDED RIGHT BREAST LUMPECTOMY WITH RIGHT AXILLARY SENTINEL LYMPH NODE BIOPSY;  Surgeon: Teresa Bookbinder, MD;  Location: [facility];  Service:  General;  Laterality: Right;  . Portacath placement N/A 09/19/2014    Procedure: INSERTION PORT-A-CATH;  Surgeon: Teresa Bookbinder, MD;  Location: [facility];  Service: General;  Laterality: N/A;  . Port-a-cath removal Right 12/31/2014    Procedure: REMOVAL PORT-A-CATH;  Surgeon: Teresa Bookbinder, MD;  Location: [facility];  Service: General;  Laterality: Right;    FAMILY HISTORY Family History  Problem Relation Age of Onset  . Breast cancer Cousin 92    maternal first cousin  . Colon cancer Maternal Aunt 75  . Throat cancer Maternal Aunt 75    smoker  . Breast cancer Paternal Aunt 72  . Heart disease Mother   . Heart disease Father   . Diabetes type II Father   . Stomach cancer Maternal Uncle     dx in his 33s  . Colon cancer Cousin 30     maternal first cousin  . Prostate cancer Cousin 82  paternal first cousin  . Breast cancer Paternal Aunt     dx in her 59s  . Breast cancer Other     mother's paternal first cousin  . Colon cancer Other 63    MGF's sister  . Brain cancer Other 35    MGFs sister  . Breast cancer Other     MGF's paternal aunt  . Prostate cancer Other     MGF's paternal first cousin   the patient's father died at the age of 74 from heart failure in the setting of diabetes. The patient's mother is currently 33 years old. The patient has 3 brothers, 2 sisters. There is no history of breast, ovarian, or colon cancer in first-degree relatives. However, on the mother's side the patient has 3 relatives with breast cancer (a cousin diagnosed age 30, a second cousin diagnosed age 44, and a great aunt). There is also a history of colon cancer diagnosed age around age 54 in a great aunt and small intestinal cancer in an aunt diagnosed age 43. On the father's side there are 2 cousins with breast cancer diagnosed age 66 and 31, and a cousin with colon cancer diagnosed age 67  GYNECOLOGIC HISTORY:  No LMP recorded. Patient has had an ablation. Menarche age 13, the patient carried  2 children to term, the first at age 76. After the first live birth she had a premature girl who died after one day (she had been a breech birth) and she also had 3 miscarriages. The patient underwent endometrial ablation in 1998 and has had no periods since that time.  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 Her husband Braulio Conte works for [employer] in the [employer] and [employer] systems engineer. Son Rudene Poulsen lives in [place] and works in Education officer, community. He was an Gaffer) and son Clea Dubach is 40 and works for [employer]. He also lives in [place]. The patient has one granddaughter. The patient is a Psychologist, forensic    ADVANCED DIRECTIVES: Not in place   HEALTH MAINTENANCE: Social History  Substance Use Topics  . Smoking status: Never Smoker   . Smokeless tobacco: Never Used  . Alcohol Use: No     Colonoscopy: June 2015/May and last repeat planned 10/03/2014  PAP: 2015  Bone density:  Lipid panel:  Allergies  Allergen Reactions  . Morphine And Related Nausea And Vomiting  . Sulfa Antibiotics Hives    Current Outpatient Prescriptions  Medication Sig Dispense Refill  . anastrozole (ARIMIDEX) 1 MG tablet Take 1 tablet (1 mg total) by mouth daily. 90 tablet 4  . eletriptan (RELPAX) 40 MG tablet Take 40 mg by mouth as needed for migraine or headache. One tablet by mouth at onset of headache. May repeat in 2 hours if headache persists or recurs.    Marland Kitchen emollient (BIAFINE) cream Apply topically as needed.    . non-metallic deodorant Jethro Poling) MISC Apply 1 application topically daily as needed.    . rivaroxaban (XARELTO) 10 MG TABS tablet Take 10 mg by mouth daily.     No current facility-administered medications for this visit.    OBJECTIVE: Middle-aged white woman in no acute distress Filed Vitals:   07/02/15 1351  BP: 154/85  Pulse: 107  Temp: 97.4 F (36.3 C)  Resp: 20     Body  mass index is 41.23 kg/(m^2).    ECOG FS:1 - Symptomatic but completely ambulatory [employer]  07/02/15 1351  Weight: 271 lb 1.6 oz (122.97 kg)   Sclerae unicteric, pupils round and equal Oropharynx clear and moist-- no thrush or other lesions No cervical or supraclavicular adenopathy Lungs no rales or rhonchi Heart regular rate and rhythm Abd soft, obese, nontender, positive bowel sounds MSK no focal spinal tenderness, no upper extremity lymphedema Neuro: nonfocal, well oriented, appropriate affect Breasts: the right breast is status post lumpectomy and radiation. There is no evidence of local recurrence. The right axilla is benign. The left breast is unremarkable.    LAB RESULTS:  CMP     Component Value Date/Time   NA 141 06/21/2015 1043   NA 141 12/27/2014 1315   K 4.2 06/21/2015 1043   K 3.8 12/27/2014 1315   CL 109 12/27/2014 1315   CO2 25 06/21/2015 1043   CO2 24 12/27/2014 1315   GLUCOSE 129 06/21/2015 1043   GLUCOSE 138* 12/27/2014 1315   BUN 16.5 06/21/2015 1043   BUN 12 12/27/2014 1315   CREATININE 0.8 06/21/2015 1043   CREATININE 0.77 12/27/2014 1315   CALCIUM 9.4 06/21/2015 1043   CALCIUM 9.2 12/27/2014 1315   PROT 7.3 06/21/2015 1043   PROT 6.4 09/26/2014 1406   ALBUMIN 4.1 06/21/2015 1043   ALBUMIN 3.9 09/26/2014 1406   AST 18 06/21/2015 1043   AST 15 09/26/2014 1406   ALT 16 06/21/2015 1043   ALT 13 09/26/2014 1406   ALKPHOS 94 06/21/2015 1043   ALKPHOS 83 09/26/2014 1406   BILITOT 0.49 06/21/2015 1043   BILITOT 0.7 09/26/2014 1406   GFRNONAA >60 12/27/2014 1315   GFRAA >60 12/27/2014 1315    INo results found for: SPEP, UPEP  Lab Results  Component Value Date   WBC 3.9 06/21/2015   NEUTROABS 2.5 06/21/2015   HGB 14.2 06/21/2015   HCT 42.2 06/21/2015   MCV 90.4 06/21/2015   PLT 106* 06/21/2015      Chemistry      Component Value Date/Time   NA 141 06/21/2015 1043   NA 141 12/27/2014 1315   K 4.2 06/21/2015 1043   K 3.8  12/27/2014 1315   CL 109 12/27/2014 1315   CO2 25 06/21/2015 1043   CO2 24 12/27/2014 1315   BUN 16.5 06/21/2015 1043   BUN 12 12/27/2014 1315   CREATININE 0.8 06/21/2015 1043   CREATININE 0.77 12/27/2014 1315      Component Value Date/Time   CALCIUM 9.4 06/21/2015 1043   CALCIUM 9.2 12/27/2014 1315   ALKPHOS 94 06/21/2015 1043   ALKPHOS 83 09/26/2014 1406   AST 18 06/21/2015 1043   AST 15 09/26/2014 1406   ALT 16 06/21/2015 1043   ALT 13 09/26/2014 1406   BILITOT 0.49 06/21/2015 1043   BILITOT 0.7 09/26/2014 1406       No results found for: LABCA2  No components found for: LABCA125  No results for input(s): INR in the last 168 hours.  Urinalysis    Component Value Date/Time   COLORURINE AMBER* 02/21/2014 1540   APPEARANCEUR CLOUDY* 02/21/2014 1540   LABSPEC 1.029 02/21/2014 1540   PHURINE 5.0 02/21/2014 1540   GLUCOSEU NEGATIVE 02/21/2014 1540   HGBUR SMALL* 02/21/2014 1540   BILIRUBINUR SMALL* 02/21/2014 1540   KETONESUR 15* 02/21/2014 1540   PROTEINUR NEGATIVE 02/21/2014 1540   UROBILINOGEN 1.0 02/21/2014 1540   NITRITE POSITIVE* 02/21/2014 1540   LEUKOCYTESUR TRACE* 02/21/2014 1540    STUDIES: No results found.   ASSESSMENT: 55 y.o. [place] woman status post right lumpectomy  and sentinel lymph node sampling 08/27/2014 for a pT1c pN0, stage IA invasive ductal carcinoma, grade 3, estrogen and progesterone receptor positive, HER-2 negative, with an MIB-1 of 66%  (1) adjuvant chemotherapy with cyclophosphamide and docetaxel every 21 days 4, with onpro support, completed 11/26/2014  (2) adjuvant radiation 01/17/2015-03/06/2015: Right breast 50.4 gray in 28 fractions, lumpectomy cavity boost 12 gray in 6 fractions  (3) anastrozole: to start 04/16/2015  (a) bone density scan 01/24/2015 shows osteopenia with a T score of -1.7.  (4) genetics panel negative for any mutations in the following genes: APC, ATM, AXIN2, BARD1, BMPR1A, BRCA1, BRCA2, BRIP1, CDH1,  CDK4, CDKN2A, CHEK2, EPCAM, FANCC, MLH1, MSH2, MSH6, MUTYH, NBN, PALB2, PMS2, POLD1, POLE, PTEN, RAD51C, RAD51D, SCG5/GREM1, SMAD4, STK11, TP53, VHL, and XRCC2.   (5) R LE DVT documented 03/20/2014 following right sacroiliac joint effusion 02/22/2014  (a) hypercoagulable panel normal  (b) received Rivaroxaban for 6 months, completed April 2016   (c) right lower extremity venous Doppler 04/02/2015 was read as suspicious for a small nonocclusive thrombus in the right distal femoral vein. This is felt to be a very low thrombus pertinent with preserved phasic flow and augmentation.  (d) rivaroxaban resumed October 2016--to be continued indefinitely  (6) obesity:   PLAN: Teresa Aguilar is doing fine from a breast cancer point of view, nownearly a year out from her definitive surgery, with no evidence of disease recurrence.  She is tolerating the anastrozole moderately well and she does have significant hot flashes. We'll going to start by working on the nighttime hot flashes with gabapentin. She will take this at bedtime. If he doesn't take care of the problem or if it causes her any daytime grogginess she will let us know.  At the next visit we will consider adding venlafaxine.   She will be due for her next set of mammograms in March and that order has been placed.  She will be now on lifelong anticoagulation, she tells me, and I alerted her to avoiding any risk of hitting her head, which could cause fatal bleeding inside the skull. If she develops any other cuts she understands to put pressure for 5-10 minutes to give time for a clot to form.  She knows to call for any problems that may develop before her next visit here Teresa Cruel, MD   07/02/2015 2:03 PM

## 2015-07-02 NOTE — Telephone Encounter (Signed)
Gave  Patient avs report and appointments for April and mammo in March.

## 2015-07-04 ENCOUNTER — Other Ambulatory Visit: Payer: Self-pay | Admitting: Obstetrics and Gynecology

## 2015-07-08 LAB — CYTOLOGY - PAP

## 2015-08-27 ENCOUNTER — Ambulatory Visit
Admission: RE | Admit: 2015-08-27 | Discharge: 2015-08-27 | Disposition: A | Discharge status: Home / Self Care | Payer: BC Managed Care – PPO | Source: Ambulatory Visit | Attending: Oncology | Admitting: Oncology

## 2015-08-27 DIAGNOSIS — I82491 Acute embolism and thrombosis of other specified deep vein of right lower extremity: Secondary | ICD-10-CM

## 2015-08-27 DIAGNOSIS — C50311 Malignant neoplasm of lower-inner quadrant of right female breast: Secondary | ICD-10-CM

## 2015-09-03 ENCOUNTER — Ambulatory Visit: Payer: BC Managed Care – PPO | Admitting: Nurse Practitioner

## 2015-09-03 ENCOUNTER — Other Ambulatory Visit: Payer: BC Managed Care – PPO

## 2015-09-18 ENCOUNTER — Ambulatory Visit
Admission: RE | Admit: 2015-09-18 | Discharge: 2015-09-18 | Disposition: A | Discharge status: Home / Self Care | Payer: BC Managed Care – PPO | Source: Ambulatory Visit | Attending: Radiation Oncology | Admitting: Radiation Oncology

## 2015-09-18 ENCOUNTER — Encounter: Payer: Self-pay | Admitting: Radiation Oncology

## 2015-09-18 VITALS — BP 155/76 | HR 86 | Temp 98.0°F | Ht 68.0 in | Wt 272.5 lb

## 2015-09-18 DIAGNOSIS — C50311 Malignant neoplasm of lower-inner quadrant of right female breast: Secondary | ICD-10-CM

## 2015-09-18 NOTE — Progress Notes (Signed)
Radiation Oncology         [phone] ________________________________  Name: Teresa Aguilar MRN: [id number]  Date: 09/18/2015  DOB: 12-03-1960  Follow-Up Visit Note  CC: Teresa Shire, MD  Magrinat, [name], MD  No diagnosis found.  Diagnosis: pT1c pN0, Stage IA invasive ductal carcinoma of the right female breast, grade 3, ER/PR positive, HER2 negative, Ki-67 66%  Interval Since Last Radiation:  6  months   01/17/2015-03/06/2015: Right breast 50.4 gray in 28 fractions, lumpectomy cavity boost 12 gray in 6 fractions  Narrative:  The patient returns today for routine her follow-up with radiation oncology. She started Anastrozole in November 2016. Screening bilateral mammogram on 08/27/15 was negative for metastasis. She saw Dr. Jana Hakim in January.  ROS: She reports having soreness and tenderness in her right breast, especially at night when she rolls on her right side. She reports having memory issues and feels "foggy." She reports her energy level is still low. She reports "pretty bad" hot flashes that disrupt her sleep. She has a prescription for this.   ALLERGIES:  is allergic to morphine and related; sulfa antibiotics; and meloxicam.  Meds: Current Outpatient Prescriptions  Medication Sig Dispense Refill  . anastrozole (ARIMIDEX) 1 MG tablet Take 1 tablet (1 mg total) by mouth daily. 90 tablet 4  . cholecalciferol (VITAMIN D) 400 units TABS tablet Take 400 Units by mouth.    . eletriptan (RELPAX) 40 MG tablet Take 40 mg by mouth as needed. May repeat in 2 hours if headache persists or recurs.    . rivaroxaban (XARELTO) 10 MG TABS tablet Take 10 mg by mouth daily.    Marland Kitchen gabapentin (NEURONTIN) 300 MG capsule Take 1 capsule (300 mg total) by mouth at bedtime. (Patient not taking: Reported on 09/18/2015) 90 capsule 4   No current facility-administered medications for this encounter.    Physical Findings: The patient is in no acute distress. Patient is alert and oriented.  height is _0  (1.727 m) and weight is 272 lb 8 oz (123.605 kg). Her oral temperature is 98 F (36.7 C). Her blood pressure is 155/76 and her pulse is 86.   The left breast large and pendulous with no nipple discharge or bleeding. The right breast shows some mild hyperpigmentation changes. Mild induration in the medial aspect of the breast where she notices some tenderness. No dominant mass noted in the breast. No nipple discharge or bleeding. Lumpectomy scar in the lower inner quadrant is well healed with no sign of recurrence.  Lab Findings: Lab Results  Component Value Date   WBC 3.9 06/21/2015   HGB 14.2 06/21/2015   HCT 42.2 06/21/2015   MCV 90.4 06/21/2015   PLT 106* 06/21/2015    Radiographic Findings: Mm Diag Breast Tomo Bilateral  08/27/2015  CLINICAL DATA:  History of lumpectomy of the right breast in 2016. Follow-up evaluation. EXAM: DIGITAL DIAGNOSTIC BILATERAL MAMMOGRAM WITH 3D TOMOSYNTHESIS AND CAD COMPARISON:  Previous examinations the most recent which is dated 08/24/2014. ACR Breast Density Category b: There are scattered areas of fibroglandular density. FINDINGS: There are mild post lumpectomy scarring changes located within the lower inner quadrant of the right breast. There are mild post radiation changes within the right breast. The left breast stable. There are no findings worrisome recurrent tumor or developing malignancy within either breast. Mammographic images were processed with CAD. IMPRESSION: No findings worrisome for recurrent tumor or developing malignancy. RECOMMENDATION: Bilateral diagnostic mammography in 1 year. I have discussed the findings and  recommendations with the patient. Results were also provided in writing at the conclusion of the visit. If applicable, a reminder letter will be sent to the patient regarding the next appointment. BI-RADS CATEGORY  1: Negative. Electronically Signed   By: Altamese Cabal M.D.   On: 08/27/2015 11:01    Impression:   [name] is a 55 year old female presenting to clinic in regards to her pT1c pN0, stage IA invasive ductal carcinoma of the right female breast, grade 3, ER/PR positive, HER2 negative, Ki-67 66%. The patient is recovering from the effects of radiation. No signs of recurrence on clinical exam  Plan: She is scheduled to follow up with Gentry Fitz, NP on 10/01/15. sHe will contnue following up with medical oncology since she will be on Arimadex for the next 10 years. The patient will see me on a PRN basis.  -----------------------------------  Blair Promise, PhD, MD  This document serves as a record of services personally performed by [name], MD. It was created on his behalf by Darcus Austin, a trained medical scribe. The creation of this record is based on the scribe's personal observations and the provider's statements to them. This document has been checked and approved by the attending provider.

## 2015-09-18 NOTE — Progress Notes (Signed)
Einar Grad here for follow up.  She reports having soreness in her right breast especially at night when she rolls on her right side.  She is taking Arimidex.  She reports having memory issues and feels "foggy."  She reports her energy level is still down.  The skin on her right breast is intact.  She had a mammogram on 08/30/15.  BP 155/76 mmHg  Pulse 86  Temp(Src) 98 F (36.7 C) (Oral)  Ht 5\' 8"  (1.727 m)  Wt 272 lb 8 oz (123.605 kg)  BMI 41.44 kg/m2

## 2015-09-19 ENCOUNTER — Ambulatory Visit: Payer: BC Managed Care – PPO | Admitting: Radiation Oncology

## 2015-09-30 ENCOUNTER — Other Ambulatory Visit: Payer: Self-pay | Admitting: *Deleted

## 2015-09-30 DIAGNOSIS — C50311 Malignant neoplasm of lower-inner quadrant of right female breast: Secondary | ICD-10-CM

## 2015-10-01 ENCOUNTER — Encounter: Payer: Self-pay | Admitting: Nurse Practitioner

## 2015-10-01 ENCOUNTER — Ambulatory Visit (HOSPITAL_BASED_OUTPATIENT_CLINIC_OR_DEPARTMENT_OTHER): Payer: BC Managed Care – PPO | Admitting: Nurse Practitioner

## 2015-10-01 ENCOUNTER — Other Ambulatory Visit (HOSPITAL_BASED_OUTPATIENT_CLINIC_OR_DEPARTMENT_OTHER): Payer: BC Managed Care – PPO

## 2015-10-01 ENCOUNTER — Telehealth: Payer: Self-pay | Admitting: Oncology

## 2015-10-01 VITALS — BP 136/83 | HR 90 | Temp 97.8°F | Resp 19 | Ht 68.0 in | Wt 271.9 lb

## 2015-10-01 DIAGNOSIS — R4189 Other symptoms and signs involving cognitive functions and awareness: Secondary | ICD-10-CM

## 2015-10-01 DIAGNOSIS — C50311 Malignant neoplasm of lower-inner quadrant of right female breast: Secondary | ICD-10-CM

## 2015-10-01 DIAGNOSIS — N951 Menopausal and female climacteric states: Secondary | ICD-10-CM

## 2015-10-01 DIAGNOSIS — R12 Heartburn: Secondary | ICD-10-CM

## 2015-10-01 DIAGNOSIS — R5382 Chronic fatigue, unspecified: Secondary | ICD-10-CM

## 2015-10-01 DIAGNOSIS — R232 Flushing: Secondary | ICD-10-CM

## 2015-10-01 DIAGNOSIS — Z79811 Long term (current) use of aromatase inhibitors: Secondary | ICD-10-CM

## 2015-10-01 LAB — CBC WITH DIFFERENTIAL/PLATELET
BASO%: 0.5 % (ref 0.0–2.0)
Basophils Absolute: 0 10*3/uL (ref 0.0–0.1)
EOS%: 1.5 % (ref 0.0–7.0)
Eosinophils Absolute: 0.1 10*3/uL (ref 0.0–0.5)
HCT: 42.2 % (ref 34.8–46.6)
HGB: 13.8 g/dL (ref 11.6–15.9)
LYMPH%: 19.9 % (ref 14.0–49.7)
MCH: 30.6 pg (ref 25.1–34.0)
MCHC: 32.8 g/dL (ref 31.5–36.0)
MCV: 93.3 fL (ref 79.5–101.0)
MONO#: 0.3 10*3/uL (ref 0.1–0.9)
MONO%: 7.5 % (ref 0.0–14.0)
NEUT%: 70.6 % (ref 38.4–76.8)
NEUTROS ABS: 3.1 10*3/uL (ref 1.5–6.5)
PLATELETS: 122 10*3/uL — AB (ref 145–400)
RBC: 4.52 10*6/uL (ref 3.70–5.45)
RDW: 14.5 % (ref 11.2–14.5)
WBC: 4.3 10*3/uL (ref 3.9–10.3)
lymph#: 0.9 10*3/uL (ref 0.9–3.3)

## 2015-10-01 LAB — COMPREHENSIVE METABOLIC PANEL
ALT: 14 U/L (ref 0–55)
ANION GAP: 8 meq/L (ref 3–11)
AST: 15 U/L (ref 5–34)
Albumin: 3.7 g/dL (ref 3.5–5.0)
Alkaline Phosphatase: 87 U/L (ref 40–150)
BUN: 14.9 mg/dL (ref 7.0–26.0)
CHLORIDE: 107 meq/L (ref 98–109)
CO2: 26 meq/L (ref 22–29)
Calcium: 9.4 mg/dL (ref 8.4–10.4)
Creatinine: 0.8 mg/dL (ref 0.6–1.1)
EGFR: 77 mL/min/{1.73_m2} — AB (ref >90)
GLUCOSE: 183 mg/dL — AB (ref 70–140)
Potassium: 4.2 mEq/L (ref 3.5–5.1)
SODIUM: 141 meq/L (ref 136–145)
TOTAL PROTEIN: 7 g/dL (ref 6.4–8.3)
Total Bilirubin: 0.49 mg/dL (ref 0.20–1.20)

## 2015-10-01 MED ORDER — OMEPRAZOLE 40 MG PO CPDR: 40.0000 mg | DELAYED_RELEASE_CAPSULE | Freq: Every day | ORAL | Status: DC

## 2015-10-01 MED ORDER — CLONIDINE HCL 0.1 MG/24HR TD PTWK: 0.1000 mg | MEDICATED_PATCH | TRANSDERMAL | Status: DC

## 2015-10-01 NOTE — Telephone Encounter (Signed)
Gave patient avs report and appointments for August.  °

## 2015-10-01 NOTE — Progress Notes (Signed)
[facility]  [phone] [phone]     ID: [facility] DOB: 1960-07-14  MR#: [id number]  PHX#:[id number]  Patient Care Team: Stephens Shire, MD as PCP - General (Family Medicine) [name], MD as Consulting Physician (General Surgery) Chauncey Cruel, MD as Consulting Physician (Oncology) [name], MD as Consulting Physician (Radiation Oncology) [name], NP as Nurse Practitioner (Hematology and Oncology) PCP: Stephens Shire, MD GYN: [name] MD SU: [name] MD OTHER MD: [name] MD, Phylliss Bob MD, Jamie Kato MD  CHIEF COMPLAINT: Estrogen receptor positive breast cancer  CURRENT TREATMENT: anastrozole  BREAST CANCER HISTORY:   from the original intake note:  "[facility]" had routine screening mammography January 2016 in Dr. Tonette Bihari office showing a possible change in the right breast. On 07/24/2014 at the breast Center she underwent right digital mammography and ultrasonography. The breast density was category A. In the lower inner quadrant of the right breast there was a 2 cm mass which was palpable by exam. Ultrasound confirmed a 1.5 cm irregular hypoechoic mass at the 3:30 o'clock position 12 cm from the nipple. There was no right axillary adenopathy.  Biopsy of the mass in question 07/24/2014 showed ([id number]) invasive ductal carcinoma, grade 2 or 3, estrogen receptor 98% positive, progesterone receptor 76% positive, both with strong staining intensity, with an MIB-1 of 66% and no HER-2 amplification by FISH.  The patient's case was discussed at the multidisciplinary breast cancer conference 08/08/2014 and it was felt the patient would benefit from breast conserving surgery and likely would need an Oncotype to decide on optimal systemic therapy. She would need radiation and hormones. The question of genetics counseling was also raised.  On 08/27/2014 the patient underwent right  lumpectomy and sentinel lymph node sampling. The final pathology ([id number]) confirmed invasive ductal carcinoma, grade 3, measuring 1.9 cm. There was evidence of lymphovascular and perineural invasion. Margins were negative but close, the closest margin for the in situ component being 1 mm. The single sentinel lymph node was negative. Repeat HER-2 was again negative, with a signals ratio of 0.97 and a number per cell of 1.60.  The patient's subsequent history is as detailed below  INTERVAL HISTORY: [facility] returns today for follow up of her estrogen receptor positive breast cancer, accompanied by her husband. She continues on anastrozole daily. Her main complaint with this drug are drenching hot flashes. She was prescribed gabapentin but this made her feel groggy so she stopped taking it. She denies vaginal changes. The interval history is generally unremarkable.  REVIEW OF SYSTEMS: [facility] denies fevers or chills. She has weekly mid sternal gastric discomfort. She gets full quickly. She denies nausea or vomiting. She has occasional diarrhea that resolves on its own. She continues to have chronic back pain and stenosis. Next week she anticipates an epidural procedure. She is chronically fatigued. She is short of breath with exertion, but denies chest pain, cough, or palpitations. She has a history of regulars migraines. She complains of memory loss and trouble finding her words. A detailed review of systems is otherwise stable.   PAST MEDICAL HISTORY: Past Medical History  Diagnosis Date  . Hypoxia     after surgery  . DVT (deep venous thrombosis) ([facility]) 03/2014    R leg, post op- on Xarelto   . Hypertension     pt. reports that she has been higher in the past & again now but doesn't take any med.,never has for ^BP,  pt. stating that BP is high now b/c of her pain in her back.    . H/O cardiovascular stress test 2012     test done for stress from her Father dying, had chest pain ,pt. had a  stress/echo  test, told it was wnl  . Anxiety     relative for to pain & frustration of prev. hospitalization   . Gestational diabetes 1986; 1996  . Migraines     "at least q other week" (08/27/2014)  . Arthritis     degenerative lumbar spine    . Chronic back pain   . Breast cancer of lower-inner quadrant of right female breast ([facility])   . Colon cancer ([facility])   . Breast cancer ([facility]) 2016  . Family history of breast cancer   . Family history of colon cancer   . PONV (postoperative nausea and vomiting)     low O2 sats, < 50% post op  . Family history of adverse reaction to anesthesia     PONV  . GERD (gastroesophageal reflux disease)     used during chemo, has not taken recently  . Radiation 01/17/15-03/06/15    right breast 50.4 gray, lumpectomy cavity boost to 12 gray    PAST SURGICAL HISTORY: Past Surgical History  Procedure Laterality Date  . Endometrial ablation  ~ 1998  . Colonoscopy w/ polypectomy  2015    found tubulovillous adenoma with focal high grade displasia  . Plantar fascia release Right ~ 2000  . Eye muscle surgery Bilateral ~ 1965    to fix cross-eye as child  . Sacroiliac joint fusion Right 02/22/2014    Procedure: SACROILIAC JOINT FUSION;  Surgeon: Sinclair Ship, MD;  Location: [facility];  Service: Orthopedics;  Laterality: Right;  Right sided sacroiliac joint fusion  . Radiofrequency ablation nerves      x 2 to nerves in her lower back  . Dental surgery  ~ 2011    "replaced bone graft upper jaw; put 2 implants in"  . Tubal ligation  1996  . Axillary sentinel node biopsy Right 08/27/2014    Archie Endo 08/27/2014  . Tonsillectomy  1988  . Excisional hemorrhoidectomy  2015  . Back surgery    . Breast biopsy Right 07/24/2014    core biopsy  . Breast lumpectomy Right 08/27/2014    Archie Endo 08/27/2014  . Radioactive seed guided mastectomy with axillary sentinel lymph node biopsy Right 08/27/2014    Procedure: RADIOACTIVE SEED GUIDED RIGHT BREAST LUMPECTOMY WITH RIGHT  AXILLARY SENTINEL LYMPH NODE BIOPSY;  Surgeon: [name], MD;  Location: [facility];  Service: General;  Laterality: Right;  . Portacath placement N/A 09/19/2014    Procedure: INSERTION PORT-A-CATH;  Surgeon: [name], MD;  Location: [facility];  Service: General;  Laterality: N/A;  . Port-a-cath removal Right 12/31/2014    Procedure: REMOVAL PORT-A-CATH;  Surgeon: [name], MD;  Location: [facility];  Service: General;  Laterality: Right;    FAMILY HISTORY Family History  Problem Relation Age of Onset  . Breast cancer Cousin 93    maternal first cousin  . Colon cancer Maternal Aunt 75  . Throat cancer Maternal Aunt 75    smoker  . Breast cancer Paternal Aunt 29  . Heart disease Mother   . Heart disease Father   . Diabetes type II Father   . Stomach cancer Maternal Uncle     dx in his 47s  . Colon cancer Cousin 55     maternal first cousin  .  Prostate cancer Cousin 20    paternal first cousin  . Breast cancer Paternal Aunt     dx in her 53s  . Breast cancer Other     mother's paternal first cousin  . Colon cancer Other 42    MGF's sister  . Brain cancer Other 49    MGFs sister  . Breast cancer Other     MGF's paternal aunt  . Prostate cancer Other     MGF's paternal first cousin   the patient's father died at the age of 39 from heart failure in the setting of diabetes. The patient's mother is currently 27 years old. The patient has 3 brothers, 2 sisters. There is no history of breast, ovarian, or colon cancer in first-degree relatives. However, on the mother's side the patient has 3 relatives with breast cancer (a cousin diagnosed age 27, a second cousin diagnosed age 71, and a great aunt). There is also a history of colon cancer diagnosed age around age 70 in a great aunt and small intestinal cancer in an aunt diagnosed age 23. On the father's side there are 2 cousins with breast cancer diagnosed age 81 and 50, and a cousin with colon cancer diagnosed age  31  GYNECOLOGIC HISTORY:  No LMP recorded. Patient has had an ablation. Menarche age 89, the patient carried 2 children to term, the first at age 49. After the first live birth she had a premature girl who died after one day (she had been a breech birth) and she also had 3 miscarriages. The patient underwent endometrial ablation in 1998 and has had no periods since that time.  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 Her husband Braulio Conte works for [employer] in the [place] and Manufacturing systems engineer. Son Brocha Gilliam lives in [place] and works in Education officer, community. He was an Gaffer) and son Dhanya Bogle is 81 and works for [employer]. He also lives in [place]. The patient has one granddaughter. The patient is a Psychologist, forensic    ADVANCED DIRECTIVES: Not in place   HEALTH MAINTENANCE: Social History  Substance Use Topics  . Smoking status: Never Smoker   . Smokeless tobacco: Never Used  . Alcohol Use: No     Colonoscopy: June 2015/May and last repeat planned 10/03/2014  PAP: 2015  Bone density:  Lipid panel:  Allergies  Allergen Reactions  . Morphine And Related Nausea And Vomiting  . Sulfa Antibiotics Hives  . Meloxicam Rash    Current Outpatient Prescriptions  Medication Sig Dispense Refill  . anastrozole (ARIMIDEX) 1 MG tablet Take 1 tablet (1 mg total) by mouth daily. 90 tablet 4  . cholecalciferol (VITAMIN D) 400 units TABS tablet Take 400 Units by mouth.    . eletriptan (RELPAX) 40 MG tablet Take 40 mg by mouth as needed. Reported on 10/01/2015    . rivaroxaban (XARELTO) 10 MG TABS tablet Take 10 mg by mouth daily.    . cloNIDine (CATAPRES - DOSED IN MG/24 HR) 0.1 mg/24hr patch Place 1 patch (0.1 mg total) onto the skin once a week. 4 patch 11  . gabapentin (NEURONTIN) 300 MG capsule Take 1 capsule (300 mg total) by mouth at bedtime. (Patient not taking: Reported on  09/18/2015) 90 capsule 4  . omeprazole (PRILOSEC) 40 MG capsule Take 1 capsule (40 mg total) by mouth daily. 30 capsule 6   No current facility-administered medications for this visit.  OBJECTIVE: Middle-aged white woman in no acute distress Filed Vitals:   10/01/15 1345  BP: 136/83  Pulse: 90  Temp: 97.8 F (36.6 C)  Resp: 19     Body mass index is 41.35 kg/(m^2).    ECOG FS:1 - Symptomatic but completely ambulatory Filed Weights   10/01/15 1345  Weight: 271 lb 14.4 oz (123.333 kg)    Skin: warm, dry  HEENT: sclerae anicteric, conjunctivae pink, oropharynx clear. No thrush or mucositis.  Lymph Nodes: No cervical or supraclavicular lymphadenopathy  Lungs: clear to auscultation bilaterally, no rales, wheezes, or rhonci  Heart: regular rate and rhythm  Abdomen: round, obese, non tender, positive bowel sounds  Musculoskeletal: No focal spinal tenderness, no peripheral edema  Neuro: non focal, well oriented, positive affect  Breasts; right breast status post lumpectomy and radiation. No evidence of recurrent disease. Right axilla benign. Left breast unremarkable.  LAB RESULTS:  CMP     Component Value Date/Time   NA 141 10/01/2015 1327   NA 141 12/27/2014 1315   K 4.2 10/01/2015 1327   K 3.8 12/27/2014 1315   CL 109 12/27/2014 1315   CO2 26 10/01/2015 1327   CO2 24 12/27/2014 1315   GLUCOSE 183* 10/01/2015 1327   GLUCOSE 138* 12/27/2014 1315   BUN 14.9 10/01/2015 1327   BUN 12 12/27/2014 1315   CREATININE 0.8 10/01/2015 1327   CREATININE 0.77 12/27/2014 1315   CALCIUM 9.4 10/01/2015 1327   CALCIUM 9.2 12/27/2014 1315   PROT 7.0 10/01/2015 1327   PROT 6.4 09/26/2014 1406   ALBUMIN 3.7 10/01/2015 1327   ALBUMIN 3.9 09/26/2014 1406   AST 15 10/01/2015 1327   AST 15 09/26/2014 1406   ALT 14 10/01/2015 1327   ALT 13 09/26/2014 1406   ALKPHOS 87 10/01/2015 1327   ALKPHOS 83 09/26/2014 1406   BILITOT 0.49 10/01/2015 1327   BILITOT 0.7 09/26/2014 1406   GFRNONAA >60  12/27/2014 1315   GFRAA >60 12/27/2014 1315    INo results found for: SPEP, UPEP  Lab Results  Component Value Date   WBC 4.3 10/01/2015   NEUTROABS 3.1 10/01/2015   HGB 13.8 10/01/2015   HCT 42.2 10/01/2015   MCV 93.3 10/01/2015   PLT 122* 10/01/2015      Chemistry      Component Value Date/Time   NA 141 10/01/2015 1327   NA 141 12/27/2014 1315   K 4.2 10/01/2015 1327   K 3.8 12/27/2014 1315   CL 109 12/27/2014 1315   CO2 26 10/01/2015 1327   CO2 24 12/27/2014 1315   BUN 14.9 10/01/2015 1327   BUN 12 12/27/2014 1315   CREATININE 0.8 10/01/2015 1327   CREATININE 0.77 12/27/2014 1315      Component Value Date/Time   CALCIUM 9.4 10/01/2015 1327   CALCIUM 9.2 12/27/2014 1315   ALKPHOS 87 10/01/2015 1327   ALKPHOS 83 09/26/2014 1406   AST 15 10/01/2015 1327   AST 15 09/26/2014 1406   ALT 14 10/01/2015 1327   ALT 13 09/26/2014 1406   BILITOT 0.49 10/01/2015 1327   BILITOT 0.7 09/26/2014 1406       No results found for: LABCA2  No components found for: LABCA125  No results for input(s): INR in the last 168 hours.  Urinalysis    Component Value Date/Time   COLORURINE AMBER* 02/21/2014 1540   APPEARANCEUR CLOUDY* 02/21/2014 1540   LABSPEC 1.029 02/21/2014 1540   PHURINE 5.0 02/21/2014 1540   GLUCOSEU NEGATIVE 02/21/2014 1540  HGBUR SMALL* 02/21/2014 1540   BILIRUBINUR SMALL* 02/21/2014 1540   KETONESUR 15* 02/21/2014 1540   PROTEINUR NEGATIVE 02/21/2014 1540   UROBILINOGEN 1.0 02/21/2014 1540   NITRITE POSITIVE* 02/21/2014 1540   LEUKOCYTESUR TRACE* 02/21/2014 1540    STUDIES: No results found.   ASSESSMENT: 55 y.o. [place] woman status post right lumpectomy and sentinel lymph node sampling 08/27/2014 for a pT1c pN0, stage IA invasive ductal carcinoma, grade 3, estrogen and progesterone receptor positive, HER-2 negative, with an MIB-1 of 66%  (1) adjuvant chemotherapy with cyclophosphamide and docetaxel every 21 days 4, with onpro support,  completed 11/26/2014  (2) adjuvant radiation 01/17/2015-03/06/2015: Right breast 50.4 gray in 28 fractions, lumpectomy cavity boost 12 gray in 6 fractions  (3) anastrozole: to start 04/16/2015  (a) bone density scan 01/24/2015 shows osteopenia with a T score of -1.7.  (4) genetics panel negative for any mutations in the following genes: APC, ATM, AXIN2, BARD1, BMPR1A, BRCA1, BRCA2, BRIP1, CDH1, CDK4, CDKN2A, CHEK2, EPCAM, FANCC, MLH1, MSH2, MSH6, MUTYH, NBN, PALB2, PMS2, POLD1, POLE, PTEN, RAD51C, RAD51D, SCG5/GREM1, SMAD4, STK11, TP53, VHL, and XRCC2.   (5) R LE DVT documented 03/20/2014 following right sacroiliac joint effusion 02/22/2014  (a) hypercoagulable panel normal  (b) received Rivaroxaban for 6 months, completed April 2016   (c) right lower extremity venous Doppler 04/02/2015 was read as suspicious for a small nonocclusive thrombus in the right distal femoral vein. This is felt to be a very low thrombus pertinent with preserved phasic flow and augmentation.  (d) rivaroxaban resumed October 2016--to be continued indefinitely  (6) obesity:   PLAN: [facility] is stable today. Her most recent mammogram was negative. She is tolerating the anastrozole reasonably well. She would like help with her hot flashes. She did not tolerate the gabapentin well and she does not like the idea of venlafaxine. We discussed clonidine TTS-1 patches, and she was interested in giving this a try. She understands that this medicine is technically for blood pressure, but she tends to run on the high side anyway. She will change the patch weekly.   I encouraged her to begin a low impact exercise activity such as the recumbent bike or water aerobics. This might inspire more energy for her. She is thinking about gastric bypass surgery in the future as well. We are going to try omperazole for her current gastric upset and discomfort.  I offered her a visit to Dr. Valentina Shaggy for her lasting "chemo brain" symptoms, but she  declined.  [facility] will return in August for follow up with Dr. Jana Hakim. She understands and agrees with this plan. She knows the goal of treatment in her case is cure. She has been encouraged to call with any issues that might arise before her next visit here.   Laurie Panda, NP   10/01/2015 2:53 PM

## 2015-10-07 ENCOUNTER — Telehealth: Payer: Self-pay | Admitting: *Deleted

## 2015-10-07 NOTE — Telephone Encounter (Signed)
Called pt back concerning her question abt labs being released to [facility]. Pt has now seen results and has no further questions. All labs WNL. Message to be fwd to Gentry Fitz, NP.

## 2015-10-10 IMAGING — CR DG CHEST 2V
2 series · 2 of 2 positions shown · non-contrast
Comparison: None.

CLINICAL DATA: Preop for lumbar spine surgery

EXAM:
CHEST  2 VIEW

[w chest pa]
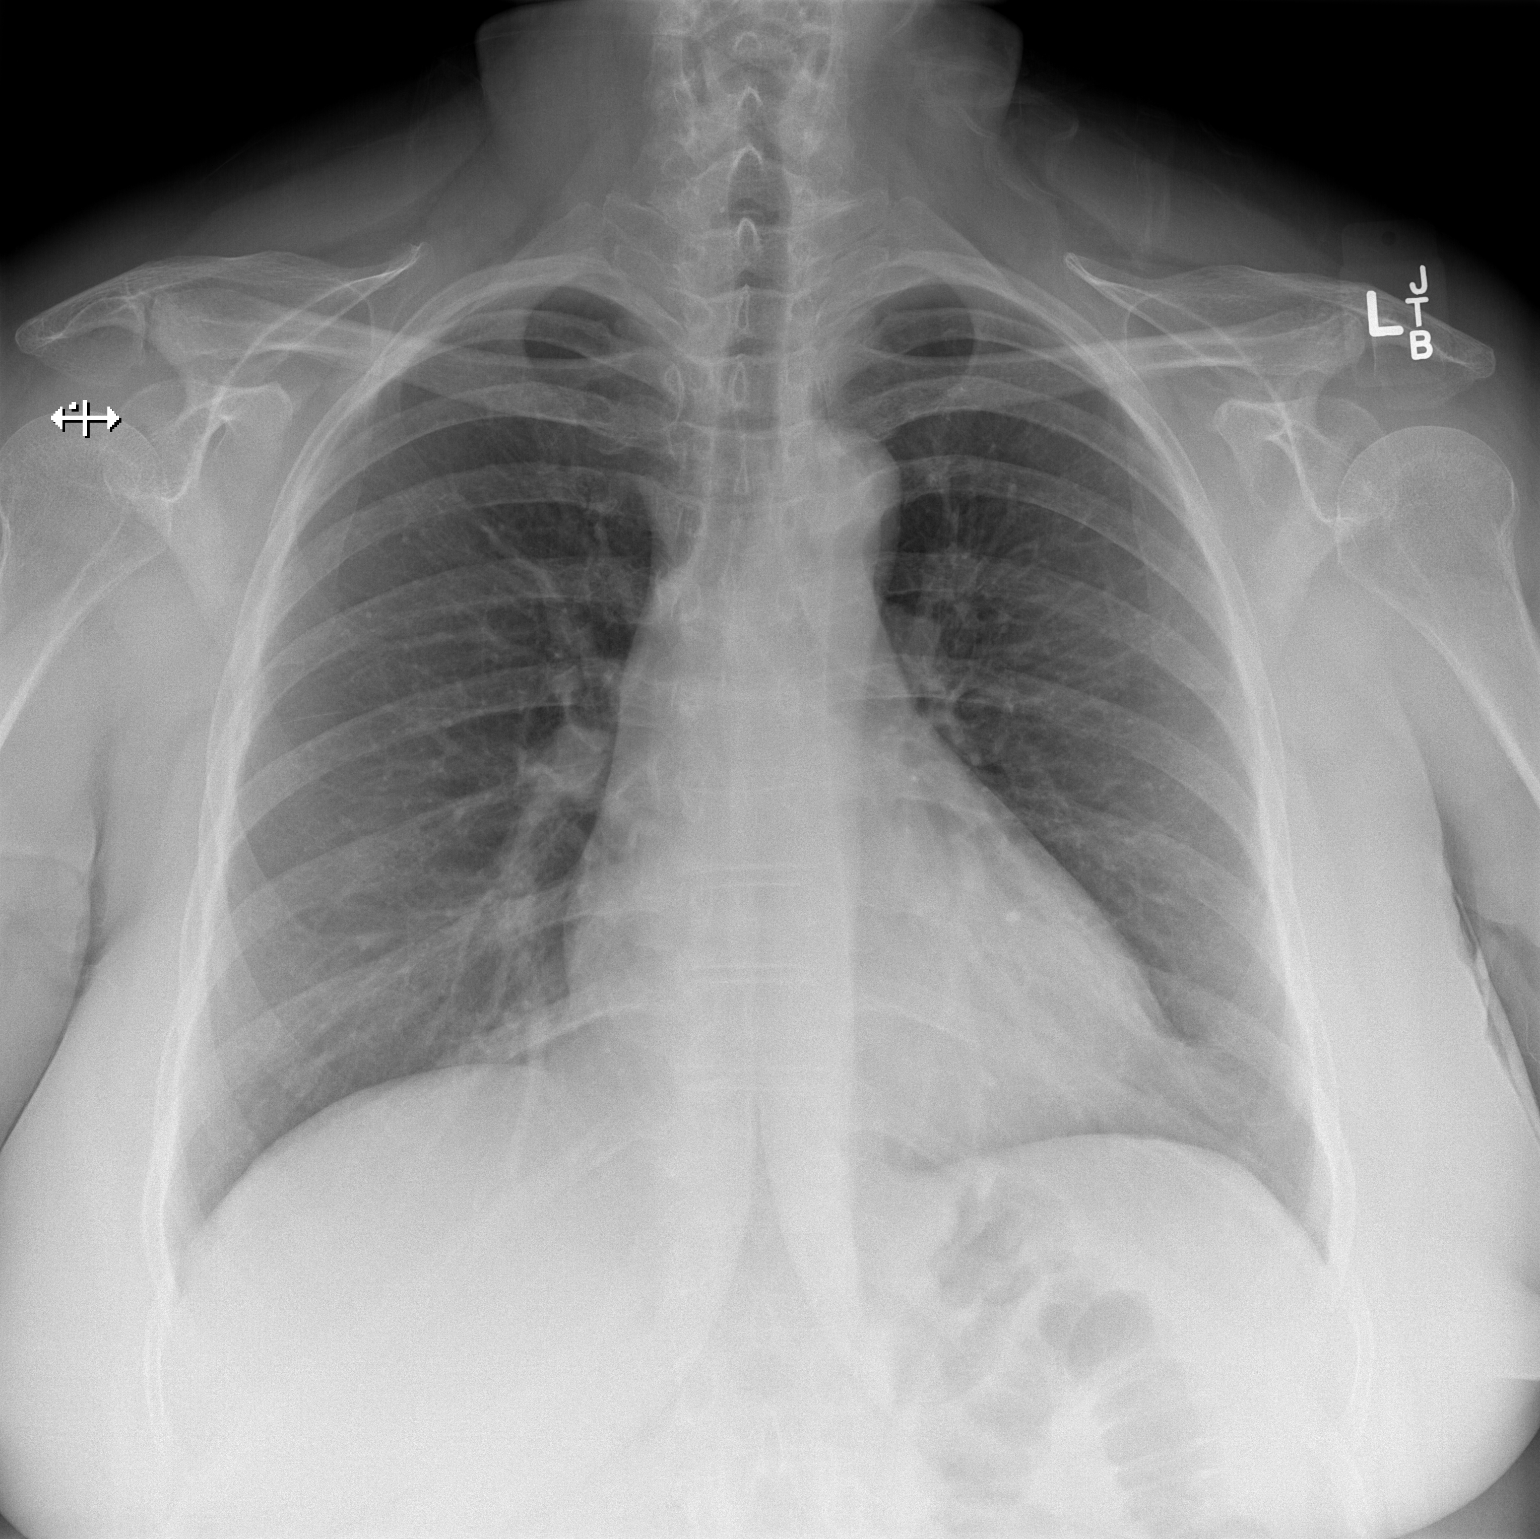

[w chest lat]
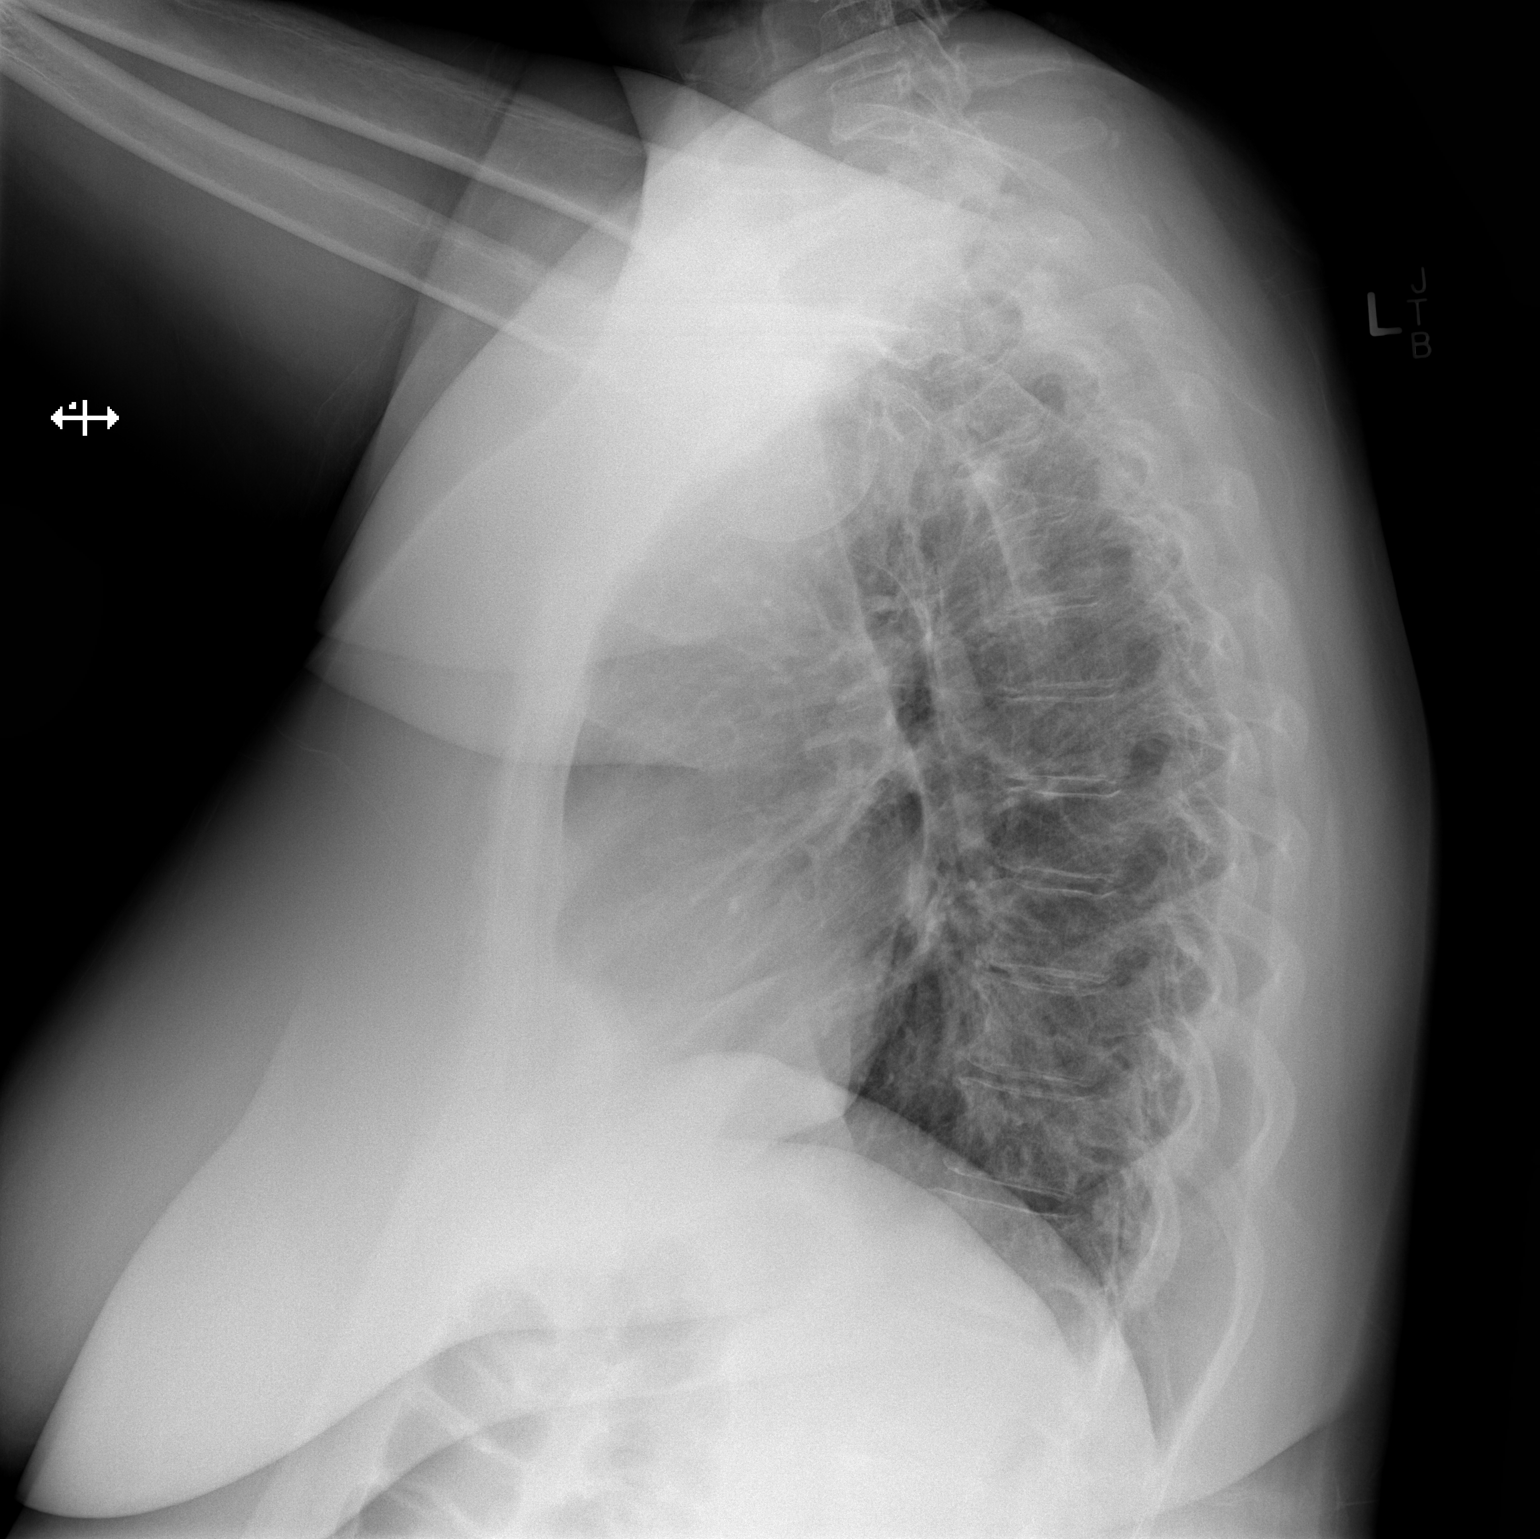

[2 of 2 positions shown; findings below may reference images not displayed]

FINDINGS: No active infiltrate or effusion is seen. Mediastinal and hilar
contours appear normal. The heart is within upper limits of normal.
No bony abnormality is seen.
IMPRESSION: No active cardiopulmonary disease.

## 2015-10-11 IMAGING — RF DG SI JOINTS 3+V
1 series · 3 of 3 positions shown · non-contrast
Comparison: None

CLINICAL DATA: Sacroiliac joint fusion

EXAM:
DG C-ARM 61-120 MIN; BILATERAL SACROILIAC JOINTS - 3+ VIEW

[Series 1: run · 3 of 3 slices shown]
[im 1/3]
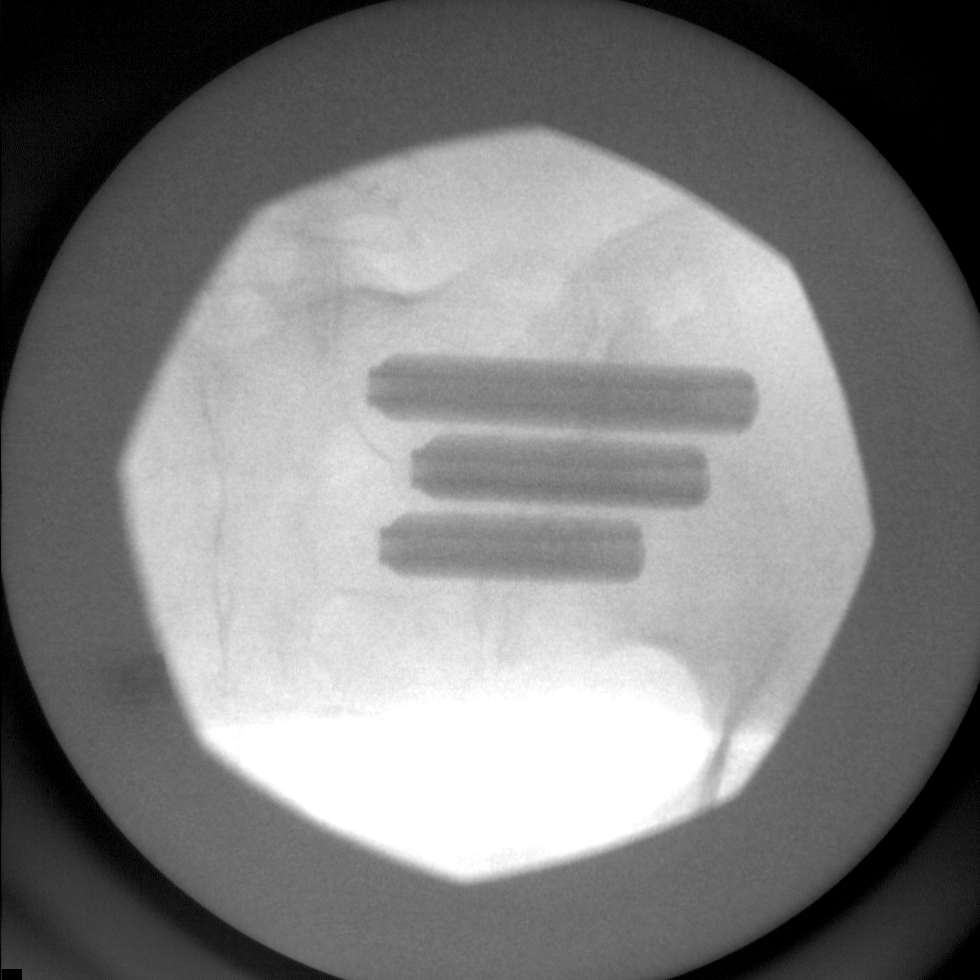
[im 2/3]
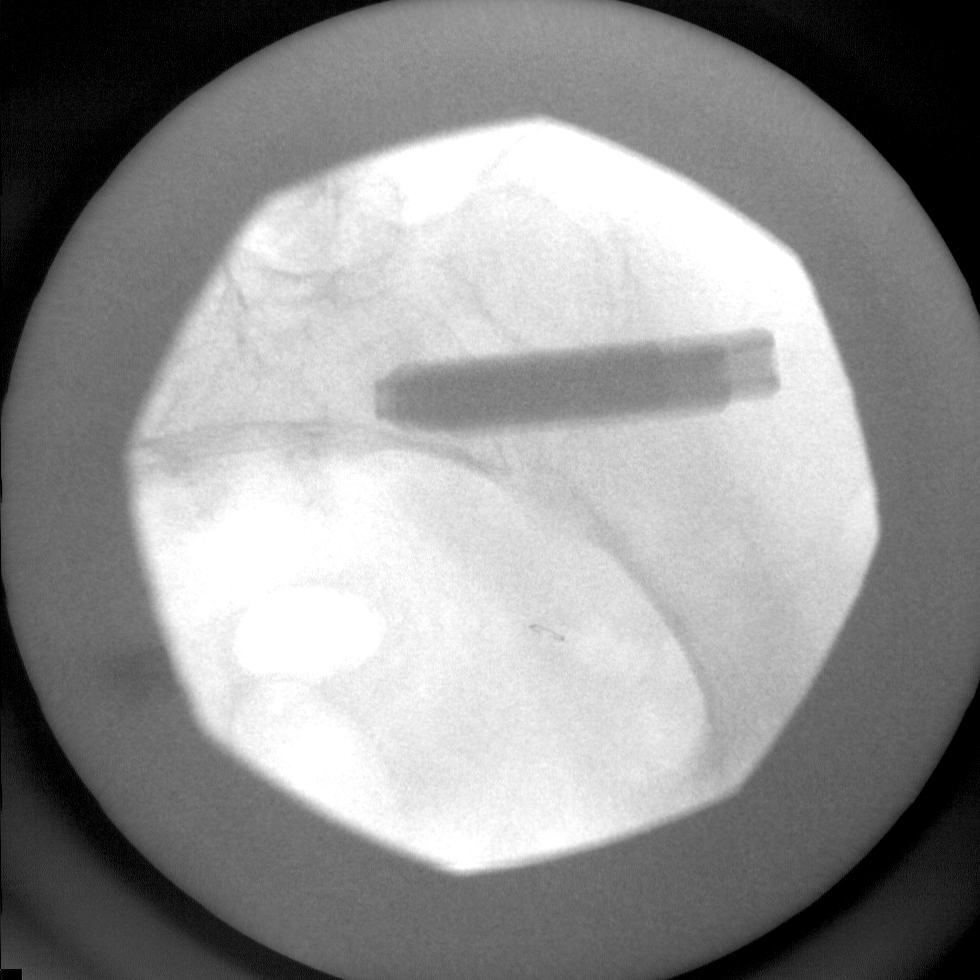
[im 3/3]
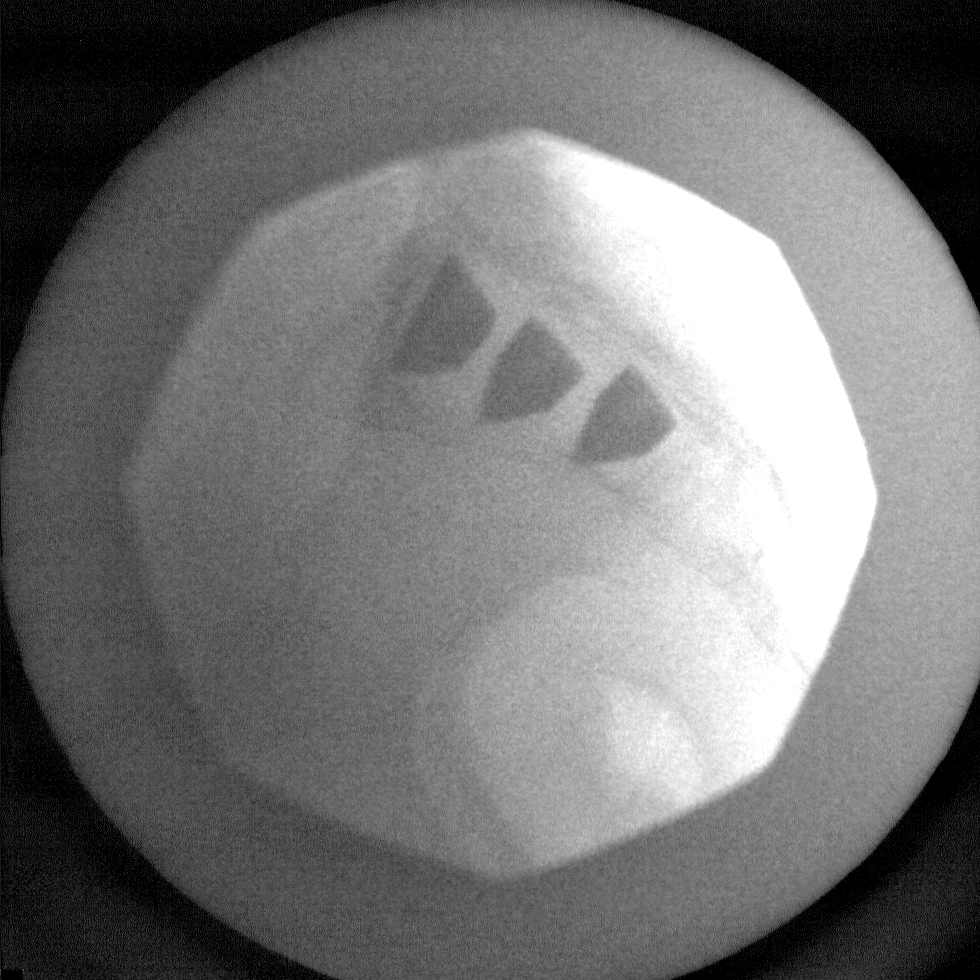

[3 of 3 positions shown; findings below may reference images not displayed]

FINDINGS: Frontal and lateral views were obtained. There are 3 metallic
devices fusing the right sacroiliac joint. These devices appear
intact. Alignment is anatomic. No fracture or diastases apparent.
IMPRESSION: Fusion device is in the right sacroiliac joint with alignment
anatomic.

## 2016-01-27 ENCOUNTER — Other Ambulatory Visit: Payer: Self-pay | Admitting: *Deleted

## 2016-01-27 DIAGNOSIS — C50311 Malignant neoplasm of lower-inner quadrant of right female breast: Secondary | ICD-10-CM

## 2016-01-28 ENCOUNTER — Other Ambulatory Visit (HOSPITAL_BASED_OUTPATIENT_CLINIC_OR_DEPARTMENT_OTHER): Payer: BC Managed Care – PPO

## 2016-01-28 ENCOUNTER — Ambulatory Visit (HOSPITAL_BASED_OUTPATIENT_CLINIC_OR_DEPARTMENT_OTHER): Payer: BC Managed Care – PPO | Admitting: Oncology

## 2016-01-28 ENCOUNTER — Telehealth: Payer: Self-pay | Admitting: Oncology

## 2016-01-28 VITALS — BP 128/91 | HR 85 | Temp 97.9°F | Resp 18 | Ht 68.0 in | Wt 273.8 lb

## 2016-01-28 DIAGNOSIS — C50311 Malignant neoplasm of lower-inner quadrant of right female breast: Secondary | ICD-10-CM | POA: Diagnosis not present

## 2016-01-28 DIAGNOSIS — M859 Disorder of bone density and structure, unspecified: Secondary | ICD-10-CM

## 2016-01-28 LAB — CBC WITH DIFFERENTIAL/PLATELET
BASO%: 0.2 % (ref 0.0–2.0)
BASOS ABS: 0 10*3/uL (ref 0.0–0.1)
EOS ABS: 0 10*3/uL (ref 0.0–0.5)
EOS%: 0.9 % (ref 0.0–7.0)
HEMATOCRIT: 42.6 % (ref 34.8–46.6)
HEMOGLOBIN: 14 g/dL (ref 11.6–15.9)
LYMPH%: 15.9 % (ref 14.0–49.7)
MCH: 30.5 pg (ref 25.1–34.0)
MCHC: 32.9 g/dL (ref 31.5–36.0)
MCV: 92.6 fL (ref 79.5–101.0)
MONO#: 0.4 10*3/uL (ref 0.1–0.9)
MONO%: 8.1 % (ref 0.0–14.0)
NEUT#: 4.1 10*3/uL (ref 1.5–6.5)
NEUT%: 74.9 % (ref 38.4–76.8)
PLATELETS: 122 10*3/uL — AB (ref 145–400)
RBC: 4.6 10*6/uL (ref 3.70–5.45)
RDW: 14.5 % (ref 11.2–14.5)
WBC: 5.4 10*3/uL (ref 3.9–10.3)
lymph#: 0.9 10*3/uL (ref 0.9–3.3)

## 2016-01-28 LAB — COMPREHENSIVE METABOLIC PANEL
ALBUMIN: 3.5 g/dL (ref 3.5–5.0)
ALK PHOS: 101 U/L (ref 40–150)
ALT: 25 U/L (ref 0–55)
ANION GAP: 10 meq/L (ref 3–11)
AST: 27 U/L (ref 5–34)
BILIRUBIN TOTAL: 0.49 mg/dL (ref 0.20–1.20)
BUN: 15.5 mg/dL (ref 7.0–26.0)
CALCIUM: 9.4 mg/dL (ref 8.4–10.4)
CO2: 26 mEq/L (ref 22–29)
Chloride: 106 mEq/L (ref 98–109)
Creatinine: 0.8 mg/dL (ref 0.6–1.1)
EGFR: 80 mL/min/{1.73_m2} — AB (ref >90)
Glucose: 214 mg/dl — ABNORMAL HIGH (ref 70–140)
POTASSIUM: 4.1 meq/L (ref 3.5–5.1)
SODIUM: 142 meq/L (ref 136–145)
Total Protein: 6.8 g/dL (ref 6.4–8.3)

## 2016-01-28 MED ORDER — ANASTROZOLE 1 MG PO TABS: 1.0000 mg | ORAL_TABLET | Freq: Every day | ORAL | 4 refills | Status: DC

## 2016-01-28 NOTE — Progress Notes (Signed)
[facility]  [phone] [phone]     ID: Teresa Aguilar DOB: 03-04-1961  MR#: [id number]  WGY#:[id number]  Patient Care Team: Stephens Shire, MD as PCP - General (Family Medicine) [name], MD as Consulting Physician (General Surgery) Chauncey Cruel, MD as Consulting Physician (Oncology) [name], MD as Consulting Physician (Radiation Oncology) [name], NP as Nurse Practitioner (Hematology and Oncology) PCP: Stephens Shire, MD GYN: Teresa Munro MD SU: [name] MD OTHER MD: [name] MD, Teresa Bob MD, Teresa Kato MD  CHIEF COMPLAINT: Estrogen receptor positive breast cancer  CURRENT TREATMENT: anastrozole, rivaroxaban  BREAST CANCER HISTORY:   from the original intake note:  "Teresa Aguilar" had routine screening mammography January 2016 in Dr. Tonette Bihari office showing a possible change in the right breast. On 07/24/2014 at the breast Center she underwent right digital mammography and ultrasonography. The breast density was category A. In the lower inner quadrant of the right breast there was a 2 cm mass which was palpable by exam. Ultrasound confirmed a 1.5 cm irregular hypoechoic mass at the 3:30 o'clock position 12 cm from the nipple. There was no right axillary adenopathy.  Biopsy of the mass in question 07/24/2014 showed ([id number]) invasive ductal carcinoma, grade 2 or 3, estrogen receptor 98% positive, progesterone receptor 76% positive, both with strong staining intensity, with an MIB-1 of 66% and no HER-2 amplification by FISH.  The patient's case was discussed at the multidisciplinary breast cancer conference 08/08/2014 and it was felt the patient would benefit from breast conserving surgery and likely would need an Oncotype to decide on optimal systemic therapy. She would need radiation and hormones. The question of genetics counseling was also raised.  On 08/27/2014 the patient underwent  right lumpectomy and sentinel lymph node sampling. The final pathology ([id number]) confirmed invasive ductal carcinoma, grade 3, measuring 1.9 cm. There was evidence of lymphovascular and perineural invasion. Margins were negative but close, the closest margin for the in situ component being 1 mm. The single sentinel lymph node was negative. Repeat HER-2 was again negative, with a signals ratio of 0.97 and a number per cell of 1.60.  The patient's subsequent history is as detailed below  INTERVAL HISTORY: Teresa Aguilar returns today for follow up of her estrogen receptor positive breast cancer. She tells me she is tolerating the anastrozole better. The hot flashes are still intense but they are less frequent. She has pain chiefly in her knees and hips. This also is improving. She obtains a drug at a good price.  She is also on rivaroxaban. She is now on the generic form. She pays approximately $5 a month for this. She has had no bleeding complications.  REVIEW OF SYSTEMS: Teresa Aguilar has severe back problems, and is considering possibly surgery at some point in the future. She is having pain management right now through ortho, "they are burning my nerves". That is helping some. She sleeps poorly, describes herself as mildly fatigued, short of breath particularly when walking up stairs, has diarrhea at times. Has stress urinary incontinence, and has rare headaches. A detailed review of systems today was otherwise stable   PAST MEDICAL HISTORY: Past Medical History:  Diagnosis Date  . Anxiety    relative for to pain & frustration of prev. hospitalization   . Arthritis    degenerative lumbar spine    . Breast cancer ([facility]) 2016  . Breast cancer of lower-inner quadrant of right female breast ([facility])   .  Chronic back pain   . Colon cancer ([facility])   . DVT (deep venous thrombosis) ([facility]) 03/2014   R leg, post op- on Xarelto   . Family history of adverse reaction to anesthesia    PONV  . Family history of  breast cancer   . Family history of colon cancer   . GERD (gastroesophageal reflux disease)    used during chemo, has not taken recently  . Gestational diabetes 1986; 1996  . H/O cardiovascular stress test 2012    test done for stress from her Father dying, had chest pain ,pt. had a stress/echo  test, told it was wnl  . Hypertension    pt. reports that she has been higher in the past & again now but doesn't take any med.,never has for ^BP, pt. stating that BP is high now b/c of her pain in her back.    . Hypoxia    after surgery  . Migraines    "at least q other week" (08/27/2014)  . PONV (postoperative nausea and vomiting)    low O2 sats, < 50% post op  . Radiation 01/17/15-03/06/15   right breast 50.4 gray, lumpectomy cavity boost to 12 gray    PAST SURGICAL HISTORY: Past Surgical History:  Procedure Laterality Date  . AXILLARY SENTINEL NODE BIOPSY Right 08/27/2014   [facility] Endo 08/27/2014  . BACK SURGERY    . BREAST BIOPSY Right 07/24/2014   core biopsy  . BREAST LUMPECTOMY Right 08/27/2014   Ar[facility] Endo 08/27/2014  . COLONOSCOPY W/ POLYPECTOMY  2015   found tubulovillous adenoma with focal high grade displasia  . DENTAL SURGERY  ~ 2011   "replaced bone graft upper jaw; put 2 implants in"  . ENDOMETRIAL ABLATION  ~ 1998  . EXCISIONAL HEMORRHOIDECTOMY  2015  . EYE MUSCLE SURGERY Bilateral ~ 1965   to fix cross-eye as child  . PLANTAR FASCIA RELEASE Right ~ 2000  . PORT-A-CATH REMOVAL Right 12/31/2014   Procedure: REMOVAL PORT-A-CATH;  Surgeon: [name], MD;  Location: [facility];  Service: General;  Laterality: Right;  . PORTACATH PLACEMENT N/A 09/19/2014   Procedure: INSERTION PORT-A-CATH;  Surgeon: [name], MD;  Location: [facility];  Service: General;  Laterality: N/A;  . RADIOACTIVE SEED GUIDED MASTECTOMY WITH AXILLARY SENTINEL LYMPH NODE BIOPSY Right 08/27/2014   Procedure: RADIOACTIVE SEED GUIDED RIGHT BREAST LUMPECTOMY WITH RIGHT AXILLARY SENTINEL LYMPH NODE BIOPSY;  Surgeon:  [name], MD;  Location: [facility];  Service: General;  Laterality: Right;  . RADIOFREQUENCY ABLATION NERVES     x 2 to nerves in her lower back  . SACROILIAC JOINT FUSION Right 02/22/2014   Procedure: SACROILIAC JOINT FUSION;  Surgeon: Sinclair Ship, MD;  Location: [facility];  Service: Orthopedics;  Laterality: Right;  Right sided sacroiliac joint fusion  . TONSILLECTOMY  1988  . TUBAL LIGATION  1996    FAMILY HISTORY Family History  Problem Relation Age of Onset  . Breast cancer Cousin 26    maternal first cousin  . Colon cancer Maternal Aunt 75  . Throat cancer Maternal Aunt 75    smoker  . Breast cancer Paternal Aunt 22  . Heart disease Mother   . Heart disease Father   . Diabetes type II Father   . Stomach cancer Maternal Uncle     dx in his 41s  . Colon cancer Cousin 60     maternal first cousin  . Prostate cancer Cousin 72    paternal first cousin  . Breast  cancer Paternal Aunt     dx in her 57s  . Breast cancer Other     mother's paternal first cousin  . Colon cancer Other 37    MGF's sister  . Brain cancer Other 37    MGFs sister  . Breast cancer Other     MGF's paternal aunt  . Prostate cancer Other     MGF's paternal first cousin   the patient's father died at the age of 64 from heart failure in the setting of diabetes. The patient's mother is currently 36 years old. The patient has 3 brothers, 2 sisters. There is no history of breast, ovarian, or colon cancer in first-degree relatives. However, on the mother's side the patient has 3 relatives with breast cancer (a cousin diagnosed age 58, a second cousin diagnosed age 28, and a great aunt). There is also a history of colon cancer diagnosed age around age 21 in a great aunt and small intestinal cancer in an aunt diagnosed age 86. On the father's side there are 2 cousins with breast cancer diagnosed age 43 and 55, and a cousin with colon cancer diagnosed age 77  GYNECOLOGIC HISTORY:  No LMP recorded.  Patient has had an ablation. Menarche age 44, the patient carried 2 children to term, the first at age 59. After the first live birth she had a premature girl who died after one day (she had been a breech birth) and she also had 3 miscarriages. The patient underwent endometrial ablation in 1998 and has had no periods since that time.  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 Her husband Braulio Conte works for [employer] in the [place] and Manufacturing systems engineer. Son Millissa Deese lives in [place] and works in Education officer, community. He was an Gaffer) and son Anadalay Macdonell is 77 and works for [employer]. He also lives in [place]. The patient has one granddaughter. The patient is a Psychologist, forensic    ADVANCED DIRECTIVES: Not in place   HEALTH MAINTENANCE: Social History  Substance Use Topics  . Smoking status: Never Smoker  . Smokeless tobacco: Never Used  . Alcohol use No     Colonoscopy: June 2015/May and last repeat planned 10/03/2014  PAP: 2015  Bone density:  Lipid panel:  Allergies  Allergen Reactions  . Morphine And Related Nausea And Vomiting  . Sulfa Antibiotics Hives  . Meloxicam Rash    Current Outpatient Prescriptions  Medication Sig Dispense Refill  . anastrozole (ARIMIDEX) 1 MG tablet Take 1 tablet (1 mg total) by mouth daily. 90 tablet 4  . cholecalciferol (VITAMIN D) 400 units TABS tablet Take 400 Units by mouth.    . cloNIDine (CATAPRES - DOSED IN MG/24 HR) 0.1 mg/24hr patch Place 1 patch (0.1 mg total) onto the skin once a week. 4 patch 11  . eletriptan (RELPAX) 40 MG tablet Take 40 mg by mouth as needed. Reported on 10/01/2015    . omeprazole (PRILOSEC) 40 MG capsule Take 1 capsule (40 mg total) by mouth daily. 30 capsule 6  . rivaroxaban (XARELTO) 10 MG TABS tablet Take 10 mg by mouth daily.     No current facility-administered medications for this visit.     OBJECTIVE:  Middle-aged white womanWho appears stated age 34:   01/28/16 1519  BP: (!) 128/91  Pulse: 85  Resp: 18  Temp: 97.9 F (36.6 C)     Body mass index is 41.63 kg/m.  ECOG FS:1 - Symptomatic but completely ambulatory Filed Weights   01/28/16 1519  Weight: 273 lb 12.8 oz (124.2 kg)    Sclerae unicteric, pupils round and equal Oropharynx clear and moist-- no thrush or other lesions No cervical or supraclavicular adenopathy Lungs no rales or rhonchi Heart regular rate and rhythm Abd soft, obese, nontender, positive bowel sounds MSK no focal spinal tenderness, no upper extremity lymphedema Neuro: nonfocal, well oriented, appropriate affect Breasts: The right breast is status post lumpectomy followed by radiation, with no evidence of disease recurrence. The right axilla is benign. The left breast is unremarkable.   LAB RESULTS:  CMP     Component Value Date/Time   NA 142 01/28/2016 1446   K 4.1 01/28/2016 1446   CL 109 12/27/2014 1315   CO2 26 01/28/2016 1446   GLUCOSE 214 (H) 01/28/2016 1446   BUN 15.5 01/28/2016 1446   CREATININE 0.8 01/28/2016 1446   CALCIUM 9.4 01/28/2016 1446   PROT 6.8 01/28/2016 1446   ALBUMIN 3.5 01/28/2016 1446   AST 27 01/28/2016 1446   ALT 25 01/28/2016 1446   ALKPHOS 101 01/28/2016 1446   BILITOT 0.49 01/28/2016 1446   GFRNONAA >60 12/27/2014 1315   GFRAA >60 12/27/2014 1315    INo results found for: SPEP, UPEP  Lab Results  Component Value Date   WBC 5.4 01/28/2016   NEUTROABS 4.1 01/28/2016   HGB 14.0 01/28/2016   HCT 42.6 01/28/2016   MCV 92.6 01/28/2016   PLT 122 (L) 01/28/2016      Chemistry      Component Value Date/Time   NA 142 01/28/2016 1446   K 4.1 01/28/2016 1446   CL 109 12/27/2014 1315   CO2 26 01/28/2016 1446   BUN 15.5 01/28/2016 1446   CREATININE 0.8 01/28/2016 1446      Component Value Date/Time   CALCIUM 9.4 01/28/2016 1446   ALKPHOS 101 01/28/2016 1446   AST 27 01/28/2016 1446   ALT 25 01/28/2016  1446   BILITOT 0.49 01/28/2016 1446       No results found for: LABCA2  No components found for: LABCA125  No results for input(s): INR in the last 168 hours.  Urinalysis    Component Value Date/Time   COLORURINE AMBER (A) 02/21/2014 1540   APPEARANCEUR CLOUDY (A) 02/21/2014 1540   LABSPEC 1.029 02/21/2014 1540   PHURINE 5.0 02/21/2014 1540   GLUCOSEU NEGATIVE 02/21/2014 1540   HGBUR SMALL (A) 02/21/2014 1540   BILIRUBINUR SMALL (A) 02/21/2014 1540   KETONESUR 15 (A) 02/21/2014 1540   PROTEINUR NEGATIVE 02/21/2014 1540   UROBILINOGEN 1.0 02/21/2014 1540   NITRITE POSITIVE (A) 02/21/2014 1540   LEUKOCYTESUR TRACE (A) 02/21/2014 1540    STUDIES: CLINICAL DATA:  History of lumpectomy of the right breast in 2016. Follow-up evaluation.  EXAM: DIGITAL DIAGNOSTIC BILATERAL MAMMOGRAM WITH 3D TOMOSYNTHESIS AND CAD  COMPARISON:  Previous examinations the most recent which is dated 08/24/2014.  ACR Breast Density Category b: There are scattered areas of fibroglandular density.  FINDINGS: There are mild post lumpectomy scarring changes located within the lower inner quadrant of the right breast. There are mild post radiation changes within the right breast. The left breast stable. There are no findings worrisome recurrent tumor or developing malignancy within either breast.  Mammographic images were processed with CAD.  IMPRESSION: No findings worrisome for recurrent tumor or developing malignancy.  RECOMMENDATION: Bilateral diagnostic mammography in 1 year.  I have discussed the findings and recommendations with the  patient. Results were also provided in writing at the conclusion of the visit. If applicable, a reminder letter will be sent to the patient regarding the next appointment.  BI-RADS CATEGORY  1: Negative.   Electronically Signed   By: Altamese Cabal M.D.   On: 08/27/2015 11:01   ASSESSMENT: 55 y.o. [place] woman status post  right breast lower inner quadrant lumpectomy and sentinel lymph node sampling 08/27/2014 for a pT1c pN0, stage IA invasive ductal carcinoma, grade 3, estrogen and progesterone receptor positive, HER-2 negative, with an MIB-1 of 66%  (1) adjuvant chemotherapy with cyclophosphamide and docetaxel every 21 days 4, with onpro support, completed 11/26/2014  (2) adjuvant radiation 01/17/2015-03/06/2015: Right breast 50.4 gray in 28 fractions, lumpectomy cavity boost 12 gray in 6 fractions  (3) anastrozole: started 04/16/2015  (a) bone density scan 01/24/2015 shows osteopenia with a T score of -1.7.  (4) genetics panel negative for any mutations in the following genes: APC, ATM, AXIN2, BARD1, BMPR1A, BRCA1, BRCA2, BRIP1, CDH1, CDK4, CDKN2A, CHEK2, EPCAM, FANCC, MLH1, MSH2, MSH6, MUTYH, NBN, PALB2, PMS2, POLD1, POLE, PTEN, RAD51C, RAD51D, SCG5/GREM1, SMAD4, STK11, TP53, VHL, and XRCC2.   (5) R LE DVT documented 03/20/2014 following right sacroiliac joint effusion 02/22/2014  (a) hypercoagulable panel normal  (b) received Rivaroxaban for 6 months, completed April 2016   (c) right lower extremity venous Doppler 04/02/2015 was read as suspicious for a small nonocclusive thrombus in the right distal femoral vein. This is felt to be a very low thrombus pertinent with preserved phasic flow and augmentation.  (d) rivaroxaban resumed October 2016--to be continued indefinitely  (6) obesity:   PLAN: Teresa Aguilar is now a little over a year out from definitive surgery for her breast cancer with no evidence of disease. This is favorable.  She is tolerating anastrozole moderately well. Her hot flashes have declined in the joint problems she has are likely unrelated to the anastrozole and more likely related to her weight, which puts at a stress on her knees particularly. She also has significant degenerative disc disease.  She is profited in from physical therapy and other interventions through orthopedics.   The  plan from a cancer point of view is to continue the anastrozole for a total of 5 years. She will have her next mammogram March 2018 and see me the month after that for routine follow-up  She knows to call for any problems that may develop before that visit.  Chauncey Cruel, MD   02/02/2016 10:12 AM

## 2016-01-28 NOTE — Telephone Encounter (Signed)
appt made and avs printed °

## 2016-02-02 ENCOUNTER — Encounter: Payer: Self-pay | Admitting: Oncology

## 2016-05-07 IMAGING — CR DG CHEST 1V PORT
1 series · 1 of 1 positions shown · non-contrast
Comparison: 02/21/2014

CLINICAL DATA: Port-A-Cath placement

EXAM:
PORTABLE CHEST - 1 VIEW

[AP]
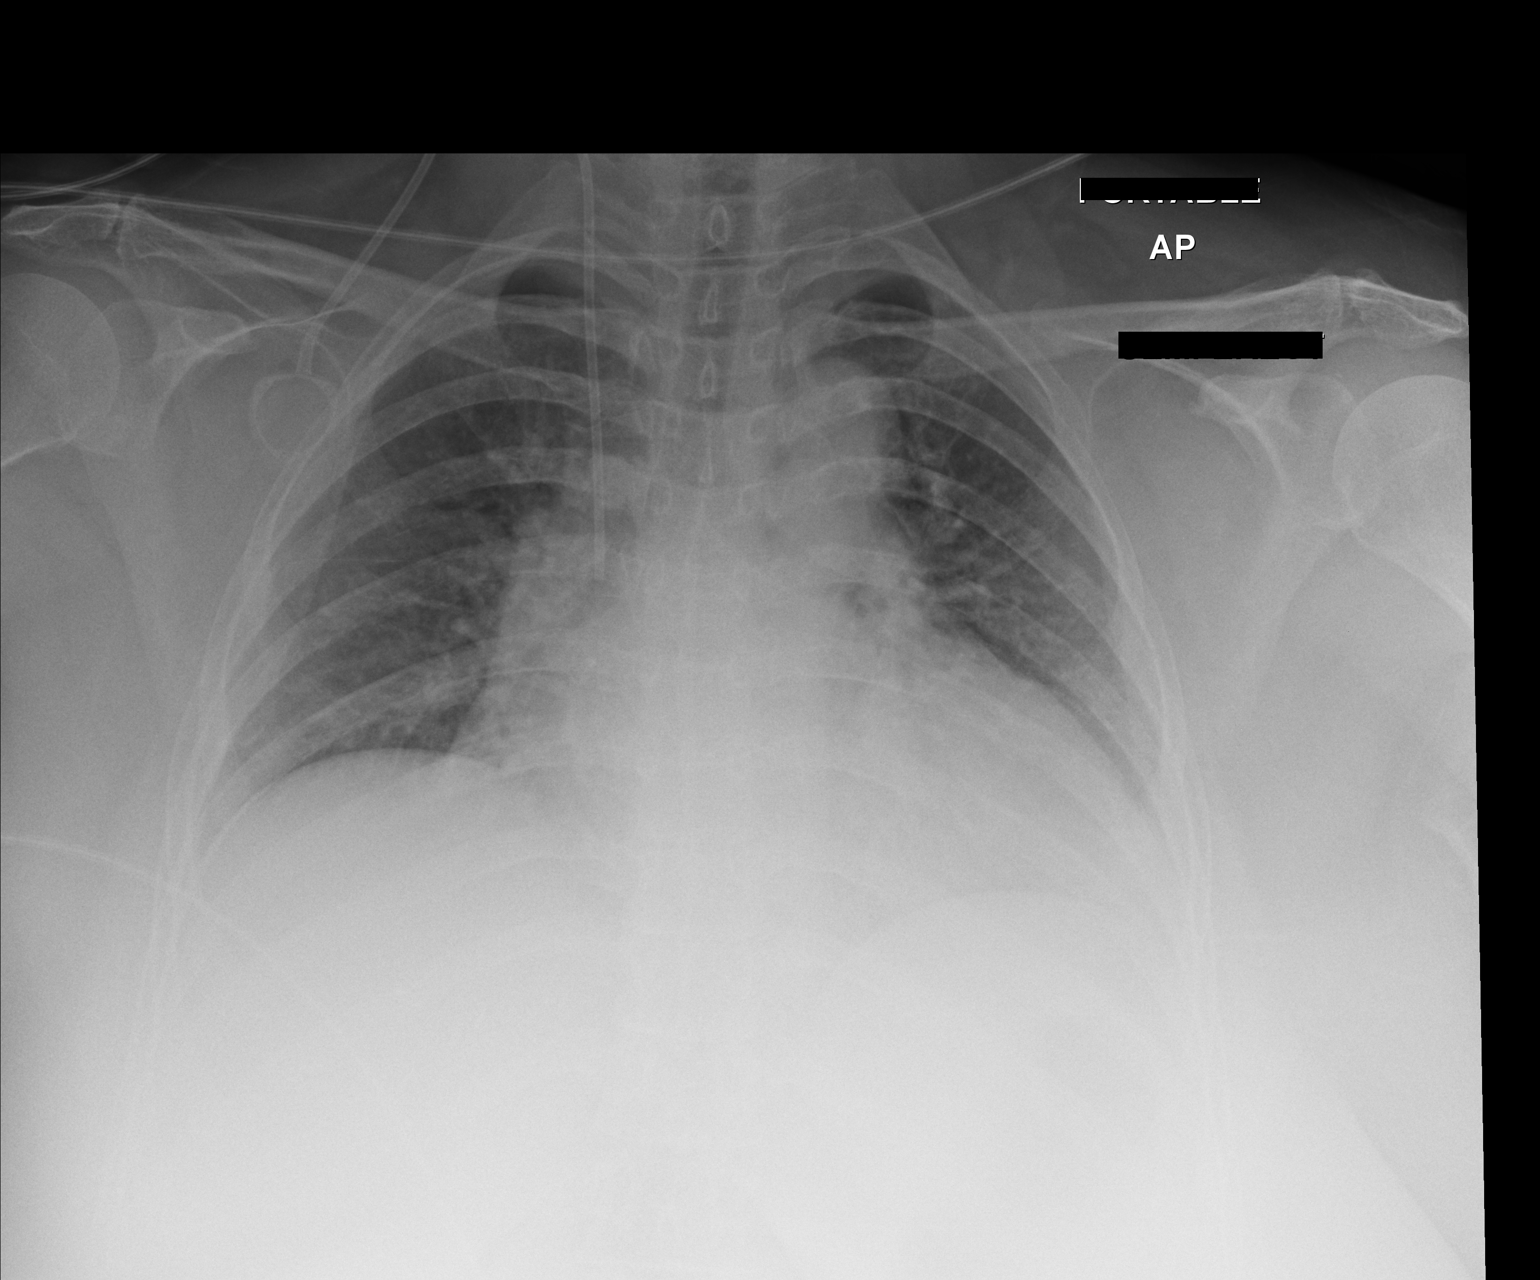

[1 of 1 positions shown; findings below may reference images not displayed]

FINDINGS: There is a right-sided Port-A-Cath with the tip projecting over the
SVC. There are low lung volumes with crowding of the interstitial
markings. There is no focal parenchymal opacity, pleural effusion,
or pneumothorax. The heart and mediastinal contours are
unremarkable.

The osseous structures are unremarkable.
IMPRESSION: Right-sided Port-A-Cath with the tip projecting over the SVC. No
pneumothorax.

## 2016-05-26 ENCOUNTER — Other Ambulatory Visit: Payer: Self-pay | Admitting: *Deleted

## 2016-05-26 DIAGNOSIS — Z86718 Personal history of other venous thrombosis and embolism: Secondary | ICD-10-CM

## 2016-06-22 ENCOUNTER — Other Ambulatory Visit: Payer: Self-pay | Admitting: *Deleted

## 2016-06-22 DIAGNOSIS — C50311 Malignant neoplasm of lower-inner quadrant of right female breast: Secondary | ICD-10-CM

## 2016-07-07 ENCOUNTER — Other Ambulatory Visit: Payer: Self-pay | Admitting: Obstetrics and Gynecology

## 2016-07-08 LAB — CYTOLOGY - PAP

## 2016-07-09 ENCOUNTER — Encounter: Payer: BC Managed Care – PPO | Admitting: Vascular Surgery

## 2016-07-09 ENCOUNTER — Ambulatory Visit (HOSPITAL_COMMUNITY): Payer: BC Managed Care – PPO

## 2016-09-01 ENCOUNTER — Other Ambulatory Visit: Payer: Self-pay | Admitting: Oncology

## 2016-09-01 ENCOUNTER — Ambulatory Visit
Admission: RE | Admit: 2016-09-01 | Discharge: 2016-09-01 | Disposition: A | Discharge status: Home / Self Care | Payer: BC Managed Care – PPO | Source: Ambulatory Visit | Attending: Oncology | Admitting: Oncology

## 2016-09-01 DIAGNOSIS — C50311 Malignant neoplasm of lower-inner quadrant of right female breast: Secondary | ICD-10-CM

## 2016-09-01 DIAGNOSIS — N6310 Unspecified lump in the right breast, unspecified quadrant: Secondary | ICD-10-CM

## 2016-09-01 DIAGNOSIS — R921 Mammographic calcification found on diagnostic imaging of breast: Secondary | ICD-10-CM

## 2016-09-01 HISTORY — DX: Personal history of irradiation: Z92.3

## 2016-09-04 ENCOUNTER — Other Ambulatory Visit: Payer: Self-pay | Admitting: Diagnostic Radiology

## 2016-09-04 ENCOUNTER — Ambulatory Visit
Admission: RE | Admit: 2016-09-04 | Discharge: 2016-09-04 | Disposition: A | Discharge status: Home / Self Care | Payer: BC Managed Care – PPO | Source: Ambulatory Visit | Attending: Oncology | Admitting: Oncology

## 2016-09-04 ENCOUNTER — Other Ambulatory Visit: Payer: Self-pay | Admitting: Oncology

## 2016-09-04 DIAGNOSIS — R921 Mammographic calcification found on diagnostic imaging of breast: Secondary | ICD-10-CM

## 2016-09-10 ENCOUNTER — Telehealth: Payer: Self-pay | Admitting: Oncology

## 2016-09-10 NOTE — Telephone Encounter (Signed)
Pt called to r/s 4/16 office visit to 4/17 at 430 pm due to going to court on 4/16 in [place].

## 2016-09-18 ENCOUNTER — Other Ambulatory Visit: Payer: Self-pay | Admitting: *Deleted

## 2016-09-18 DIAGNOSIS — C50311 Malignant neoplasm of lower-inner quadrant of right female breast: Secondary | ICD-10-CM

## 2016-09-21 ENCOUNTER — Other Ambulatory Visit (HOSPITAL_BASED_OUTPATIENT_CLINIC_OR_DEPARTMENT_OTHER): Payer: BC Managed Care – PPO

## 2016-09-21 DIAGNOSIS — C50311 Malignant neoplasm of lower-inner quadrant of right female breast: Secondary | ICD-10-CM | POA: Diagnosis not present

## 2016-09-21 LAB — COMPREHENSIVE METABOLIC PANEL
ALK PHOS: 91 U/L (ref 40–150)
ALT: 26 U/L (ref 0–55)
AST: 22 U/L (ref 5–34)
Albumin: 3.9 g/dL (ref 3.5–5.0)
Anion Gap: 12 mEq/L — ABNORMAL HIGH (ref 3–11)
BUN: 15.9 mg/dL (ref 7.0–26.0)
CALCIUM: 9.4 mg/dL (ref 8.4–10.4)
CHLORIDE: 106 meq/L (ref 98–109)
CO2: 26 mEq/L (ref 22–29)
Creatinine: 0.9 mg/dL (ref 0.6–1.1)
EGFR: 70 mL/min/{1.73_m2} — AB (ref >90)
Glucose: 189 mg/dl — ABNORMAL HIGH (ref 70–140)
POTASSIUM: 4 meq/L (ref 3.5–5.1)
SODIUM: 143 meq/L (ref 136–145)
Total Bilirubin: 0.55 mg/dL (ref 0.20–1.20)
Total Protein: 6.9 g/dL (ref 6.4–8.3)

## 2016-09-21 LAB — CBC WITH DIFFERENTIAL/PLATELET
BASO%: 0.2 % (ref 0.0–2.0)
BASOS ABS: 0 10*3/uL (ref 0.0–0.1)
EOS ABS: 0.1 10*3/uL (ref 0.0–0.5)
EOS%: 1.2 % (ref 0.0–7.0)
HCT: 40.4 % (ref 34.8–46.6)
HGB: 13.5 g/dL (ref 11.6–15.9)
LYMPH%: 24.5 % (ref 14.0–49.7)
MCH: 31 pg (ref 25.1–34.0)
MCHC: 33.4 g/dL (ref 31.5–36.0)
MCV: 92.9 fL (ref 79.5–101.0)
MONO#: 0.4 10*3/uL (ref 0.1–0.9)
MONO%: 9.5 % (ref 0.0–14.0)
NEUT#: 2.8 10*3/uL (ref 1.5–6.5)
NEUT%: 64.6 % (ref 38.4–76.8)
Platelets: 124 10*3/uL — ABNORMAL LOW (ref 145–400)
RBC: 4.35 10*6/uL (ref 3.70–5.45)
RDW: 14.2 % (ref 11.2–14.5)
WBC: 4.3 10*3/uL (ref 3.9–10.3)
lymph#: 1.1 10*3/uL (ref 0.9–3.3)

## 2016-09-28 ENCOUNTER — Ambulatory Visit: Payer: BC Managed Care – PPO | Admitting: Oncology

## 2016-09-29 ENCOUNTER — Ambulatory Visit (HOSPITAL_BASED_OUTPATIENT_CLINIC_OR_DEPARTMENT_OTHER): Payer: BC Managed Care – PPO | Admitting: Oncology

## 2016-09-29 VITALS — BP 160/72 | HR 89 | Temp 97.5°F | Resp 18 | Ht 68.0 in | Wt 271.5 lb

## 2016-09-29 DIAGNOSIS — C50311 Malignant neoplasm of lower-inner quadrant of right female breast: Secondary | ICD-10-CM | POA: Diagnosis not present

## 2016-09-29 DIAGNOSIS — Z79811 Long term (current) use of aromatase inhibitors: Secondary | ICD-10-CM

## 2016-09-29 DIAGNOSIS — Z170 Estrogen receptor positive status [ER+]: Secondary | ICD-10-CM

## 2016-09-29 DIAGNOSIS — D696 Thrombocytopenia, unspecified: Secondary | ICD-10-CM

## 2016-09-29 DIAGNOSIS — M8580 Other specified disorders of bone density and structure, unspecified site: Secondary | ICD-10-CM

## 2016-09-29 NOTE — Progress Notes (Signed)
[facility]  [phone]) [phone] Fax:(336) [phone]     ID: Teresa Aguilar DOB: August 01, 1960  MR#: [id number]  MHD#:[id number]  Patient Care Team: Stephens Shire, MD as PCP - General (Family Medicine) [name], MD as Consulting Physician (General Surgery) Chauncey Cruel, MD as Consulting Physician (Oncology) [name], MD as Consulting Physician (Radiation Oncology) [name], NP as Nurse Practitioner (Hematology and Oncology) PCP: Stephens Shire, MD GYN: [name] MD SU: [name] MD OTHER MD: [name] MD, Phylliss Bob MD, Jamie Kato MD  CHIEF COMPLAINT: Estrogen receptor positive breast cancer  CURRENT TREATMENT: anastrozole, rivaroxaban  BREAST CANCER HISTORY:   from the original intake note:  "Teresa Aguilar" had routine screening mammography January 2016 in Dr. Tonette Bihari office showing a possible change in the right breast. On 07/24/2014 at the breast Center she underwent right digital mammography and ultrasonography. The breast density was category A. In the lower inner quadrant of the right breast there was a 2 cm mass which was palpable by exam. Ultrasound confirmed a 1.5 cm irregular hypoechoic mass at the 3:30 o'clock position 12 cm from the nipple. There was no right axillary adenopathy.  Biopsy of the mass in question 07/24/2014 showed ([id number]) invasive ductal carcinoma, grade 2 or 3, estrogen receptor 98% positive, progesterone receptor 76% positive, both with strong staining intensity, with an MIB-1 of 66% and no HER-2 amplification by FISH.  The patient's case was discussed at the multidisciplinary breast cancer conference 08/08/2014 and it was felt the patient would benefit from breast conserving surgery and likely would need an Oncotype to decide on optimal systemic therapy. She would need radiation and hormones. The question of genetics counseling was also raised.  On 08/27/2014 the patient underwent  right lumpectomy and sentinel lymph node sampling. The final pathology ([id number]) confirmed invasive ductal carcinoma, grade 3, measuring 1.9 cm. There was evidence of lymphovascular and perineural invasion. Margins were negative but close, the closest margin for the in situ component being 1 mm. The single sentinel lymph node was negative. Repeat HER-2 was again negative, with a signals ratio of 0.97 and a number per cell of 1.60.  The patient's subsequent history is as detailed below  INTERVAL HISTORY: Teresa Aguilar returns today for follow-up of her estrogen receptor positive breast cancer. She continues on anastrozole, generally with good tolerance. She does have hot flashes several times most days. They're less intense at night. Vaginal dryness is not an issue. She never developed significant arthralgias or myalgias. She obtains a drug at less than $10 per month.  She is also on rivaroxaban. She is having no bleeding complications from that at this point though she did develop a hematoma at the time of seed placement for her recent breast biopsy (which showed only necrosis).  REVIEW OF SYSTEMS: Teresa Aguilar feels tired all the time. She describes this is mild. She has discomfort in the surgical breast. This is very intermittent. She thinks her vision may be getting worse and she may need glasses. She has diarrhea off and on. She has significant back pain which is not a new problem. She recently went to the pain clinic and has been prescribed Cymbalta. She feels forgetful, but not depressed. Her diabetes is not under good control she thinks. A detailed review of systems today was otherwise stable.   PAST MEDICAL HISTORY: Past Medical History:  Diagnosis Date  . Anxiety    relative for to pain & frustration of prev. hospitalization   .  Arthritis    degenerative lumbar spine    . Breast cancer ([place]) 2016  . Breast cancer of lower-inner quadrant of right female breast ([place])   . Chronic back pain   .  Colon cancer ([place])   . DVT (deep venous thrombosis) ([place]) 03/2014   R leg, post op- on Xarelto   . Family history of adverse reaction to anesthesia    PONV  . Family history of breast cancer   . Family history of colon cancer   . GERD (gastroesophageal reflux disease)    used during chemo, has not taken recently  . Gestational diabetes 1986; 1996  . H/O cardiovascular stress test 2012    test done for stress from her Father dying, had chest pain ,pt. had a stress/echo  test, told it was wnl  . Hypertension    pt. reports that she has been higher in the past & again now but doesn't take any med.,never has for ^BP, pt. stating that BP is high now b/c of her pain in her back.    . Hypoxia    after surgery  . Migraines    "at least q other week" (08/27/2014)  . Personal history of radiation therapy   . PONV (postoperative nausea and vomiting)    low O2 sats, < 50% post op  . Radiation 01/17/15-03/06/15   right breast 50.4 gray, lumpectomy cavity boost to 12 gray    PAST SURGICAL HISTORY: Past Surgical History:  Procedure Laterality Date  . AXILLARY SENTINEL NODE BIOPSY Right 08/27/2014   Archie Endo 08/27/2014  . BACK SURGERY    . BREAST BIOPSY Right 07/24/2014   core biopsy  . BREAST LUMPECTOMY Right 08/27/2014   Archie Endo 08/27/2014  . COLONOSCOPY W/ POLYPECTOMY  2015   found tubulovillous adenoma with focal high grade displasia  . DENTAL SURGERY  ~ 2011   "replaced bone graft upper jaw; put 2 implants in"  . ENDOMETRIAL ABLATION  ~ 1998  . EXCISIONAL HEMORRHOIDECTOMY  2015  . EYE MUSCLE SURGERY Bilateral ~ 1965   to fix cross-eye as child  . PLANTAR FASCIA RELEASE Right ~ 2000  . PORT-A-CATH REMOVAL Right 12/31/2014   Procedure: REMOVAL PORT-A-CATH;  Surgeon: [name], MD;  Location: [facility];  Service: General;  Laterality: Right;  . PORTACATH PLACEMENT N/A 09/19/2014   Procedure: INSERTION PORT-A-CATH;  Surgeon: [name], MD;  Location: [facility];  Service: General;  Laterality:  N/A;  . RADIOACTIVE SEED GUIDED MASTECTOMY WITH AXILLARY SENTINEL LYMPH NODE BIOPSY Right 08/27/2014   Procedure: RADIOACTIVE SEED GUIDED RIGHT BREAST LUMPECTOMY WITH RIGHT AXILLARY SENTINEL LYMPH NODE BIOPSY;  Surgeon: [name], MD;  Location: [facility];  Service: General;  Laterality: Right;  . RADIOFREQUENCY ABLATION NERVES     x 2 to nerves in her lower back  . SACROILIAC JOINT FUSION Right 02/22/2014   Procedure: SACROILIAC JOINT FUSION;  Surgeon: Sinclair Ship, MD;  Location: [facility];  Service: Orthopedics;  Laterality: Right;  Right sided sacroiliac joint fusion  . TONSILLECTOMY  1988  . TUBAL LIGATION  1996    FAMILY HISTORY Family History  Problem Relation Age of Onset  . Breast cancer Cousin 46    maternal first cousin  . Colon cancer Maternal Aunt 75  . Throat cancer Maternal Aunt 75    smoker  . Breast cancer Paternal Aunt 21  . Heart disease Mother   . Heart disease Father   . Diabetes type II Father   . Stomach cancer  Maternal Uncle     dx in his 34s  . Colon cancer Cousin 57     maternal first cousin  . Prostate cancer Cousin 26    paternal first cousin  . Breast cancer Paternal Aunt     dx in her 7s  . Breast cancer Other     mother's paternal first cousin  . Colon cancer Other 12    MGF's sister  . Brain cancer Other 38    MGFs sister  . Breast cancer Other     MGF's paternal aunt  . Prostate cancer Other     MGF's paternal first cousin  . Breast cancer Paternal Aunt    the patient's father died at the age of 33 from heart failure in the setting of diabetes. The patient's mother is currently 79 years old. The patient has 3 brothers, 2 sisters. There is no history of breast, ovarian, or colon cancer in first-degree relatives. However, on the mother's side the patient has 3 relatives with breast cancer (a cousin diagnosed age 43, a second cousin diagnosed age 45, and a great aunt). There is also a history of colon cancer diagnosed age around age 67  in a great aunt and small intestinal cancer in an aunt diagnosed age 53. On the father's side there are 2 cousins with breast cancer diagnosed age 99 and 93, and a cousin with colon cancer diagnosed age 7  GYNECOLOGIC HISTORY:  No LMP recorded. Patient has had an ablation. Menarche age 68, the patient carried 2 children to term, the first at age 10. After the first live birth she had a premature girl who died after one day (she had been a breech birth) and she also had 3 miscarriages. The patient underwent endometrial ablation in 1998 and has had no periods since that time.  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 Her husband Braulio Conte works for [employer] in the [place] and Manufacturing systems engineer. Son Neilah Fulwider lives in [place] and works in Education officer, community. He was an Gaffer) and son Vail Basista is 58 and works for [employer]. He also lives in [place]. The patient has one granddaughter. The patient is a Psychologist, forensic    ADVANCED DIRECTIVES: Not in place   HEALTH MAINTENANCE: Social History  Substance Use Topics  . Smoking status: Never Smoker  . Smokeless tobacco: Never Used  . Alcohol use No     Colonoscopy: June 2015/May and last repeat planned 10/03/2014  PAP: 2015  Bone density:  Lipid panel:  Allergies  Allergen Reactions  . Morphine And Related Nausea And Vomiting  . Sulfa Antibiotics Hives  . Meloxicam Rash    Current Outpatient Prescriptions  Medication Sig Dispense Refill  . anastrozole (ARIMIDEX) 1 MG tablet Take 1 tablet (1 mg total) by mouth daily. 90 tablet 4  . cholecalciferol (VITAMIN D) 400 units TABS tablet Take 400 Units by mouth.    . cloNIDine (CATAPRES - DOSED IN MG/24 HR) 0.1 mg/24hr patch Place 1 patch (0.1 mg total) onto the skin once a week. 4 patch 11  . eletriptan (RELPAX) 40 MG tablet Take 40 mg by mouth as needed. Reported on 10/01/2015    .  omeprazole (PRILOSEC) 40 MG capsule Take 1 capsule (40 mg total) by mouth daily. 30 capsule 6  . rivaroxaban (XARELTO) 10 MG TABS tablet Take 10 mg by mouth daily.     No current facility-administered medications  for this visit.     OBJECTIVE: Middle-aged white woman in no acute distress   Vitals:   09/29/16 1612  BP: (!) 160/72  Pulse: 89  Resp: 18  Temp: 97.5 F (36.4 C)     Body mass index is 41.28 kg/m.    ECOG FS:1 - Symptomatic but completely ambulatory Filed Weights   09/29/16 1612  Weight: 271 lb 8 oz (123.2 kg)    Sclerae unicteric, EOMs intact Oropharynx clear and moist No cervical or supraclavicular adenopathy Lungs no rales or rhonchi Heart regular rate and rhythm Abd soft, obese, nontender, positive bowel sounds MSK no focal spinal tenderness, no upper extremity lymphedema Neuro: nonfocal, well oriented, appropriate affect Breasts: The right breast as undergone lumpectomy followed by radiation. There is no evidence of local recurrence. The left breast is benign. Both axillae are benign.   LAB RESULTS:  CMP     Component Value Date/Time   NA 143 09/21/2016 1131   K 4.0 09/21/2016 1131   CL 109 12/27/2014 1315   CO2 26 09/21/2016 1131   GLUCOSE 189 (H) 09/21/2016 1131   BUN 15.9 09/21/2016 1131   CREATININE 0.9 09/21/2016 1131   CALCIUM 9.4 09/21/2016 1131   PROT 6.9 09/21/2016 1131   ALBUMIN 3.9 09/21/2016 1131   AST 22 09/21/2016 1131   ALT 26 09/21/2016 1131   ALKPHOS 91 09/21/2016 1131   BILITOT 0.55 09/21/2016 1131   GFRNONAA >60 12/27/2014 1315   GFRAA >60 12/27/2014 1315    INo results found for: SPEP, UPEP  Lab Results  Component Value Date   WBC 4.3 09/21/2016   NEUTROABS 2.8 09/21/2016   HGB 13.5 09/21/2016   HCT 40.4 09/21/2016   MCV 92.9 09/21/2016   PLT 124 (L) 09/21/2016      Chemistry      Component Value Date/Time   NA 143 09/21/2016 1131   K 4.0 09/21/2016 1131   CL 109 12/27/2014 1315   CO2 26 09/21/2016 1131    BUN 15.9 09/21/2016 1131   CREATININE 0.9 09/21/2016 1131      Component Value Date/Time   CALCIUM 9.4 09/21/2016 1131   ALKPHOS 91 09/21/2016 1131   AST 22 09/21/2016 1131   ALT 26 09/21/2016 1131   BILITOT 0.55 09/21/2016 1131       No results found for: LABCA2  No components found for: LABCA125  No results for input(s): INR in the last 168 hours.  Urinalysis    Component Value Date/Time   COLORURINE AMBER (A) 02/21/2014 1540   APPEARANCEUR CLOUDY (A) 02/21/2014 1540   LABSPEC 1.029 02/21/2014 1540   PHURINE 5.0 02/21/2014 1540   GLUCOSEU NEGATIVE 02/21/2014 1540   HGBUR SMALL (A) 02/21/2014 1540   BILIRUBINUR SMALL (A) 02/21/2014 1540   KETONESUR 15 (A) 02/21/2014 1540   PROTEINUR NEGATIVE 02/21/2014 1540   UROBILINOGEN 1.0 02/21/2014 1540   NITRITE POSITIVE (A) 02/21/2014 1540   LEUKOCYTESUR TRACE (A) 02/21/2014 1540    STUDIES: US Breast Ltd Uni Right Inc Axilla  Result Date: 09/01/2016 CLINICAL DATA:  History of right breast cancer in 2016 status post lumpectomy and radiation therapy.Patient also describes a new palpable lump within the upper right breast, 12 o'clock axis region. EXAM: 2D DIGITAL DIAGNOSTIC BILATERAL MAMMOGRAM WITH CAD AND ADJUNCT TOMO ULTRASOUND RIGHT BREAST COMPARISON:  Previous exam(s). ACR Breast Density Category b: There are scattered areas of fibroglandular density. FINDINGS: There are new grouped coarse heterogeneous calcifications within the upper-outer quadrant of the right breast, at  posterior depth, spanning 5 mm, corresponding as an incidental finding. There are stable postsurgical changes within the inner right breast. There is no mammographic abnormality within the upper right breast corresponding to the area of patient's palpable lump, with overlying skin marker in place. There are no new dominant masses, suspicious calcifications or secondary signs of malignancy within the left breast. Mammographic images were processed with CAD. Targeted  ultrasound is performed, evaluating the upper right breast corresponding to the area of clinical concern, 12-1 o'clock axis, as directed by the patient, showing only normal fibroglandular tissues and fat lobules throughout. No suspicious solid or cystic masses are identified. Right axillary ultrasound shows no enlarged or morphologically abnormal lymph nodes. IMPRESSION: 1. New grouped coarse heterogeneous calcifications within the upper-outer quadrant of the RIGHT breast, at posterior depth, spanning 5 mm. This is a suspicious finding for which stereotactic biopsy is recommended. 2. No evidence of malignancy within the right breast at the site of patient's palpable lump. 3. No evidence of malignancy within the left breast. RECOMMENDATION: Stereotactic biopsy for the new calcifications within the upper-outer quadrant of the right breast. Stereotactic biopsy is scheduled for 09/04/2016. I have discussed the findings and recommendations with the patient. Results were also provided in writing at the conclusion of the visit. If applicable, a reminder letter will be sent to the patient regarding the next appointment. BI-RADS CATEGORY  4: Suspicious. Electronically Signed   By: Franki Cabot M.D.   On: 09/01/2016 12:58   Mm Diag Breast Tomo Bilateral  Result Date: 09/01/2016 CLINICAL DATA:  History of right breast cancer in 2016 status post lumpectomy and radiation therapy.Patient also describes a new palpable lump within the upper right breast, 12 o'clock axis region. EXAM: 2D DIGITAL DIAGNOSTIC BILATERAL MAMMOGRAM WITH CAD AND ADJUNCT TOMO ULTRASOUND RIGHT BREAST COMPARISON:  Previous exam(s). ACR Breast Density Category b: There are scattered areas of fibroglandular density. FINDINGS: There are new grouped coarse heterogeneous calcifications within the upper-outer quadrant of the right breast, at posterior depth, spanning 5 mm, corresponding as an incidental finding. There are stable postsurgical changes within the  inner right breast. There is no mammographic abnormality within the upper right breast corresponding to the area of patient's palpable lump, with overlying skin marker in place. There are no new dominant masses, suspicious calcifications or secondary signs of malignancy within the left breast. Mammographic images were processed with CAD. Targeted ultrasound is performed, evaluating the upper right breast corresponding to the area of clinical concern, 12-1 o'clock axis, as directed by the patient, showing only normal fibroglandular tissues and fat lobules throughout. No suspicious solid or cystic masses are identified. Right axillary ultrasound shows no enlarged or morphologically abnormal lymph nodes. IMPRESSION: 1. New grouped coarse heterogeneous calcifications within the upper-outer quadrant of the RIGHT breast, at posterior depth, spanning 5 mm. This is a suspicious finding for which stereotactic biopsy is recommended. 2. No evidence of malignancy within the right breast at the site of patient's palpable lump. 3. No evidence of malignancy within the left breast. RECOMMENDATION: Stereotactic biopsy for the new calcifications within the upper-outer quadrant of the right breast. Stereotactic biopsy is scheduled for 09/04/2016. I have discussed the findings and recommendations with the patient. Results were also provided in writing at the conclusion of the visit. If applicable, a reminder letter will be sent to the patient regarding the next appointment. BI-RADS CATEGORY  4: Suspicious. Electronically Signed   By: Franki Cabot M.D.   On: 09/01/2016 12:58   Mm  Clip Placement Right  Result Date: 09/04/2016 CLINICAL DATA:  Right breast calcifications. EXAM: DIAGNOSTIC RIGHT MAMMOGRAM POST STEREOTACTIC BIOPSY COMPARISON:  Previous exam(s). FINDINGS: Mammographic images were obtained following stereotactic guided biopsy of calcifications in the upper-outer quadrant of the right breast. Mammographic images show there  is a coil shaped clip in the upper-outer quadrant of the right breast in appropriate position. IMPRESSION: Status post stereotactic biopsy of the right breast with pathology pending. Final Assessment: Post Procedure Mammograms for Marker Placement Electronically Signed   By: Lillia Mountain M.D.   On: 09/04/2016 10:48   Mm Rt Breast Bx W Loc Dev 1st Lesion Image Bx Spec Stereo Guide  Addendum Date: 09/10/2016   ADDENDUM REPORT: 09/07/2016 11:51 ADDENDUM: Pathology revealed fat necrosis with calcifications in the right breast . This was found to be concordant by Dr. Lillia Mountain. Pathology results were discussed with the patient by telephone. The patient reported doing well after the biopsy with bruising at the site. Post biopsy instructions and care were reviewed and questions were answered. The patient was encouraged to call [facility] for any additional concerns. The patient was instructed to return for annual diagnostic mammography and informed that a reminder letter would be sent regarding this appointment. Pathology results reported by Susa Raring RN, BSN on 09/07/2016. Electronically Signed   By: Lillia Mountain M.D.   On: 09/07/2016 11:51   Result Date: 09/10/2016 CLINICAL DATA:  Right breast calcifications. EXAM: Choose 3 BREAST STEREOTACTIC CORE NEEDLE BIOPSY COMPARISON:  Previous exams. FINDINGS: The patient and I discussed the procedure of stereotactic-guided biopsy including benefits and alternatives. We discussed the high likelihood of a successful procedure. We discussed the risks of the procedure including infection, bleeding, tissue injury, clip migration, and inadequate sampling. Informed written consent was given. The usual time out protocol was performed immediately prior to the procedure. Using sterile technique and 1% lidocaine 1% lidocaine with epinephrine as local anesthetic, under stereotactic guidance, a 9 gauge vacuum assisted device was used to perform core needle  biopsy of calcifications in the upper-outer quadrant of the right breast using a lateral to medial approach. Specimen radiograph was performed showing calcifications are present in the tissue samples. Specimens with calcifications are identified for pathology. At the conclusion of the procedure, a coil shaped tissue marker clip was deployed into the biopsy cavity. Follow-up 2-view mammogram was performed and dictated separately. IMPRESSION: Stereotactic-guided biopsy of the right per. No apparent complications. Electronically Signed: By: Lillia Mountain M.D. On: 09/04/2016 10:39     ASSESSMENT: 56 y.o. [place] woman status post right breast lower inner quadrant lumpectomy and sentinel lymph node sampling 08/27/2014 for a pT1c pN0, stage IA invasive ductal carcinoma, grade 3, estrogen and progesterone receptor positive, HER-2 negative, with an MIB-1 of 66%  (1) adjuvant chemotherapy with cyclophosphamide and docetaxel every 21 days 4, with onpro support, completed 11/26/2014  (2) adjuvant radiation 01/17/2015-03/06/2015: Right breast 50.4 gray in 28 fractions, lumpectomy cavity boost 12 gray in 6 fractions  (3) anastrozole: started 04/16/2015  (a) bone density scan 01/24/2015 shows osteopenia with a T score of -1.7.  (4) genetics panel negative for any mutations in the following genes: APC, ATM, AXIN2, BARD1, BMPR1A, BRCA1, BRCA2, BRIP1, CDH1, CDK4, CDKN2A, CHEK2, EPCAM, FANCC, MLH1, MSH2, MSH6, MUTYH, NBN, PALB2, PMS2, POLD1, POLE, PTEN, RAD51C, RAD51D, SCG5/GREM1, SMAD4, STK11, TP53, VHL, and XRCC2.   (5) R LE DVT documented 03/20/2014 following right sacroiliac joint effusion 02/22/2014  (a) hypercoagulable panel normal  (  b) received Rivaroxaban for 6 months, completed April 2016   (c) right lower extremity venous Doppler 04/02/2015 was read as suspicious for a small nonocclusive thrombus in the right distal femoral vein. This is felt to be a very low thrombus pertinent with preserved phasic  flow and augmentation.  (d) rivaroxaban resumed October 2016--to be continued indefinitely  (6) obesity: Contemplating bariatric surgery  (7) thrombocytopenia, stable  PLAN: Teresa Aguilar is now 2 years out from definitive surgery for her breast cancer with no evidence of disease recurrence. This is very favorable.  She is tolerating the anastrozole well and the plan will be to continue this for a total of 5 years.  She will be due for repeat bone density before her next visit here.  She is contemplating bariatric surgery. I would support that decision.  We discussed various ways of treating her hot flashes. Because she has just been prescribed Cymbalta would wait and see if that helps her in that regard as well as as far as the pain is concerned. Otherwise she will return to see me in one year.   She knows to call for any problems that may develop before the next visit.  Chauncey Cruel, MD   09/29/2016 4:25 PM

## 2016-11-20 ENCOUNTER — Ambulatory Visit: Payer: BC Managed Care – PPO | Admitting: Registered"

## 2016-12-11 ENCOUNTER — Other Ambulatory Visit (HOSPITAL_COMMUNITY): Payer: Self-pay | Admitting: Surgery

## 2016-12-15 ENCOUNTER — Encounter: Payer: Self-pay | Admitting: Registered"

## 2016-12-15 ENCOUNTER — Encounter: Payer: BC Managed Care – PPO | Attending: Surgery | Admitting: Registered"

## 2016-12-15 DIAGNOSIS — Z713 Dietary counseling and surveillance: Secondary | ICD-10-CM | POA: Diagnosis not present

## 2016-12-15 DIAGNOSIS — E669 Obesity, unspecified: Secondary | ICD-10-CM

## 2016-12-15 NOTE — Progress Notes (Signed)
Pre-Op Assessment Visit:  Pre-Operative Sleeve Gastrectomy Surgery  Medical Nutrition Therapy:  Appt start time: 10:45 End time:  11:50  Patient was seen on 12/15/2016 for Pre-Operative Nutrition Assessment. Assessment and letter of approval faxed to [facility] Surgery Bariatric Surgery Program coordinator on 12/15/2016.   Pt expectation of surgery: help with diabetes, hypertension, cancer; increase self-esteem  Pt expectation of Dietitian: help with foods; what to eat/cannot eat  Start weight at [facility]: 266.2 BMI: 44.64   Pt is talkative. Pt states she has been taking metformin for about 6 months. Pt reports history of gestational diabetes and states "blood sugars typically increase when sick". Pt states she was prescribed metformin since breast cancer diagnosis and blood sugars had increased. Pt reports being a picky eater; doesn't eat vegetables (only corn and potatoes); doesn't like seafood. Pt reports bread as trigger food. Pt states she loves hot dogs. Pt states she is getting ready to start gym, water aerobics as physical therapy for back. Pt states lately she has had low energy. Pt states she used to drink 4 pepsi's a day and eliminated them from diet 3 weeks ago. Pt reports her sister had RYGB with our program about a year ago. Pt reports having early stages of osteoporosis due to cancer; takes Vitamin D supplement.   Per insurance, pt needs 0 SWL visits prior to surgery.    24 hr Dietary Recall: First Meal: egg, toast Snack: sometimes mini candy bar Second Meal: fast food or soup and sandwich Snack: sometimes fat-free fudge bars Third Meal: roast or chicken breast (crock pot) or burger/hot dogs, potatoes, corn or noodles, baked beans Snack: none Beverages: water, caffeine-free sweet tea, 1% milk  Encouraged to engage in 150 minutes of moderate physical activity including cardiovascular and weight baring weekly  Handouts given during visit include:  . Pre-Op  Goals . Bariatric Surgery Protein Shakes . Vitamin and Mineral Recommendations  During the appointment today the following Pre-Op Goals were reviewed with the patient: . Maintain or lose weight as instructed by your surgeon . Make healthy food choices . Begin to limit portion sizes . Limited concentrated sugars and fried foods . Keep fat/sugar in the single digits per serving on          food labels . Practice CHEWING your food  (aim for 30 chews per bite or until applesauce consistency) . Practice not drinking 15 minutes before, during, and 30 minutes after each meal/snack . Avoid all carbonated beverages  . Avoid/limit caffeinated beverages  . Avoid all sugar-sweetened beverages . Consume 3 meals per day; eat every 3-5 hours . Make a list of non-food related activities . Aim for 64-100 ounces of FLUID daily  . Aim for at least 60-80 grams of PROTEIN daily . Look for a liquid protein source that contain ?15 g protein and ?5 g carbohydrate  (ex: shakes, drinks, shots) . Physical activity is an important part of a healthy lifestyle so keep it moving!  Follow diet recommendations listed below Energy and Macronutrient Recommendations: Calories: 2000 Carbohydrate: 225 Protein: 150 Fat: 56  Demonstrated degree of understanding via:  Teach Back   Teaching Method Utilized:  Visual Auditory Hands on  Barriers to learning/adherence to lifestyle change: picky eater  Patient to call the [facility] to enroll in Pre-Op and Post-Op Nutrition Education when surgery date is scheduled.

## 2017-01-06 ENCOUNTER — Other Ambulatory Visit: Payer: Self-pay

## 2017-01-06 ENCOUNTER — Ambulatory Visit (HOSPITAL_COMMUNITY)
Admission: RE | Admit: 2017-01-06 | Discharge: 2017-01-06 | Disposition: A | Discharge status: Home / Self Care | Payer: BC Managed Care – PPO | Source: Ambulatory Visit | Attending: Surgery | Admitting: Surgery

## 2017-01-06 DIAGNOSIS — Z6841 Body Mass Index (BMI) 40.0 and over, adult: Secondary | ICD-10-CM | POA: Insufficient documentation

## 2017-01-25 ENCOUNTER — Encounter: Payer: BC Managed Care – PPO | Attending: Surgery | Admitting: Skilled Nursing Facility1

## 2017-01-25 DIAGNOSIS — Z713 Dietary counseling and surveillance: Secondary | ICD-10-CM | POA: Insufficient documentation

## 2017-01-25 DIAGNOSIS — E119 Type 2 diabetes mellitus without complications: Secondary | ICD-10-CM

## 2017-01-26 ENCOUNTER — Encounter: Payer: Self-pay | Admitting: Skilled Nursing Facility1

## 2017-01-26 NOTE — Progress Notes (Signed)
Pre-Operative Nutrition Class:  Appt start time: 8483   End time:  1830.  Patient was seen on 01/26/2017 for Pre-Operative Bariatric Surgery Education at [facility].   Surgery date:  Surgery type: Sleeve Gastrectomy  Start weight at [facility]: 266.2 Weight today: 257.1  Samples given per MNT protocol. Patient educated on appropriate usage: Bariatric Advantage Calcium  Lot # [id number] Exp: sep-26-2018  Celebrate Vitamins Calcium  Lot # [id number] Exp: 05/2018  Unjury powder Lot # [id number] Exp: 08/2017  The following the learning objectives were met by the patient during this course:  Identify Pre-Op Dietary Goals and will begin 2 weeks pre-operatively  Identify appropriate sources of fluids and proteins   State protein recommendations and appropriate sources pre and post-operatively  Identify Post-Operative Dietary Goals and will follow for 2 weeks post-operatively  Identify appropriate multivitamin and calcium sources  Describe the need for physical activity post-operatively and will follow MD recommendations  State when to call healthcare provider regarding medication questions or post-operative complications  Handouts given during class include:  Pre-Op Bariatric Surgery Diet Handout  Protein Shake Handout  Post-Op Bariatric Surgery Nutrition Handout  [facility] Program Information Flyer  Support Group Information Flyer  [facility] Bariatric Supplements Price List  Follow-Up Plan: Patient will follow-up at [facility] 2 weeks post operatively for diet advancement per MD.

## 2017-02-16 NOTE — Progress Notes (Signed)
Please place orders in EPIC as patient has a pre-op appointment on 02/19/2017! Thank you!

## 2017-02-17 ENCOUNTER — Ambulatory Visit: Payer: Self-pay | Admitting: Surgery

## 2017-02-17 NOTE — Progress Notes (Signed)
01-06-17 (EPIC) EKG, CXR

## 2017-02-17 NOTE — H&P (Signed)
Teresa Aguilar Location: [place] Office Patient #: [id number] DOB: [birth year] Married / Language: English / Race: White Female  History of Present Illness  The patient is a 56 year old female who presents for a bariatric surgery evaluation. Today her BMI is 42. She has diabetes and is followed by Dr. Kathryne Eriksson and on metformin. Her sleeve gastrectomy is scheduled for September 18. She has a history of DVT in her right neck and her right leg and is on Xarelto chronically. Her upper GI was negative for hiatal hernia or reflux.  I answered her questions. Her sister on whom I did a Roux-en-Y has done very well. She knows that this is a tool. I have asked her to speak with Dr. Kathryne Eriksson about a Lovenox bridge between coming off her Xarelto and preop.   Allergies Malachi Bonds, CMA; 02/17/2017 2:06 PM) Morphine Sulfate (Concentrate) *ANALGESICS - OPIOID* Sulfabenzamide *CHEMICALS*  Medication History (Chemira Jones, CMA; 02/17/2017 2:06 PM) Anastrozole (1MG  Tablet, Oral) Active. MetFORMIN HCl ER (500MG  Tablet ER 24HR, Oral) Active. Relpax (40MG  Tablet, Oral) Active. Xarelto (20MG  Tablet, Oral) Active. Medications Reconciled    Vitals  02/17/2017 2:04 PM Weight: 257.6 lb Height: 65.5in Body Surface Area: 2.22 m Body Mass Index: 42.21 kg/m  Temp.: 98.5F(Oral)  Pulse: 98 (Regular)  BP: 126/80 (Sitting, Left Arm, Standard)  Physical Exam (Adylynn Hertenstein B. Hassell Done MD; 02/17/2017 2:35 PM)  General Note: Obese WF in NAD HEENT some alopecia; glasses Neck supple Chest clear; prior right breast cancer with right sentinel node biopsy Heart SR without murmurs Abdomen nontender and no scars Ext prior right leg DVT  Assessment & Plan Rodman Key B. Hassell Done MD; 02/17/2017 2:37 PM)  MORBID OBESITY, UNSPECIFIED OBESITY TYPE (E66.01) Impression: Morbid obesity with BMI 41 and DM as well as hx of right breast cancer and on Xarelto for recurrent DVT. I have explained sleeve  gastrectomy to her. She is a good candidate for this and has a BMI of 42 with comorbidities. These include her diabetes for which she takes metformin will ask her to speak with Dr. Redmond Pulling about a Lovenox bridge between coming off her Xarelto beforeher surgery. We'll likely continue Lovenox for a period of time postop.  Matt B. Hassell Done, MD, FACS

## 2017-02-17 NOTE — Patient Instructions (Addendum)
Teresa Aguilar  02/17/2017   Your procedure is scheduled on: 03-02-17   Report to [facility] Main  Entrance Take [place]  Elevators to 3rd floor to  [place] at 8:00 AM.   Call this number if you have problems the morning of surgery [phone]   Remember: ONLY 1 PERSON MAY GO WITH YOU TO SHORT STAY TO GET  READY MORNING OF [name].  Do not eat food or drink liquids :After Midnight.     Take these medicines the morning of surgery with A SIP OF WATER: Anastrozole (Arimidex) DO NOT TAKE ANY DIABETIC MEDICATIONS DAY OF YOUR SURGERY                               You may not have any metal on your body including hair pins and              piercings  Do not wear jewelry, make-up, lotions, powders or perfumes, deodorant             Do not wear nail polish.  Do not shave  48 hours prior to surgery.               Do not bring valuables to the hospital. [place].  Contacts, dentures or bridgework may not be worn into surgery.  Leave suitcase in the car. After surgery it may be brought to your room.                 Please read over the following fact sheets you were given: _____________________________________________________________________  How to Manage Your Diabetes Before and After Surgery  Why is it important to control my blood sugar before and after surgery? . Improving blood sugar levels before and after surgery helps healing and can limit problems. . A way of improving blood sugar control is eating a healthy diet by: o  Eating less sugar and carbohydrates o  Increasing activity/exercise o  Talking with your doctor about reaching your blood sugar goals . High blood sugars (greater than 180 mg/dL) can raise your risk of infections and slow your recovery, so you will need to focus on controlling your diabetes during the weeks before surgery. . Make sure that the doctor who takes care of your  diabetes knows about your planned surgery including the date and location.  How do I manage my blood sugar before surgery? . Check your blood sugar at least 4 times a day, starting 2 days before surgery, to make sure that the level is not too high or low. o Check your blood sugar the morning of your surgery when you wake up and every 2 hours until you get to the Short Stay unit. . If your blood sugar is less than 70 mg/dL, you will need to treat for low blood sugar: o Do not take insulin. o Treat a low blood sugar (less than 70 mg/dL) with  cup of clear juice (cranberry or apple), 4 glucose tablets, OR glucose gel. o Recheck blood sugar in 15 minutes after treatment (to make sure it is greater than 70 mg/dL). If your blood sugar is not greater than 70 mg/dL on recheck, call [phone] for further instructions. . Report your blood sugar  to the short stay nurse when you get to Short Stay.  . If you are admitted to the hospital after surgery: o Your blood sugar will be checked by the staff and you will probably be given insulin after surgery (instead of oral diabetes medicines) to make sure you have good blood sugar levels. o The goal for blood sugar control after surgery is 80-180 mg/dL.   WHAT DO I DO ABOUT MY DIABETES MEDICATION?  Marland Kitchen Do not take oral diabetes medicines (pills) the morning of surgery.  . THE DAY BEFORE SURGERY, take your usual dose of Metformin       Patient Signature:  Date:   Nurse Signature:  Date:   Reviewed and Endorsed by [facility] Patient Education Committee, August 2015           [facility] - Preparing for Surgery Before surgery, you can play an important role.  Because skin is not sterile, your skin needs to be as free of germs as possible.  You can reduce the number of germs on your skin by washing with CHG (chlorahexidine gluconate) soap before surgery.  CHG is an antiseptic cleaner which kills germs and bonds with the skin to continue killing germs even  after washing. Please DO NOT use if you have an allergy to CHG or antibacterial soaps.  If your skin becomes reddened/irritated stop using the CHG and inform your nurse when you arrive at Short Stay. Do not shave (including legs and underarms) for at least 48 hours prior to the first CHG shower.  You may shave your face/neck. Please follow these instructions carefully:  1.  Shower with CHG Soap the night before surgery and the  morning of Surgery.  2.  If you choose to wash your hair, wash your hair first as usual with your  normal  shampoo.  3.  After you shampoo, rinse your hair and body thoroughly to remove the  shampoo.                           4.  Use CHG as you would any other liquid soap.  You can apply chg directly  to the skin and wash                       Gently with a scrungie or clean washcloth.  5.  Apply the CHG Soap to your body ONLY FROM THE NECK DOWN.   Do not use on face/ open                           Wound or open sores. Avoid contact with eyes, ears mouth and genitals (private parts).                       Wash face,  Genitals (private parts) with your normal soap.             6.  Wash thoroughly, paying special attention to the area where your surgery  will be performed.  7.  Thoroughly rinse your body with warm water from the neck down.  8.  DO NOT shower/wash with your normal soap after using and rinsing off  the CHG Soap.                9.  Pat yourself dry with a clean towel.  10.  Wear clean pajamas.            11.  Place clean sheets on your bed the night of your first shower and do not  sleep with pets. Day of Surgery : Do not apply any lotions/deodorants the morning of surgery.  Please wear clean clothes to the hospital/surgery center.  FAILURE TO FOLLOW THESE INSTRUCTIONS MAY RESULT IN THE CANCELLATION OF YOUR SURGERY PATIENT SIGNATURE_________________________________  NURSE  SIGNATURE__________________________________  ________________________________________________________________________   Adam Phenix  An incentive spirometer is a tool that can help keep your lungs clear and active. This tool measures how well you are filling your lungs with each breath. Taking long deep breaths may help reverse or decrease the chance of developing breathing (pulmonary) problems (especially infection) following:  A long period of time when you are unable to move or be active. BEFORE THE PROCEDURE   If the spirometer includes an indicator to show your best effort, your nurse or respiratory therapist will set it to a desired goal.  If possible, sit up straight or lean slightly forward. Try not to slouch.  Hold the incentive spirometer in an upright position. INSTRUCTIONS FOR USE  1. Sit on the edge of your bed if possible, or sit up as far as you can in bed or on a chair. 2. Hold the incentive spirometer in an upright position. 3. Breathe out normally. 4. Place the mouthpiece in your mouth and seal your lips tightly around it. 5. Breathe in slowly and as deeply as possible, raising the piston or the ball toward the top of the column. 6. Hold your breath for 3-5 seconds or for as long as possible. Allow the piston or ball to fall to the bottom of the column. 7. Remove the mouthpiece from your mouth and breathe out normally. 8. Rest for a few seconds and repeat Steps 1 through 7 at least 10 times every 1-2 hours when you are awake. Take your time and take a few normal breaths between deep breaths. 9. The spirometer may include an indicator to show your best effort. Use the indicator as a goal to work toward during each repetition. 10. After each set of 10 deep breaths, practice coughing to be sure your lungs are clear. If you have an incision (the cut made at the time of surgery), support your incision when coughing by placing a pillow or rolled up towels firmly  against it. Once you are able to get out of bed, walk around indoors and cough well. You may stop using the incentive spirometer when instructed by your caregiver.  RISKS AND COMPLICATIONS  Take your time so you do not get dizzy or light-headed.  If you are in pain, you may need to take or ask for pain medication before doing incentive spirometry. It is harder to take a deep breath if you are having pain. AFTER USE  Rest and breathe slowly and easily.  It can be helpful to keep track of a log of your progress. Your caregiver can provide you with a simple table to help with this. If you are using the spirometer at home, follow these instructions: [place] IF:   You are having difficultly using the spirometer.  You have trouble using the spirometer as often as instructed.  Your pain medication is not giving enough relief while using the spirometer.  You develop fever of 100.5 F (38.1 C) or higher. SEEK IMMEDIATE MEDICAL CARE IF:   You cough up bloody  sputum that had not been present before.  You develop fever of 102 F (38.9 C) or greater.  You develop worsening pain at or near the incision site. MAKE SURE YOU:   Understand these instructions.  Will watch your condition.  Will get help right away if you are not doing well or get worse. Document Released: 10/12/2006 Document Revised: 08/24/2011 Document Reviewed: 12/13/2006 [facility] Patient Information 2014 [place], [place].   ________________________________________________________________________

## 2017-02-19 ENCOUNTER — Encounter (HOSPITAL_COMMUNITY)
Admission: RE | Admit: 2017-02-19 | Discharge: 2017-02-19 | Disposition: A | Discharge status: Home / Self Care | Payer: BC Managed Care – PPO | Source: Ambulatory Visit | Attending: Surgery | Admitting: Surgery

## 2017-02-19 ENCOUNTER — Encounter (HOSPITAL_COMMUNITY): Payer: Self-pay

## 2017-02-19 ENCOUNTER — Other Ambulatory Visit: Payer: Self-pay | Admitting: Oncology

## 2017-02-19 DIAGNOSIS — Z01818 Encounter for other preprocedural examination: Secondary | ICD-10-CM | POA: Insufficient documentation

## 2017-02-19 DIAGNOSIS — E119 Type 2 diabetes mellitus without complications: Secondary | ICD-10-CM | POA: Insufficient documentation

## 2017-02-19 LAB — CBC WITH DIFFERENTIAL/PLATELET
BASOS ABS: 0 10*3/uL (ref 0.0–0.1)
Basophils Relative: 0 %
EOS ABS: 0 10*3/uL (ref 0.0–0.7)
EOS PCT: 1 %
HCT: 41 % (ref 36.0–46.0)
Hemoglobin: 13.9 g/dL (ref 12.0–15.0)
LYMPHS ABS: 0.9 10*3/uL (ref 0.7–4.0)
LYMPHS PCT: 23 %
MCH: 30.7 pg (ref 26.0–34.0)
MCHC: 33.9 g/dL (ref 30.0–36.0)
MCV: 90.5 fL (ref 78.0–100.0)
MONO ABS: 0.4 10*3/uL (ref 0.1–1.0)
Monocytes Relative: 10 %
Neutro Abs: 2.7 10*3/uL (ref 1.7–7.7)
Neutrophils Relative %: 66 %
PLATELETS: 136 10*3/uL — AB (ref 150–400)
RBC: 4.53 MIL/uL (ref 3.87–5.11)
RDW: 14 % (ref 11.5–15.5)
WBC: 4 10*3/uL (ref 4.0–10.5)

## 2017-02-19 LAB — COMPREHENSIVE METABOLIC PANEL
ALT: 30 U/L (ref 14–54)
AST: 30 U/L (ref 15–41)
Albumin: 4.2 g/dL (ref 3.5–5.0)
Alkaline Phosphatase: 68 U/L (ref 38–126)
Anion gap: 5 (ref 5–15)
BUN: 18 mg/dL (ref 6–20)
CHLORIDE: 107 mmol/L (ref 101–111)
CO2: 27 mmol/L (ref 22–32)
Calcium: 9.3 mg/dL (ref 8.9–10.3)
Creatinine, Ser: 0.77 mg/dL (ref 0.44–1.00)
Glucose, Bld: 159 mg/dL — ABNORMAL HIGH (ref 65–99)
POTASSIUM: 3.9 mmol/L (ref 3.5–5.1)
SODIUM: 139 mmol/L (ref 135–145)
Total Bilirubin: 0.8 mg/dL (ref 0.3–1.2)
Total Protein: 7.1 g/dL (ref 6.5–8.1)

## 2017-02-19 LAB — HEMOGLOBIN A1C
HEMOGLOBIN A1C: 6.6 % — AB (ref 4.8–5.6)
MEAN PLASMA GLUCOSE: 142.72 mg/dL

## 2017-02-19 LAB — GLUCOSE, CAPILLARY: GLUCOSE-CAPILLARY: 178 mg/dL — AB (ref 65–99)

## 2017-02-19 MED FILL — BARIAT ADVANT ADVMULT 60CT: 60 days supply | Qty: 60 | Fill #0

## 2017-02-19 MED FILL — oxyCODONE HCL 5 MG/5ML SOLN: 5 | 2 days supply | Qty: 120 | Fill #0

## 2017-03-01 NOTE — Progress Notes (Signed)
Informed patient of surgery time change. Instructed patient to arrive to Short Stay at 0530. NPO after midnight except sip of water to take meds. Patient verbalized understanding.

## 2017-03-02 ENCOUNTER — Inpatient Hospital Stay (HOSPITAL_COMMUNITY): Payer: BC Managed Care – PPO

## 2017-03-02 ENCOUNTER — Inpatient Hospital Stay (HOSPITAL_COMMUNITY)
Admission: RE | Admit: 2017-03-02 | Discharge: 2017-03-03 | DRG: 621 | Disposition: A | Discharge status: Home / Self Care | Payer: BC Managed Care – PPO | Source: Ambulatory Visit | Attending: Surgery | Admitting: Surgery

## 2017-03-02 ENCOUNTER — Encounter (HOSPITAL_COMMUNITY): Payer: Self-pay | Admitting: *Deleted

## 2017-03-02 ENCOUNTER — Encounter (HOSPITAL_COMMUNITY)
Admission: RE | Disposition: A | Discharge status: Home / Self Care | Payer: Self-pay | Source: Ambulatory Visit | Attending: Surgery

## 2017-03-02 DIAGNOSIS — R51 Headache: Secondary | ICD-10-CM | POA: Diagnosis present

## 2017-03-02 DIAGNOSIS — Z6841 Body Mass Index (BMI) 40.0 and over, adult: Secondary | ICD-10-CM

## 2017-03-02 DIAGNOSIS — Z9884 Bariatric surgery status: Secondary | ICD-10-CM

## 2017-03-02 DIAGNOSIS — I739 Peripheral vascular disease, unspecified: Secondary | ICD-10-CM | POA: Diagnosis present

## 2017-03-02 DIAGNOSIS — Z886 Allergy status to analgesic agent status: Secondary | ICD-10-CM

## 2017-03-02 DIAGNOSIS — M7989 Other specified soft tissue disorders: Secondary | ICD-10-CM | POA: Diagnosis not present

## 2017-03-02 DIAGNOSIS — Z885 Allergy status to narcotic agent status: Secondary | ICD-10-CM | POA: Diagnosis not present

## 2017-03-02 DIAGNOSIS — Z86718 Personal history of other venous thrombosis and embolism: Secondary | ICD-10-CM | POA: Diagnosis not present

## 2017-03-02 DIAGNOSIS — Z882 Allergy status to sulfonamides status: Secondary | ICD-10-CM

## 2017-03-02 DIAGNOSIS — Z79899 Other long term (current) drug therapy: Secondary | ICD-10-CM

## 2017-03-02 DIAGNOSIS — Z7901 Long term (current) use of anticoagulants: Secondary | ICD-10-CM

## 2017-03-02 DIAGNOSIS — Z853 Personal history of malignant neoplasm of breast: Secondary | ICD-10-CM | POA: Diagnosis not present

## 2017-03-02 DIAGNOSIS — E119 Type 2 diabetes mellitus without complications: Secondary | ICD-10-CM | POA: Diagnosis present

## 2017-03-02 HISTORY — PX: LAPAROSCOPIC GASTRIC SLEEVE RESECTION: SHX5895

## 2017-03-02 LAB — CREATININE, SERUM
Creatinine, Ser: 0.86 mg/dL (ref 0.44–1.00)
GFR calc non Af Amer: >60 mL/min (ref >60)

## 2017-03-02 LAB — CBC
HCT: 41.9 % (ref 36.0–46.0)
Hemoglobin: 14.4 g/dL (ref 12.0–15.0)
MCH: 31.4 pg (ref 26.0–34.0)
MCHC: 34.4 g/dL (ref 30.0–36.0)
MCV: 91.3 fL (ref 78.0–100.0)
PLATELETS: 102 10*3/uL — AB (ref 150–400)
RBC: 4.59 MIL/uL (ref 3.87–5.11)
RDW: 14 % (ref 11.5–15.5)
WBC: 7.2 10*3/uL (ref 4.0–10.5)

## 2017-03-02 LAB — GLUCOSE, CAPILLARY
Glucose-Capillary: 139 mg/dL — ABNORMAL HIGH (ref 65–99)
Glucose-Capillary: 178 mg/dL — ABNORMAL HIGH (ref 65–99)

## 2017-03-02 SURGERY — GASTRECTOMY, SLEEVE, LAPAROSCOPIC
Anesthesia: General | Site: Abdomen

## 2017-03-02 MED ORDER — LIDOCAINE 2% (20 MG/ML) 5 ML SYRINGE
INTRAMUSCULAR | Status: DC | PRN
  Administered 2017-03-02: 100 mg via INTRAVENOUS

## 2017-03-02 MED ORDER — DEXAMETHASONE SODIUM PHOSPHATE 4 MG/ML IJ SOLN: 4.0000 mg | INTRAMUSCULAR | Status: DC

## 2017-03-02 MED ORDER — MIDAZOLAM HCL 2 MG/2ML IJ SOLN
INTRAMUSCULAR | Status: AC
  Filled 2017-03-02: qty 2

## 2017-03-02 MED ORDER — SCOPOLAMINE 1 MG/3DAYS TD PT72: 1.0000 | MEDICATED_PATCH | Freq: Once | TRANSDERMAL | Status: DC

## 2017-03-02 MED ORDER — SODIUM CHLORIDE 0.9 % IJ SOLN
INTRAMUSCULAR | Status: AC
  Filled 2017-03-02: qty 10

## 2017-03-02 MED ORDER — CHLORHEXIDINE GLUCONATE CLOTH 2 % EX PADS: 6.0000 | MEDICATED_PAD | Freq: Once | CUTANEOUS | Status: DC

## 2017-03-02 MED ORDER — PHENOL 1.4 % MT LIQD
1.0000 | OROMUCOSAL | Status: DC | PRN
  Filled 2017-03-02: qty 177

## 2017-03-02 MED ORDER — MIDAZOLAM HCL 5 MG/5ML IJ SOLN
INTRAMUSCULAR | Status: DC | PRN
  Administered 2017-03-02: 2 mg via INTRAVENOUS

## 2017-03-02 MED ORDER — FENTANYL CITRATE (PF) 250 MCG/5ML IJ SOLN
INTRAMUSCULAR | Status: AC
  Filled 2017-03-02: qty 5

## 2017-03-02 MED ORDER — BUPIVACAINE LIPOSOME 1.3 % IJ SUSP
20.0000 mL | Freq: Once | INTRAMUSCULAR | Status: AC
  Administered 2017-03-02: 20 mL
  Filled 2017-03-02: qty 20

## 2017-03-02 MED ORDER — HYDROMORPHONE HCL-NACL 0.5-0.9 MG/ML-% IV SOSY: 0.2500 mg | PREFILLED_SYRINGE | INTRAVENOUS | Status: DC | PRN

## 2017-03-02 MED ORDER — LACTATED RINGERS IV SOLN
INTRAVENOUS | Status: DC | PRN
  Administered 2017-03-02: 07:00:00 via INTRAVENOUS

## 2017-03-02 MED ORDER — PNEUMOCOCCAL VAC POLYVALENT 25 MCG/0.5ML IJ INJ
0.5000 mL | INJECTION | INTRAMUSCULAR | Status: DC
  Filled 2017-03-02: qty 0.5

## 2017-03-02 MED ORDER — INFLUENZA VAC SPLIT QUAD 0.5 ML IM SUSY
0.5000 mL | PREFILLED_SYRINGE | INTRAMUSCULAR | Status: AC
  Administered 2017-03-03: 0.5 mL via INTRAMUSCULAR
  Filled 2017-03-02: qty 0.5

## 2017-03-02 MED ORDER — ONDANSETRON HCL 4 MG/2ML IJ SOLN
INTRAMUSCULAR | Status: DC | PRN
  Administered 2017-03-02: 4 mg via INTRAVENOUS

## 2017-03-02 MED ORDER — PREMIER PROTEIN SHAKE
2.0000 [oz_av] | ORAL | Status: DC
  Administered 2017-03-03 (×3): 2 [oz_av] via ORAL

## 2017-03-02 MED ORDER — ENOXAPARIN SODIUM 30 MG/0.3ML ~~LOC~~ SOLN: 30.0000 mg | Freq: Two times a day (BID) | SUBCUTANEOUS | Status: DC

## 2017-03-02 MED ORDER — LACTATED RINGERS IR SOLN
Status: DC | PRN
  Administered 2017-03-02: 1000 mL

## 2017-03-02 MED ORDER — HYDRALAZINE HCL 20 MG/ML IJ SOLN: 10.0000 mg | INTRAMUSCULAR | Status: DC | PRN

## 2017-03-02 MED ORDER — APREPITANT 40 MG PO CAPS
40.0000 mg | ORAL_CAPSULE | ORAL | Status: AC
  Administered 2017-03-02: 40 mg via ORAL
  Filled 2017-03-02: qty 1

## 2017-03-02 MED ORDER — DEXAMETHASONE SODIUM PHOSPHATE 10 MG/ML IJ SOLN
INTRAMUSCULAR | Status: DC | PRN
  Administered 2017-03-02: 10 mg via INTRAVENOUS

## 2017-03-02 MED ORDER — PROPOFOL 10 MG/ML IV BOLUS
INTRAVENOUS | Status: AC
  Filled 2017-03-02: qty 40

## 2017-03-02 MED ORDER — ACETAMINOPHEN 160 MG/5ML PO SOLN: 325.0000 mg | ORAL | Status: DC | PRN

## 2017-03-02 MED ORDER — FENTANYL CITRATE (PF) 100 MCG/2ML IJ SOLN
12.5000 ug | INTRAMUSCULAR | Status: DC | PRN
  Administered 2017-03-02 – 2017-03-03 (×5): 12.5 ug via INTRAVENOUS
  Filled 2017-03-02 (×5): qty 2

## 2017-03-02 MED ORDER — 0.9 % SODIUM CHLORIDE (POUR BTL) OPTIME
TOPICAL | Status: DC | PRN
  Administered 2017-03-02: 1000 mL

## 2017-03-02 MED ORDER — CEFOTETAN DISODIUM-DEXTROSE 2-2.08 GM-% IV SOLR
2.0000 g | INTRAVENOUS | Status: AC
  Administered 2017-03-02: 2 g via INTRAVENOUS

## 2017-03-02 MED ORDER — KCL IN DEXTROSE-NACL 20-5-0.45 MEQ/L-%-% IV SOLN
INTRAVENOUS | Status: DC
  Administered 2017-03-02 – 2017-03-03 (×2): via INTRAVENOUS
  Administered 2017-03-03: 100 mL/h via INTRAVENOUS
  Filled 2017-03-02 (×3): qty 1000

## 2017-03-02 MED ORDER — PROMETHAZINE HCL 25 MG/ML IJ SOLN: 6.2500 mg | INTRAMUSCULAR | Status: DC | PRN

## 2017-03-02 MED ORDER — PROPOFOL 10 MG/ML IV BOLUS
INTRAVENOUS | Status: DC | PRN
  Administered 2017-03-02: 200 mg via INTRAVENOUS

## 2017-03-02 MED ORDER — SUCCINYLCHOLINE CHLORIDE 200 MG/10ML IV SOSY
PREFILLED_SYRINGE | INTRAVENOUS | Status: DC | PRN
  Administered 2017-03-02: 120 mg via INTRAVENOUS

## 2017-03-02 MED ORDER — PANTOPRAZOLE SODIUM 40 MG IV SOLR
40.0000 mg | Freq: Every day | INTRAVENOUS | Status: DC
  Administered 2017-03-02: 40 mg via INTRAVENOUS
  Filled 2017-03-02: qty 40

## 2017-03-02 MED ORDER — SODIUM CHLORIDE 0.9 % IJ SOLN
INTRAMUSCULAR | Status: DC | PRN
  Administered 2017-03-02: 10 mL

## 2017-03-02 MED ORDER — ACETAMINOPHEN 325 MG PO TABS: 650.0000 mg | ORAL_TABLET | ORAL | Status: DC | PRN

## 2017-03-02 MED ORDER — PHENYLEPHRINE HCL 10 MG/ML IJ SOLN
INTRAMUSCULAR | Status: DC | PRN
  Administered 2017-03-02: 40 ug via INTRAVENOUS

## 2017-03-02 MED ORDER — OXYCODONE HCL 5 MG/5ML PO SOLN
5.0000 mg | ORAL | Status: DC | PRN
  Administered 2017-03-03 (×2): 10 mg via ORAL
  Filled 2017-03-02 (×2): qty 10

## 2017-03-02 MED ORDER — ROCURONIUM BROMIDE 50 MG/5ML IV SOSY
PREFILLED_SYRINGE | INTRAVENOUS | Status: AC
  Filled 2017-03-02: qty 5

## 2017-03-02 MED ORDER — ENOXAPARIN (LOVENOX) PATIENT EDUCATION KIT
PACK | Freq: Once | Status: AC
  Administered 2017-03-02: 16:00:00
  Filled 2017-03-02: qty 1

## 2017-03-02 MED ORDER — MEPERIDINE HCL 50 MG/ML IJ SOLN: 6.2500 mg | INTRAMUSCULAR | Status: DC | PRN

## 2017-03-02 MED ORDER — SCOPOLAMINE 1 MG/3DAYS TD PT72
1.0000 | MEDICATED_PATCH | TRANSDERMAL | Status: DC
  Administered 2017-03-02: 1.5 mg via TRANSDERMAL
  Filled 2017-03-02: qty 1

## 2017-03-02 MED ORDER — SUGAMMADEX SODIUM 500 MG/5ML IV SOLN
INTRAVENOUS | Status: DC | PRN
  Administered 2017-03-02: 300 mg via INTRAVENOUS

## 2017-03-02 MED ORDER — GABAPENTIN 300 MG PO CAPS
300.0000 mg | ORAL_CAPSULE | ORAL | Status: AC
  Administered 2017-03-02: 300 mg via ORAL
  Filled 2017-03-02: qty 1

## 2017-03-02 MED ORDER — OXYCODONE HCL 5 MG PO TABS: 5.0000 mg | ORAL_TABLET | Freq: Once | ORAL | Status: DC | PRN

## 2017-03-02 MED ORDER — CEFOTETAN DISODIUM-DEXTROSE 2-2.08 GM-% IV SOLR
INTRAVENOUS | Status: AC
  Filled 2017-03-02: qty 50

## 2017-03-02 MED ORDER — ROCURONIUM BROMIDE 10 MG/ML (PF) SYRINGE
PREFILLED_SYRINGE | INTRAVENOUS | Status: DC | PRN
  Administered 2017-03-02 (×2): 10 mg via INTRAVENOUS
  Administered 2017-03-02: 40 mg via INTRAVENOUS
  Administered 2017-03-02 (×2): 10 mg via INTRAVENOUS

## 2017-03-02 MED ORDER — ONDANSETRON HCL 4 MG/2ML IJ SOLN
4.0000 mg | INTRAMUSCULAR | Status: DC | PRN
  Administered 2017-03-02: 4 mg via INTRAVENOUS
  Filled 2017-03-02: qty 2

## 2017-03-02 MED ORDER — ENOXAPARIN SODIUM 30 MG/0.3ML ~~LOC~~ SOLN
30.0000 mg | Freq: Two times a day (BID) | SUBCUTANEOUS | Status: DC
  Administered 2017-03-02 – 2017-03-03 (×2): 30 mg via SUBCUTANEOUS
  Filled 2017-03-02 (×2): qty 0.3

## 2017-03-02 MED ORDER — STERILE WATER FOR IRRIGATION IR SOLN
Status: DC | PRN
  Administered 2017-03-02: 500 mL

## 2017-03-02 MED ORDER — OXYCODONE HCL 5 MG/5ML PO SOLN
5.0000 mg | Freq: Once | ORAL | Status: DC | PRN
  Filled 2017-03-02: qty 5

## 2017-03-02 MED ORDER — FENTANYL CITRATE (PF) 100 MCG/2ML IJ SOLN
INTRAMUSCULAR | Status: DC | PRN
  Administered 2017-03-02: 100 ug via INTRAVENOUS
  Administered 2017-03-02 (×2): 50 ug via INTRAVENOUS

## 2017-03-02 MED ORDER — ENOXAPARIN SODIUM 40 MG/0.4ML ~~LOC~~ SOLN
40.0000 mg | SUBCUTANEOUS | Status: AC
  Administered 2017-03-02: 40 mg via SUBCUTANEOUS
  Filled 2017-03-02: qty 0.4

## 2017-03-02 SURGICAL SUPPLY — 66 items
ADH SKN CLS APL DERMABOND .7 (GAUZE/BANDAGES/DRESSINGS) ×2
APPLICATOR COTTON TIP 6IN STRL (MISCELLANEOUS) IMPLANT
APPLIER CLIP 5 13 M/L LIGAMAX5 (MISCELLANEOUS)
APPLIER CLIP ROT 10 11.4 M/L (STAPLE) ×3
APPLIER CLIP ROT 13.4 12 LRG (CLIP) ×3
APR CLP LRG 13.4X12 ROT 20 MLT (CLIP) ×1
APR CLP MED LRG 11.4X10 (STAPLE) ×1
APR CLP MED LRG 5 ANG JAW (MISCELLANEOUS)
BLADE SURG 15 STRL LF DISP TIS (BLADE) ×1 IMPLANT
BLADE SURG 15 STRL SS (BLADE) ×3
CABLE HIGH FREQUENCY MONO STRZ (ELECTRODE) ×3 IMPLANT
CLIP APPLIE 5 13 M/L LIGAMAX5 (MISCELLANEOUS) IMPLANT
CLIP APPLIE ROT 10 11.4 M/L (STAPLE) IMPLANT
CLIP APPLIE ROT 13.4 12 LRG (CLIP) IMPLANT
DERMABOND ADVANCED (GAUZE/BANDAGES/DRESSINGS) ×4
DERMABOND ADVANCED .7 DNX12 (GAUZE/BANDAGES/DRESSINGS) IMPLANT
DEVICE SUT QUICK LOAD TK 5 (STAPLE) IMPLANT
DEVICE SUT TI-KNOT TK 5X26 (MISCELLANEOUS) IMPLANT
DEVICE SUTURE ENDOST 10MM (ENDOMECHANICALS) IMPLANT
DEVICE TI KNOT TK5 (MISCELLANEOUS)
DEVICE TROCAR PUNCTURE CLOSURE (ENDOMECHANICALS) ×3 IMPLANT
DISSECTOR BLUNT TIP ENDO 5MM (MISCELLANEOUS) IMPLANT
ELECT REM PT RETURN 15FT ADLT (MISCELLANEOUS) ×3 IMPLANT
GAUZE SPONGE 4X4 12PLY STRL (GAUZE/BANDAGES/DRESSINGS) IMPLANT
GLOVE BIOGEL M 8.0 STRL (GLOVE) ×3 IMPLANT
GOWN STRL REUS W/TWL XL LVL3 (GOWN DISPOSABLE) ×14 IMPLANT
GRASPER SUT TROCAR 14GX15 (MISCELLANEOUS) ×2 IMPLANT
HANDLE STAPLE EGIA 4 XL (STAPLE) ×3 IMPLANT
HOVERMATT SINGLE USE (MISCELLANEOUS) ×3 IMPLANT
KIT BASIN OR (CUSTOM PROCEDURE TRAY) ×3 IMPLANT
MARKER SKIN DUAL TIP RULER LAB (MISCELLANEOUS) ×3 IMPLANT
NDL SPNL 22GX3.5 QUINCKE BK (NEEDLE) ×1 IMPLANT
NEEDLE SPNL 22GX3.5 QUINCKE BK (NEEDLE) ×3 IMPLANT
PACK UNIVERSAL I (CUSTOM PROCEDURE TRAY) ×3 IMPLANT
QUICK LOAD TK 5 (STAPLE)
RELOAD STAPLE 45 PURP MED/THCK (STAPLE) IMPLANT
RELOAD TRI 45 ART MED THCK BLK (STAPLE) ×3 IMPLANT
RELOAD TRI 45 ART MED THCK PUR (STAPLE) IMPLANT
RELOAD TRI 60 ART MED THCK BLK (STAPLE) ×3 IMPLANT
RELOAD TRI 60 ART MED THCK PUR (STAPLE) ×6 IMPLANT
SCISSORS LAP 5X45 EPIX DISP (ENDOMECHANICALS) IMPLANT
SET IRRIG TUBING LAPAROSCOPIC (IRRIGATION / IRRIGATOR) ×3 IMPLANT
SHEARS HARMONIC ACE PLUS 45CM (MISCELLANEOUS) ×3 IMPLANT
SLEEVE ADV FIXATION 5X100MM (TROCAR) ×6 IMPLANT
SLEEVE GASTRECTOMY 36FR VISIGI (MISCELLANEOUS) ×3 IMPLANT
SOLUTION ANTI FOG 6CC (MISCELLANEOUS) ×3 IMPLANT
SPONGE LAP 18X18 X RAY DECT (DISPOSABLE) ×3 IMPLANT
STAPLER VISISTAT 35W (STAPLE) ×3 IMPLANT
SUT MNCRL AB 4-0 PS2 18 (SUTURE) ×5 IMPLANT
SUT SURGIDAC NAB ES-9 0 48 120 (SUTURE) IMPLANT
SUT VIC AB 4-0 SH 18 (SUTURE) ×1 IMPLANT
SUT VICRYL 0 TIES 12 18 (SUTURE) ×3 IMPLANT
SYR 10ML ECCENTRIC (SYRINGE) ×3 IMPLANT
SYR 20CC LL (SYRINGE) ×3 IMPLANT
SYR 50ML LL SCALE MARK (SYRINGE) ×3 IMPLANT
TOWEL OR 17X26 10 PK STRL BLUE (TOWEL DISPOSABLE) ×6 IMPLANT
TOWEL OR NON WOVEN STRL DISP B (DISPOSABLE) ×3 IMPLANT
TRAY FOLEY W/METER SILVER 16FR (SET/KITS/TRAYS/PACK) IMPLANT
TROCAR ADV FIXATION 5X100MM (TROCAR) ×3 IMPLANT
TROCAR BLADELESS 15MM (ENDOMECHANICALS) ×3 IMPLANT
TROCAR BLADELESS OPT 5 100 (ENDOMECHANICALS) ×3 IMPLANT
TUBE CALIBRATION LAPBAND (TUBING) IMPLANT
TUBING CONNECTING 10 (TUBING) ×3 IMPLANT
TUBING CONNECTING 10' (TUBING) ×2
TUBING ENDO SMARTCAP (MISCELLANEOUS) ×3 IMPLANT
TUBING INSUF HEATED (TUBING) ×3 IMPLANT

## 2017-03-02 NOTE — Transfer of Care (Signed)
Immediate Anesthesia Transfer of Care Note  Patient: Teresa Aguilar  Procedure(s) Performed: Procedure(s): LAPAROSCOPIC GASTRIC SLEEVE RESECTION WITH UPPER ENDOSCOPY (N/A)  Patient Location: PACU  Anesthesia Type:General  Level of Consciousness: sedated  Airway & Oxygen Therapy: Patient Spontanous Breathing and Patient connected to face mask oxygen  Post-op Assessment: Report given to RN and Post -op Vital signs reviewed and stable  Post vital signs: Reviewed and stable  Last Vitals:  Vitals:   03/02/17 0515 03/02/17 0549  BP: 132/81   Pulse: (!) 115 100  Resp: 18   Temp: 36.5 C   SpO2: 99%     Last Pain:  Vitals:   03/02/17 0515  TempSrc: Oral      Patients Stated Pain Goal: 3 (94/70/96 2836)  Complications: No apparent anesthesia complications

## 2017-03-02 NOTE — Progress Notes (Signed)
Spoke with Dr Hassell Done, Lovenox SQ to start tonight at 8 pm.  Pharmacy made aware.

## 2017-03-02 NOTE — Interval H&P Note (Signed)
History and Physical Interval Note:  03/02/2017 7:10 AM  Teresa Aguilar  has presented today for surgery, with the diagnosis of MORBID OBESITY  The various methods of treatment have been discussed with the patient and family. After consideration of risks, benefits and other options for treatment, the patient has consented to  Procedure(s): LAPAROSCOPIC GASTRIC SLEEVE RESECTION WITH UPPER ENDO (N/A) as a surgical intervention .  The patient's history has been reviewed, patient examined, no change in status, stable for surgery.  I have reviewed the patient's chart and labs.  Questions were answered to the patient's satisfaction.     Akiva Brassfield B

## 2017-03-02 NOTE — Op Note (Signed)
Preoperative diagnosis: laparoscopic sleeve gastrectomy  Postoperative diagnosis: Same   Procedure: Upper endoscopy   Surgeon: Jaquaveon Bilal, M.D.  Anesthesia: Gen.   Indications for procedure: This patient was undergoing a laparoscopic sleeve gastrectomy.   Description of procedure: The endoscopy was placed in the mouth and into the oropharynx and under endoscopic vision it was advanced to the esophagogastric junction. The pouch was insufflated and no bleeding or bubbles were seen. The GEJ was identified at 42cm from the teeth. No bleeding or leaks were detected. The scope was withdrawn without difficulty.   Telsa Dillavou, M.D. General, Bariatric, & Minimally Invasive Surgery [facility]    

## 2017-03-02 NOTE — Anesthesia Postprocedure Evaluation (Signed)
Anesthesia Post Note  Patient: Teresa Aguilar  Procedure(s) Performed: Procedure(s) (LRB): LAPAROSCOPIC GASTRIC SLEEVE RESECTION WITH UPPER ENDOSCOPY (N/A)     Patient location during evaluation: PACU Anesthesia Type: General Level of consciousness: awake and alert Pain management: pain level controlled Vital Signs Assessment: post-procedure vital signs reviewed and stable Respiratory status: spontaneous breathing, nonlabored ventilation and respiratory function stable Cardiovascular status: blood pressure returned to baseline and stable Postop Assessment: no apparent nausea or vomiting Anesthetic complications: no    Last Vitals:  Vitals:   03/02/17 1135 03/02/17 1150  BP:  (!) 156/85  Pulse: 71 83  Resp: 19 18  Temp: 36.4 C 36.5 C  SpO2: 100% 100%    Last Pain:  Vitals:   03/02/17 1200  TempSrc:   PainSc: 0-No pain                 Lynda Rainwater

## 2017-03-02 NOTE — Op Note (Signed)
Surgeon: Kaylyn Lim, MD, FACS  Asst:  Gurney Maxin, MD,FACS  Anes:  General endotracheal  Procedure: Laparoscopic sleeve gastrectomy and upper endoscopy  Diagnosis: Morbid obesity and history of DVT on chronic Xarelto per Dr. Kathryne Eriksson  Complications: None noted  EBL:   5 cc  Description of Procedure:  The patient was take to OR 1 and given general anesthesia.  The abdomen was prepped with Technicare and draped sterilely.  A timeout was performed.  Access to the abdomen was achieved with a 5 mm Optiview through the left upper quadrant without difficulty.  Following insufflation, the state of the abdomen was found to be free of adhesions.  No hiatal hernia was seen and the UGI was normal-patient had no symptoms of GER.  The ViSiGi 36Fr tube was inserted to deflate the stomach and was pulled back into the esophagus.    The pylorus was identified and we measured 5 cm back and marked the antrum.  At that point we began dissection to take down the greater curvature of the stomach using the Harmonic scalpel.  This dissection was taken all the way up to the left crus.  Large vessels near the spleen with clipped before division with the Harmonic scalpel.   Posterior attachments of the stomach were also taken down.    The ViSiGi tube was then passed into the antrum and suction applied so that it was snug along the lessor curvature.  The "crow's foot" or incisura was identified.  The sleeve gastrectomy was begun using the [employer] stapler beginning with a 4.5 cm black load followed by a 6 cm black load and then purple loads.  All loads had TRS.  When the sleeve was complete the tube was taken off suction and insufflated briefly.  The tube was withdrawn.  Upper endoscopy was then performed by Dr. Kieth Brightly and good symmetry and no bleeding was noted.  No bubbles were seen.     The specimen was extracted through the 15 trocar site.  Wounds were infiltrated with Exparel and closed with 4-0  monocryl.  The 15 mm port was closed with a PRI and 0 vicryl.    Matt B. Hassell Done, [place], [facility], [place]

## 2017-03-02 NOTE — Anesthesia Preprocedure Evaluation (Signed)
Anesthesia Evaluation  Patient identified by MRN, date of birth, ID band Patient awake    Reviewed: Allergy & Precautions, NPO status , Patient's Chart, lab work & pertinent test results  History of Anesthesia Complications (+) PONV  Airway Mallampati: II  TM Distance: >3 FB Neck ROM: Full    Dental no notable dental hx.    Pulmonary    Pulmonary exam normal breath sounds clear to auscultation       Cardiovascular hypertension, Pt. on medications + Peripheral Vascular Disease  Normal cardiovascular exam Rhythm:Regular Rate:Normal     Neuro/Psych  Headaches, Anxiety    GI/Hepatic GERD  ,  Endo/Other  diabetes, Type 2, Oral Hypoglycemic AgentsMorbid obesity  Renal/GU      Musculoskeletal  (+) Arthritis ,   Abdominal   Peds  Hematology   Anesthesia Other Findings HX of Colon CA  Reproductive/Obstetrics                             Anesthesia Physical  Anesthesia Plan  ASA: III  Anesthesia Plan: General   Post-op Pain Management:    Induction: Intravenous  PONV Risk Score and Plan: 4 or greater and Ondansetron, Dexamethasone, Midazolam, Scopolamine patch - Pre-op and Treatment may vary due to age or medical condition  Airway Management Planned: Oral ETT  Additional Equipment:   Intra-op Plan:   Post-operative Plan: Extubation in OR  Informed Consent: I have reviewed the patients History and Physical, chart, labs and discussed the procedure including the risks, benefits and alternatives for the proposed anesthesia with the patient or authorized representative who has indicated his/her understanding and acceptance.     Plan Discussed with: CRNA, Anesthesiologist and Surgeon  Anesthesia Plan Comments:         Anesthesia Quick Evaluation

## 2017-03-02 NOTE — Progress Notes (Signed)
Call made to [facility], Dr. Hassell Done, to request IV pain medication for patient. Spoke with Abigail Butts who will have MD call me. Donne Hazel, RN

## 2017-03-02 NOTE — Anesthesia Procedure Notes (Signed)
Procedure Name: Intubation Date/Time: 03/02/2017 7:33 AM Performed by: Lind Covert Pre-anesthesia Checklist: Patient identified, Emergency Drugs available, Suction available and Patient being monitored Patient Re-evaluated:Patient Re-evaluated prior to induction Oxygen Delivery Method: Circle system utilized Preoxygenation: Pre-oxygenation with 100% oxygen Induction Type: IV induction Ventilation: Mask ventilation without difficulty Laryngoscope Size: Mac and 3 Grade View: Grade I Tube type: Oral Tube size: 7.5 mm Number of attempts: 1 Airway Equipment and Method: Stylet and Oral airway Placement Confirmation: ETT inserted through vocal cords under direct vision,  positive ETCO2 and breath sounds checked- equal and bilateral Secured at: 22 cm Tube secured with: Tape Dental Injury: Teeth and Oropharynx as per pre-operative assessment

## 2017-03-02 NOTE — Progress Notes (Deleted)
Patient continues to have brown liquid stools. Donne Hazel, RN

## 2017-03-02 NOTE — Progress Notes (Signed)
Patient awake, complaints of pain.  Receiving IV pain medication.  Discussed ambulation to decrease pain from gas postop.  Patient aware goals for progression of diet.  Including ambulation, urination, and vital signs stables.  Encourage IS.  Oral care provided to patient.  Family at bedside.  No questions at this time.

## 2017-03-03 ENCOUNTER — Inpatient Hospital Stay (HOSPITAL_COMMUNITY): Payer: BC Managed Care – PPO

## 2017-03-03 DIAGNOSIS — M7989 Other specified soft tissue disorders: Secondary | ICD-10-CM

## 2017-03-03 LAB — CBC WITH DIFFERENTIAL/PLATELET
BASOS ABS: 0 10*3/uL (ref 0.0–0.1)
Basophils Relative: 0 %
EOS PCT: 0 %
Eosinophils Absolute: 0 10*3/uL (ref 0.0–0.7)
HEMATOCRIT: 38.7 % (ref 36.0–46.0)
HEMOGLOBIN: 13 g/dL (ref 12.0–15.0)
LYMPHS ABS: 0.6 10*3/uL — AB (ref 0.7–4.0)
LYMPHS PCT: 10 %
MCH: 31.1 pg (ref 26.0–34.0)
MCHC: 33.6 g/dL (ref 30.0–36.0)
MCV: 92.6 fL (ref 78.0–100.0)
Monocytes Absolute: 0.5 10*3/uL (ref 0.1–1.0)
Monocytes Relative: 8 %
NEUTROS ABS: 5.6 10*3/uL (ref 1.7–7.7)
NEUTROS PCT: 83 %
PLATELETS: 102 10*3/uL — AB (ref 150–400)
RBC: 4.18 MIL/uL (ref 3.87–5.11)
RDW: 14 % (ref 11.5–15.5)
WBC: 6.7 10*3/uL (ref 4.0–10.5)

## 2017-03-03 MED ORDER — ENOXAPARIN SODIUM 30 MG/0.3ML ~~LOC~~ SOLN: 30.0000 mg | Freq: Two times a day (BID) | SUBCUTANEOUS | 0 refills | Status: DC

## 2017-03-03 NOTE — Progress Notes (Signed)
Education provided regarding self administration of Lovenox. Patient is familiar with administration and has no further questions. Also provided education to patient not to drink protein too fast, as she is having "a little pain" in LUQ. Donne Hazel, RN

## 2017-03-03 NOTE — Progress Notes (Signed)
Pt given discharge instructions and care notes. Pt verbalized understanding AEB no further questions or concerns at this time. IV was discontinued. Pt left the floor via wheelchair with staff in stable condition.

## 2017-03-03 NOTE — Progress Notes (Signed)
Patient alert and oriented, pain is controlled. Patient is tolerating fluids, advanced to protein shake today, patient is tolerating well.  Reviewed Gastric sleeve discharge instructions with patient and patient is able to articulate understanding.  Provided information on BELT program, Support Group and [facility] outpatient pharmacy. All questions answered, will continue to monitor.  

## 2017-03-03 NOTE — Progress Notes (Signed)
[place] for Lovenox for bridging back to Xarelto Indication: DVT  Allergies  Allergen Reactions  . Morphine And Related Nausea And Vomiting  . Sulfa Antibiotics Hives  . Meloxicam Rash   Patient Measurements: Height: 5' 5.5" (166.4 cm) Weight: 252 lb (114.3 kg) IBW/kg (Calculated) : 58.15  Vital Signs: Temp: 97.9 F (36.6 C) (09/19 0404) Temp Source: Oral (09/19 0404) BP: 141/87 (09/19 0404) Pulse Rate: 74 (09/19 0404)  Labs:  Recent Labs  03/02/17 1437 03/03/17 0517  HGB 14.4 13.0  HCT 41.9 38.7  PLT 102* 102*  CREATININE 0.86  --    Estimated Creatinine Clearance: 91.8 mL/min (by C-G formula based on SCr of 0.86 mg/dL).  Medical History: Past Medical History:  Diagnosis Date  . Anxiety    relative for to pain & frustration of prev. hospitalization   . Arthritis    degenerative lumbar spine    . Breast cancer ([place]) 2016  . Breast cancer of lower-inner quadrant of right female breast ([place])   . Chronic back pain   . Colon cancer ([place])   . DVT (deep venous thrombosis) ([place]) 03/2014   R leg, post op- on Xarelto   . Family history of adverse reaction to anesthesia    PONV  . Family history of breast cancer   . Family history of colon cancer   . GERD (gastroesophageal reflux disease)    used during chemo, has not taken recently  . Gestational diabetes 1986; 1996  . H/O cardiovascular stress test 2012    test done for stress from her Father dying, had chest pain ,pt. had a stress/echo  test, told it was wnl  . Hypertension    pt. reports that she has been higher in the past & again now but doesn't take any med.,never has for ^BP, pt. stating that BP is high now b/c of her pain in her back.    . Hypoxia    after surgery  . Migraines    "at least q other week" (08/27/2014)  . Personal history of radiation therapy   . PONV (postoperative nausea and vomiting)    low O2 sats, < 50% post op  . Radiation 01/17/15-03/06/15   right breast 50.4 gray, lumpectomy cavity boost to 12 gray    Medications:  Scheduled:  . enoxaparin (LOVENOX) injection  30 mg Subcutaneous Q12H  . Influenza vac split quadrivalent PF  0.5 mL Intramuscular Tomorrow-1000  . pantoprazole (PROTONIX) IV  40 mg Intravenous QHS  . pneumococcal 23 valent vaccine  0.5 mL Intramuscular Tomorrow-1000  . protein supplement shake  2 oz Oral Q2H   Assessment: POD1 s/- Lap sleeve Gastrectomy, previous Xarelto 20mg  daily for hx of DVT, last dose 9/12 Plan LE doppler today, discharge on Lovenox bridge  Goal of Therapy:  Monitor platelets by anticoagulation protocol: Yes   Plan:   Discussed with Dr. Hassell Done, concern for bleed if therapeutic Lovenox ordered, Platelets 102K  Continue Lovenox 30mg  SQ q12, discharge on Lovenox, PCP to resume Xarelto  Lekia Nier L 03/03/2017,8:57 AM

## 2017-03-03 NOTE — Progress Notes (Signed)
Patient alert and oriented, Post op day 1.  Provided support and encouragement.  Encouraged pulmonary toilet, ambulation and small sips of liquids. Completed 12 ounces of clear fluid. 2 ounces of protein.  Patient to discharge home on Lovenox per Dr Hassell Done.  Pharmacy to consult regarding Lovenox bridge.  All questions answered.  Will continue to monitor.

## 2017-03-03 NOTE — Progress Notes (Signed)
Patient called primary care doctor for an appointment next week.  Patient does not have CBG meter, Primary Care Kathryne Eriksson MD to order for continued monitoring.  Patient will discontinue Metformin at discharge per Dr Hassell Done verbal instructions.  Patient and family aware of the dc of Metformin.  Instructed patient to call Dr Redmond Pulling if blood sugars elevate upon discharge above 200.

## 2017-03-03 NOTE — Progress Notes (Signed)
VASCULAR LAB PRELIMINARY  PRELIMINARY  PRELIMINARY  PRELIMINARY  Bilateral lower extremity venous duplex completed.    Preliminary report:  Bilateral:  No evidence of DVT, superficial thrombosis, or Baker's Cyst.   Elira Colasanti, RVS 03/03/2017, 1:25 PM

## 2017-03-03 NOTE — Discharge Summary (Signed)
Physician Discharge Summary  Patient ID: TRANIECE BOFFA MRN: [id number] DOB/AGE: 02-19-1961 56 y.o.  Admit date: 03/02/2017 Discharge date: 03/03/2017  Admission Diagnoses:  Morbid obesity, hx breast cancer and hx DVT  Discharge Diagnoses:  same  Principal Problem:   S/P laparoscopic sleeve gastrectomy Sept 2018   Surgery:  Lap sleeve gastrectomy  Discharged Condition: improved  Hospital Course:   Had surgery on Tuesday.  Started liquids.  DVT study of lower extremity on PD 1 was negative.  Arrangements made to go home on Lovenox for 5 days prophylaxis and then resume Xarelto  Consults: Pharmacy  Significant Diagnostic Studies: DVT study was negative    Discharge Exam: Blood pressure 111/60, pulse 80, temperature 98.5 F (36.9 C), temperature source Oral, resp. rate 16, height 5' 5.5" (1.664 m), weight 114.3 kg (252 lb), SpO2 96 %. Incisions OK.    Disposition: 01-Home or Self Care  Discharge Instructions    Ambulate hourly while awake    Complete by:  As directed    Call MD for:  difficulty breathing, headache or visual disturbances    Complete by:  As directed    Call MD for:  persistant dizziness or light-headedness    Complete by:  As directed    Call MD for:  persistant nausea and vomiting    Complete by:  As directed    Call MD for:  redness, tenderness, or signs of infection (pain, swelling, redness, odor or green/yellow discharge around incision site)    Complete by:  As directed    Call MD for:  severe uncontrolled pain    Complete by:  As directed    Call MD for:  temperature >101 F    Complete by:  As directed    Diet bariatric full liquid    Complete by:  As directed    Discharge instructions    Complete by:  As directed    Lovenox prophylaxis for 5 days then resume Xarelto.  Please call Dr. Redmond Pulling and let him know.  Thanks   Incentive spirometry    Complete by:  As directed    Perform hourly while awake     Allergies as of 03/03/2017    Reactions   Morphine And Related Nausea And Vomiting   Sulfa Antibiotics Hives   Meloxicam Rash      Medication List    STOP taking these medications   metFORMIN 500 MG 24 hr tablet Commonly known as:  GLUCOPHAGE-XR   rivaroxaban 20 MG Tabs tablet Commonly known as:  XARELTO     TAKE these medications   anastrozole 1 MG tablet Commonly known as:  ARIMIDEX TAKE 1 TABLET BY MOUTH EVERY DAY   eletriptan 40 MG tablet Commonly known as:  RELPAX Take 40 mg by mouth every 2 (two) hours as needed for migraine. Reported on 10/01/2015            Discharge Care Instructions        Start     Ordered   03/03/17 0000  Diet bariatric full liquid     03/03/17 1416   03/03/17 0000  Ambulate hourly while awake     03/03/17 1416   03/03/17 0000  Incentive spirometry    Comments:  Perform hourly while awake   03/03/17 1416   03/03/17 0000  Call MD for:  temperature >101 F     03/03/17 1416   03/03/17 0000  Call MD for:  persistant nausea and vomiting  03/03/17 1416   03/03/17 0000  Call MD for:  severe uncontrolled pain     03/03/17 1416   03/03/17 0000  Call MD for:  redness, tenderness, or signs of infection (pain, swelling, redness, odor or green/yellow discharge around incision site)     03/03/17 1416   03/03/17 0000  Call MD for:  difficulty breathing, headache or visual disturbances     03/03/17 1416   03/03/17 0000  Call MD for:  persistant dizziness or light-headedness     03/03/17 1416   03/03/17 0000  Discharge instructions    Comments:  Lovenox prophylaxis for 5 days then resume Xarelto.  Please call Dr. Redmond Pulling and let him know.  Thanks   03/03/17 1416     Follow-up Information    [name], MD. Go on 03/17/2017.   Specialty:  General Surgery Why:  at [address] in [facility] information: [place] [address] [phone]        [name], MD Follow up.   Specialty:  General Surgery Contact information: [address]  [place] [address] [phone]           Signed: Pedro Earls 03/03/2017, 2:16 PM

## 2017-03-03 NOTE — Plan of Care (Signed)
Problem: Food- and Nutrition-Related Knowledge Deficit (NB-1.1) Goal: Nutrition education Formal process to instruct or train a patient/client in a skill or to impart knowledge to help patients/clients voluntarily manage or modify food choices and eating behavior to maintain or improve health. Outcome: Completed/Met Date Met: 03/03/17 Nutrition Education Note  Received consult for diet education per DROP protocol.   Discussed 2 week post op diet with pt. Emphasized that liquids must be non carbonated, non caffeinated, and sugar free. Fluid goals discussed. Pt to follow up with outpatient bariatric RD for further diet progression after 2 weeks. Multivitamins and minerals also reviewed. Teach back method used, pt expressed understanding, expect good compliance.   Diet: First 2 Weeks  You will see the nutritionist about two (2) weeks after your surgery. The nutritionist will increase the types of foods you can eat if you are handling liquids well:  If you have severe vomiting or nausea and cannot handle clear liquids lasting longer than 1 day, call your surgeon  Protein Shake  Drink at least 2 ounces of shake 5-6 times per day  Each serving of protein shakes (usually 8 - 12 ounces) should have a minimum of:  15 grams of protein  And no more than 5 grams of carbohydrate  Goal for protein each day:  Men = 80 grams per day  Women = 60 grams per day  Protein powder may be added to fluids such as non-fat milk or Lactaid milk or Soy milk (limit to 35 grams added protein powder per serving)   Hydration  Slowly increase the amount of water and other clear liquids as tolerated (See Acceptable Fluids)  Slowly increase the amount of protein shake as tolerated  Sip fluids slowly and throughout the day  May use sugar substitutes in small amounts (no more than 6 - 8 packets per day; i.e. Splenda)   Fluid Goal  The first goal is to drink at least 8 ounces of protein shake/drink per day (or as directed  by the nutritionist); some examples of protein shakes are Premier Protein, [employer], [employer], EAS Edge HP, and [place]jury. See handout from pre-op Bariatric Education Class:  Slowly increase the amount of protein shake you drink as tolerated  You may find it easier to slowly sip shakes throughout the day  It is important to get your proteins in first  Your fluid goal is to drink 64 - 100 ounces of fluid daily  It may take a few weeks to build up to this  32 oz (or more) should be clear liquids  And  32 oz (or more) should be full liquids (see below for examples)  Liquids should not contain sugar, caffeine, or carbonation   Clear Liquids:  Water or Sugar-free flavored water (i.e. Fruit H2O, Propel)  Decaffeinated coffee or tea (sugar-free)  Crystal Lite, Wyler's Lite, Minute Maid Lite  Sugar-free Jell-O  Bouillon or broth  Sugar-free Popsicle: *Less than 20 calories each; Limit 1 per day   Full Liquids:  Protein Shakes/Drinks + 2 choices per day of other full liquids  Full liquids must be:  No More Than 12 grams of Carbs per serving  No More Than 3 grams of Fat per serving  Strained low-fat cream soup  Non-Fat milk  Fat-free Lactaid Milk  Sugar-free yogurt (Dannon Lite & Fit, [place] yogurt, Oikos Zero)   Clayton Bibles, MS, RD, LDN Pager: [phone] After Hours Pager: [phone]

## 2017-03-03 NOTE — Discharge Instructions (Signed)
How and Where to Give Subcutaneous Enoxaparin Injections Enoxaparin is an injectable medicine. It is used to help prevent blood clots from developing in your veins. Health care providers often use anticoagulants like enoxaparin to prevent clots following surgery. Enoxaparin is also used in combination with other medicines to treat blood clots and heart attacks. If blood clots are left untreated, they can be life threatening. Enoxaparin comes in single-use syringes. You inject enoxaparin through a syringe into your belly (abdomen). You should change the injection site each time you give yourself a shot. Continue the enoxaparin injections as directed by your health care provider. Your health care provider will use blood clotting test results to decide when you can safely stop using enoxaparin injections. If your health care provider prescribes any additional medicines, use the medicines exactly as directed. How do I inject enoxaparin? 1. Wash your hands with soap and water. 2. Clean the selected injection site as directed by your health care provider. 3. Remove the needle cap by pulling it straight off the syringe. 4. When using a prefilled syringe, do not push the air bubble out of the syringe before the injection. The air bubble will help you get all of the medicine out of the syringe. 5. Hold the syringe like a pencil using your writing hand. 6. Use your other hand to pinch and hold an inch of the cleansed skin. 7. Insert the entire needle straight down into the fold of skin. 8. Push the plunger with your thumb until the syringe is empty. 9. Pull the needle straight out of your skin. 10. Enoxaparin injection prefilled syringes and graduated prefilled syringes are available with a system that shields the needle after injection. After you have completed your injection and removed the needle from your skin, firmly push down on the plunger. The protective sleeve will automatically cover the needle and you  will hear a click. The click means the needle is safely covered. 11. Place the syringe in the nearest needle box, also called a sharps container. If you do not have a sharps container, you can use a hard-sided plastic container with a secure lid, such as an empty laundry detergent bottle. What else do I need to know?  Do not use enoxaparin if: ? You have allergies to heparin or pork products. ? You have been diagnosed with a condition called thrombocytopenia.  Do not use the syringe or needle more than one time.  Use medicines only as directed by your health care provider.  Changes in medicines, supplements, diet, and illness can affect your anticoagulation therapy. Be sure to inform your health care provider of any of these changes.  It is important that you tell all of your health care providers and your dentist that you are taking an anticoagulant, especially if you are injured or plan to have any type of procedure.  While on anticoagulants, you will need to have blood tests done routinely as directed by your health care provider.  While using this medicine, avoid physical activities or sports that could result in a fall or cause injury.  Follow up with your laboratory test and health care provider appointments as directed. It is very important to keep your appointments. Not keeping appointments could result in a chronic or permanent injury, pain, or disability.  Before giving your medicine, you should make sure the injection is a clear and colorless or pale yellow solution. If your medicine becomes discolored or if there are particles in the syringe, do not use  it and notify your health care provider.  Keep your medicine safely stored at room temperature. Contact a health care provider if:  You develop any rashes on your skin.  You have large areas of bruising on your skin.  You have any worsening of the condition for which you take Enoxaparin.  You develop a fever. Get help  right away if:  You develop bleeding problems such as: ? Bleeding from the gums or nose that does not stop quickly. ? Vomiting blood or coughing up blood. ? Blood in your urine. ? Blood in your stool, or stool that has a dark, tarry, or coffee grounds appearance. ? A cut that does not stop bleeding within 10 minutes. These symptoms may represent a serious problem that is an emergency. Do not wait to see if the symptoms will go away. Get medical help right away. Call your local emergency services (911 in the [place] Do not drive yourself to the hospital. This information is not intended to replace advice given to you by your health care provider. Make sure you discuss any questions you have with your health care provider. Document Released: 04/02/2004 Document Revised: 02/06/2016 Document Reviewed: 11/16/2013 Elsevier Interactive Patient Education  2017 [place]. Enoxaparin injection What is this medicine? ENOXAPARIN (ee nox a PA rin) is used after knee, hip, or abdominal surgeries to prevent blood clotting. It is also used to treat existing blood clots in the lungs or in the veins. This medicine may be used for other purposes; ask your health care provider or pharmacist if you have questions. COMMON BRAND NAME(S): Lovenox What should I tell my health care provider before I take this medicine? They need to know if you have any of these conditions: -bleeding disorders, hemorrhage, or hemophilia -infection of the heart or heart valves -kidney or liver disease -previous stroke -prosthetic heart valve -recent surgery or delivery of a baby -ulcer in the stomach or intestine, diverticulitis, or other bowel disease -an unusual or allergic reaction to enoxaparin, heparin, pork or pork products, other medicines, foods, dyes, or preservatives -pregnant or trying to get pregnant -breast-feeding How should I use this medicine? This medicine is for injection under the skin. It is usually given by  a health-care professional. You or a family member may be trained on how to give the injections. If you are to give yourself injections, make sure you understand how to use the syringe, measure the dose if necessary, and give the injection. To avoid bruising, do not rub the site where this medicine has been injected. Do not take your medicine more often than directed. Do not stop taking except on the advice of your doctor or health care professional. Make sure you receive a puncture-resistant container to dispose of the needles and syringes once you have finished with them. Do not reuse these items. Return the container to your doctor or health care professional for proper disposal. Talk to your pediatrician regarding the use of this medicine in children. Special care may be needed. Overdosage: If you think you have taken too much of this medicine contact a poison control center or emergency room at once. NOTE: This medicine is only for you. Do not share this medicine with others. What if I miss a dose? If you miss a dose, take it as soon as you can. If it is almost time for your next dose, take only that dose. Do not take double or extra doses. What may interact with this medicine? -aspirin and aspirin-like  medicines -certain medicines that treat or prevent blood clots -dipyridamole -NSAIDs, medicines for pain and inflammation, like ibuprofen or naproxen This list may not describe all possible interactions. Give your health care provider a list of all the medicines, herbs, non-prescription drugs, or dietary supplements you use. Also tell them if you smoke, drink alcohol, or use illegal drugs. Some items may interact with your medicine. What should I watch for while using this medicine? Visit your doctor or health care professional for regular checks on your progress. Your condition will be monitored carefully while you are receiving this medicine. Notify your doctor or health care professional and  seek emergency treatment if you develop breathing problems; changes in vision; chest pain; severe, sudden headache; pain, swelling, warmth in the leg; trouble speaking; sudden numbness or weakness of the face, arm, or leg. These can be signs that your condition has gotten worse. If you are going to have surgery, tell your doctor or health care professional that you are taking this medicine. Do not stop taking this medicine without first talking to your doctor. Be sure to refill your prescription before you run out of medicine. Avoid sports and activities that might cause injury while you are using this medicine. Severe falls or injuries can cause unseen bleeding. Be careful when using sharp tools or knives. Consider using an Copy. Take special care brushing or flossing your teeth. Report any injuries, bruising, or red spots on the skin to your doctor or health care professional. What side effects may I notice from receiving this medicine? Side effects that you should report to your doctor or health care professional as soon as possible: -allergic reactions like skin rash, itching or hives, swelling of the face, lips, or tongue -feeling faint or lightheaded, falls -signs and symptoms of bleeding such as bloody or black, tarry stools; red or dark-brown urine; spitting up blood or brown material that looks like coffee grounds; red spots on the skin; unusual bruising or bleeding from the eye, gums, or nose Side effects that usually do not require medical attention (report to your doctor or health care professional if they continue or are bothersome): -pain, redness, or irritation at site where injected This list may not describe all possible side effects. Call your doctor for medical advice about side effects. You may report side effects to FDA at 1-800-FDA-1088. Where should I keep my medicine? Keep out of the reach of children. Store at room temperature between 15 and 30 degrees C (59 and 86  degrees F). Do not freeze. If your injections have been specially prepared, you may need to store them in the refrigerator. Ask your pharmacist. Throw away any unused medicine after the expiration date. NOTE: This sheet is a summary. It may not cover all possible information. If you have questions about this medicine, talk to your doctor, pharmacist, or health care provider.  2018 Elsevier/Gold Standard (2013-10-03 16:06:21)     GASTRIC BYPASS/SLEEVE  Home Care Instructions   These instructions are to help you care for yourself when you go home.  Call: If you have any problems.  Call [phone] and ask for the surgeon on call  If you need immediate assistance come to the ER at [facility]. Tell the ER staff you are a new post-op gastric bypass or gastric sleeve patient  Signs and symptoms to report:  Severe  vomiting or nausea o If you cannot handle clear liquids for longer than 1 day, call your surgeon  Abdominal pain which  does not get better after taking your pain medication  Fever greater than 100.4  F and chills  Heart rate over 100 beats a minute  Trouble breathing  Chest pain  Redness,  swelling, drainage, or foul odor at incision (surgical) sites  If your incisions open or pull apart  Swelling or pain in calf (lower leg)  Diarrhea (Loose bowel movements that happen often), frequent watery, uncontrolled bowel movements  Constipation, (no bowel movements for 3 days) if this happens: o Take Milk of Magnesia, 2 tablespoons by mouth, 3 times a day for 2 days if needed o Stop taking Milk of Magnesia once you have had a bowel movement o Call your doctor if constipation continues Or o Take Miralax  (instead of Milk of Magnesia) following the label instructions o Stop taking Miralax once you have had a bowel movement o Call your doctor if constipation continues  Anything you think is abnormal for you   Normal side effects after surgery:  Unable to sleep at  night or unable to concentrate  Irritability  Being tearful (crying) or depressed  These are common complaints, possibly related to your anesthesia, stress of surgery, and change in lifestyle, that usually go away a few weeks after surgery. If these feelings continue, call your medical doctor.  Wound Care: You may have surgical glue, steri-strips, or staples over your incisions after surgery  Surgical glue: Looks like clear film over your incisions and will wear off a little at a time  Steri-strips: Adhesive strips of tape over your incisions. You may notice a yellowish color on skin under the steri-strips. This is used to make the steri-strips stick better. Do not pull the steri-strips off - let them fall off  Staples: Staples may be removed before you leave the hospital o If you go home with staples, call [facility] for an appointment with your surgeons nurse to have staples removed 10 days after surgery, [phone]  Showering: You may shower two (2) days after your surgery unless your surgeon tells you differently o Wash gently around incisions with warm soapy water, rinse well, and gently pat dry o If you have a drain (tube from your incision), you may need someone to hold this while you shower o No tub baths until staples are removed and incisions are healed   Medications:  Medications should be liquid or crushed if larger than the size of a dime  Extended release pills (medication that releases a little bit at a time through the  day) should not be crushed  Depending on the size and number of medications you take, you may need to space (take a few throughout the day)/change the time you take your medications so that you do not over-fill your pouch (smaller stomach)  Make sure you follow-up with you primary care physician to make medication changes needed during rapid weight loss and life -style changes  If you have diabetes, follow up with your doctor that  orders your diabetes medication(s) within one week after surgery and check your blood sugar regularly   Do not drive while taking narcotics (pain medications)   Do not take acetaminophen (Tylenol) and Roxicet or Lortab Elixir at the same time since these pain medications contain acetaminophen   Diet:  First 2 Weeks You will see the nutritionist about two (2) weeks after your surgery. The nutritionist will increase the types of foods you can eat if you are handling liquids well:  If you have severe  vomiting or nausea and cannot handle clear liquids lasting longer than 1 day call your surgeon Protein Shake  Drink at least 2 ounces of shake 5-6 times per day  Each serving of protein shakes (usually 8-12 ounces) should have a minimum of: o 15 grams of protein o And no more than 5 grams of carbohydrate  Goal for protein each day: o Men = 80 grams per day o Women = 60 grams per day     Protein powder may be added to fluids such as non-fat milk or Lactaid milk or Soy milk (limit to 35 grams added protein powder per serving)  Hydration  Slowly increase the amount of water and other clear liquids as tolerated (See Acceptable Fluids)  Slowly increase the amount of protein shake as tolerated  Sip fluids slowly and throughout the day  May use sugar substitutes in small amounts (no more than 6-8 packets per day; i.e. Splenda)  Fluid Goal  The first goal is to drink at least 8 ounces of protein shake/drink per day (or as directed by the nutritionist); some examples of protein shakes are [employer], [employer], EAS Edge HP, and Unjury. - See handout from pre-op Bariatric Education Class: o Slowly increase the amount of protein shake you drink as tolerated o You may find it easier to slowly sip shakes throughout the day o It is important to get your proteins in first  Your fluid goal is to drink 64-100 ounces of fluid daily o It may take a few weeks to build up to this   32  oz. (or more) should be clear liquids And  32 oz. (or more) should be full liquids (see below for examples)  Liquids should not contain sugar, caffeine, or carbonation  Clear Liquids:  Water of Sugar-free flavored water (i.e. Fruit HO, Propel)  Decaffeinated coffee or tea (sugar-free)  Crystal lite, [place]ers Lite, Minute Maid Lite  Sugar-free Jell-O  Bouillon or broth  Sugar-free Popsicle:    - Less than 20 calories each; Limit 1 per day  Full Liquids:                   Protein Shakes/Drinks + 2 choices per day of other full liquids  Full liquids must be: o No More Than 12 grams of Carbs per serving o No More Than 3 grams of Fat per serving  Strained low-fat cream soup  Non-Fat milk  Fat-free Lactaid Milk  Sugar-free yogurt (Dannon Lite & Fit, Greek yogurt)    Vitamins and Minerals  Start 1 day after surgery unless otherwise directed by your surgeon  2 Chewable Bariatric Multivitamin / Multimineral Supplement with iron   Chewable Calcium Citrate with Vitamin D-3 (Example: 3 Chewable Calcium  Plus 600 with Vitamin D-3) o Take 500 mg three (3) times a day for a total of 1500 mg each day o Do not take all 3 doses of calcium at one time as it may cause constipation, and you can only absorb 500 mg at a time o Do not mix multivitamins containing iron with calcium supplements;  take 2 hours apart  Menstruating women and those at risk for anemia ( a blood disease that causes weakness) may need extra iron o Talk to your doctor to see if you need more iron  If you need extra iron: Total daily Iron recommendation (including Vitamins) is 50 to 100 mg Iron/day  Do not stop taking or change any vitamins or minerals until you talk to  your nutritionist or surgeon  Your nutritionist and/or surgeon must approve all vitamin and mineral supplements   Activity and Exercise: It is important to continue walking at home. Limit your physical activity as instructed by your doctor.  During this time, use these guidelines:  Do not lift anything greater than ten  (10) pounds for at least two (2) weeks  Do not go back to work or drive until Engineer, production says you can  You may have sex when you feel comfortable o It is VERY important for female patients to use a reliable birth control method; fertility often increase after surgery o Do not get pregnant for at least 18 months  Start exercising as soon as your doctor tells you that you can o Make sure your doctor approves any physical activity  Start with a simple walking program  Walk 5-15 minutes each day, 7 days per week  Slowly increase until you are walking 30-45 minutes per day  Consider joining our [place] program. [phone] or email [email or web address]   Special Instructions Things to remember:  Use your CPAP when sleeping if this applies to you  Consider buying a medical alert bracelet that says you had lap-band surgery     You will likely have your first fill (fluid added to your band) 6 - 8 weeks after surgery  [facility] has a free Bariatric Surgery Support Group that meets monthly, the 3rd Thursday, [place]. You can see classes online at [email or web address]  It is very important to keep all follow up appointments with your surgeon, nutritionist, primary care physician, and behavioral health practitioner o After the first year, please follow up with your bariatric surgeon and nutritionist at least once a year in order to maintain best weight loss results                    [facility]:  [place]: [phone]               Bariatric Nurse Coordinator: [phone]  Gastric Bypass/Sleeve Home Care Instructions  Rev. 07/2012                                                         Reviewed and Endorsed                                                    by [facility]  Patient Education Committee, Jan, 2014

## 2017-03-03 NOTE — Progress Notes (Signed)
Patient ID: Teresa Aguilar, female   DOB: 12-15-1960, 56 y.o.   MRN: [id number] [facility] Progress Note:   1 Day Post-Op  Subjective: Mental status is clear Objective: Vital signs in last 24 hours: Temp:  [97.3 F (36.3 C)-98.8 F (37.1 C)] 97.9 F (36.6 C) (09/19 0404) Pulse Rate:  [65-84] 74 (09/19 0404) Resp:  [15-24] 18 (09/19 0404) BP: (132-162)/(70-92) 141/87 (09/19 0404) SpO2:  [95 %-100 %] 95 % (09/19 0404) Weight:  [114.3 kg (252 lb)] 114.3 kg (252 lb) (09/19 0557)  Intake/Output from previous day: 09/18 0701 - 09/19 0700 In: 2290 [P.O.:420; I.V.:1870] Out: 1960 [WNIOE:7035; Blood:10] Intake/Output this shift: Total I/O In: 34 [P.O.:60] Out: -   Physical Exam: Work of breathing is not labored; minimal complaints  Lab Results:  Results for orders placed or performed during the hospital encounter of 03/02/17 (from the past 48 hour(s))  Glucose, capillary     Status: Abnormal   Collection Time: 03/02/17  5:19 AM  Result Value Ref Range   Glucose-Capillary 139 (H) 65 - 99 mg/dL  Glucose, capillary     Status: Abnormal   Collection Time: 03/02/17  1:26 PM  Result Value Ref Range   Glucose-Capillary 178 (H) 65 - 99 mg/dL  CBC     Status: Abnormal   Collection Time: 03/02/17  2:37 PM  Result Value Ref Range   WBC 7.2 4.0 - 10.5 K/uL   RBC 4.59 3.87 - 5.11 MIL/uL   Hemoglobin 14.4 12.0 - 15.0 g/dL   HCT 41.9 36.0 - 46.0 %   MCV 91.3 78.0 - 100.0 fL   MCH 31.4 26.0 - 34.0 pg   MCHC 34.4 30.0 - 36.0 g/dL   RDW 14.0 11.5 - 15.5 %   Platelets 102 (L) 150 - 400 K/uL    Comment: SPECIMEN CHECKED FOR CLOTS REPEATED TO VERIFY PLATELET COUNT CONFIRMED BY SMEAR   Creatinine, serum     Status: None   Collection Time: 03/02/17  2:37 PM  Result Value Ref Range   Creatinine, Ser 0.86 0.44 - 1.00 mg/dL   GFR calc non Af Amer >60 >60 mL/min   GFR calc Af Amer >60 >60 mL/min    Comment: (NOTE) The eGFR has been calculated using the CKD EPI equation. This  calculation has not been validated in all clinical situations. eGFR's persistently <60 mL/min signify possible Chronic Kidney Disease.   CBC WITH DIFFERENTIAL     Status: Abnormal   Collection Time: 03/03/17  5:17 AM  Result Value Ref Range   WBC 6.7 4.0 - 10.5 K/uL   RBC 4.18 3.87 - 5.11 MIL/uL   Hemoglobin 13.0 12.0 - 15.0 g/dL   HCT 38.7 36.0 - 46.0 %   MCV 92.6 78.0 - 100.0 fL   MCH 31.1 26.0 - 34.0 pg   MCHC 33.6 30.0 - 36.0 g/dL   RDW 14.0 11.5 - 15.5 %   Platelets 102 (L) 150 - 400 K/uL    Comment: CONSISTENT WITH PREVIOUS RESULT   Neutrophils Relative % 83 %   Neutro Abs 5.6 1.7 - 7.7 K/uL   Lymphocytes Relative 10 %   Lymphs Abs 0.6 (L) 0.7 - 4.0 K/uL   Monocytes Relative 8 %   Monocytes Absolute 0.5 0.1 - 1.0 K/uL   Eosinophils Relative 0 %   Eosinophils Absolute 0.0 0.0 - 0.7 K/uL   Basophils Relative 0 %   Basophils Absolute 0.0 0.0 - 0.1 K/uL    Radiology/Results: No results  found.  Anti-infectives: Anti-infectives    Start     Dose/Rate Route Frequency Ordered Stop   03/02/17 0654  cefoTEtan in Dextrose 5% (CEFOTAN) 2-2.08 GM-% IVPB    Comments:  [place]   : cabinet override      03/02/17 0654 03/02/17 0735   03/02/17 0521  cefoTEtan in Dextrose 5% (CEFOTAN) IVPB 2 g     2 g Intravenous On call to O.R. 03/02/17 0521 03/02/17 0735      Assessment/Plan: Problem List: Patient Active Problem List   Diagnosis Date Noted  . S/P laparoscopic sleeve gastrectomy Sept 2018 03/02/2017  . Malignant neoplasm of lower-inner quadrant of right breast of female, estrogen receptor positive ([place]) 09/29/2016  . Thrombocytopenia ([place]) 09/29/2016  . Heartburn 11/13/2014  . Chemotherapy-induced nausea 10/15/2014  . Genetic testing 10/11/2014  . Nausea with vomiting 10/01/2014  . Anorexia 10/01/2014  . Bone pain 10/01/2014  . Rash 10/01/2014  . Family history of breast cancer   . Family history of colon cancer   . BMI 45.0-49.9, adult ([place]) 09/04/2014  . Acute  deep vein thrombosis (DVT) of other specified vein of right lower extremity ([place]) 09/04/2014  . Migraines 09/04/2014  . High grade dysplasia in colonic adenoma 09/04/2014  . Carcinoma of lower-inner quadrant of right breast ([place]) 08/03/2014  . Instability of sacroiliac joint 02/22/2014    Taking liquids.  DVT scan today.  Lovenox bridge will be planned and hopeful discharge later today.   1 Day Post-Op    LOS: 1 day   Matt B. Hassell Done, MD, [facility] [phone] beeper [phone]  03/03/2017 8:30 AM

## 2017-03-06 ENCOUNTER — Telehealth: Payer: Self-pay | Admitting: Surgery

## 2017-03-06 NOTE — Telephone Encounter (Signed)
Teresa Aguilar had a sleeve gastrectomy by Dr. Hassell Done on 03/02/2017.  Over the last 24 hours, she's had pain in her left neck and shoulder when she swallows.  She's had no fever, she is keeping liquids down, she does have a raspy voice from surgery.  She is not febrile and otherwise feels okay.  She is going to see how she does over the next 24 -48 hours and call us back if no better.  Alphonsa Overall, MD, [facility] Surgery Pager: [phone] Office phone:  [phone]

## 2017-03-08 ENCOUNTER — Telehealth (HOSPITAL_COMMUNITY): Payer: Self-pay

## 2017-03-08 NOTE — Telephone Encounter (Addendum)
Called patient to discuss below questions post bariatric surgery.  Message left with contact information provided.  Will follow up if no return call.   Made discharge phone call to patient. Asking the following questions.    1. Do you have someone to care for you now that you are home?  independent 2. Are you having pain now that is not relieved by your pain medication?  Not taking pain medication  3. Are you able to drink the recommended daily amount of fluids (48 ounces minimum/day) and protein (60-80 grams/day) as prescribed by the dietitian or nutritional counselor?  45 grams of protein and around 40 ounces of total fluid,  Encouraged to increase fluid, discussed s/s of dehydration 4. Are you taking the vitamins and minerals as prescribed?  yes 5. Do you have the "on call" number to contact your surgeon if you have a problem or question?  yes 6. Are your incisions free of redness, swelling or drainage? (If steri strips, address that these can fall off, shower as tolerated) yes 7. Have your bowels moved since your surgery?  If not, are you passing gas?  Yes after MOM 8. Are you up and walking 3-4 times per day?  yes 9. Were you provided your discharge medications before your surgery or before you were discharged from the hospital and are you taking them without problem?  yes

## 2017-03-16 ENCOUNTER — Encounter: Payer: BC Managed Care – PPO | Attending: Surgery | Admitting: Skilled Nursing Facility1

## 2017-03-16 DIAGNOSIS — Z713 Dietary counseling and surveillance: Secondary | ICD-10-CM | POA: Diagnosis not present

## 2017-03-16 DIAGNOSIS — E669 Obesity, unspecified: Secondary | ICD-10-CM

## 2017-03-17 ENCOUNTER — Encounter: Payer: Self-pay | Admitting: Skilled Nursing Facility1

## 2017-03-17 NOTE — Progress Notes (Signed)
Bariatric Class:  Appt start time: 1530 end time:  1630.  2 Week Post-Operative Nutrition Class  Patient was seen on 03/17/2017 for Post-Operative Nutrition education at [facility].   Surgery date: 03/02/2017 Surgery type: Sleeve Gastrectomy  Start weight at [facility]: 266.2 Weight today: 257.1  TANITA  BODY COMP RESULTS  03/17/2017   BMI (kg/m^2) 41.1   Fat Mass (lbs) 124.8   Fat Free Mass (lbs) 118.4   Total Body Water (lbs) 86.4   The following the learning objectives were met by the patient during this course:  Identifies Phase 3A (Soft, High Proteins) Dietary Goals and will begin from 2 weeks post-operatively to 2 months post-operatively  Identifies appropriate sources of fluids and proteins   States protein recommendations and appropriate sources post-operatively  Identifies the need for appropriate texture modifications, mastication, and bite sizes when consuming solids  Identifies appropriate multivitamin and calcium sources post-operatively  Describes the need for physical activity post-operatively and will follow MD recommendations  States when to call healthcare provider regarding medication questions or post-operative complications  Handouts given during class include:  Phase 3A: Soft, High Protein Diet Handout  Follow-Up Plan: Patient will follow-up at [facility] in 6 weeks for 2 month post-op nutrition visit for diet advancement per MD.

## 2017-04-27 ENCOUNTER — Encounter: Payer: Self-pay | Admitting: Registered"

## 2017-04-27 ENCOUNTER — Encounter: Payer: BC Managed Care – PPO | Attending: Surgery | Admitting: Registered"

## 2017-04-27 DIAGNOSIS — Z713 Dietary counseling and surveillance: Secondary | ICD-10-CM | POA: Insufficient documentation

## 2017-04-27 DIAGNOSIS — E119 Type 2 diabetes mellitus without complications: Secondary | ICD-10-CM

## 2017-04-27 NOTE — Patient Instructions (Signed)
Goals:  Follow Phase 3B: High Protein + Non-Starchy Vegetables  Eat 3-6 small meals/snacks, every 3-5 hrs  Increase lean protein foods to meet 60g goal  Increase fluid intake to 64oz +  Avoid drinking 15 minutes before, during and 30 minutes after eating  Aim for >30 min of physical activity daily  

## 2017-04-27 NOTE — Progress Notes (Signed)
Follow-up visit:  8 Weeks Post-Operative Sleeve gastrectomy Surgery  Medical Nutrition Therapy:  Appt start time: 2:00 end time:  2:30.  Primary concerns today: Post-operative Bariatric Surgery Nutrition Management.  Non scale victories: less migraines, coat fits bigger  Surgery date: 03/02/2017 Surgery type: Sleeve gastrectomy Start weight at [facility]: 266.2 lbs Weight today: 227.0 lbs (pt reported) Weight change: 30.1 lbs from 257.1 on 03/17/2017 Total weight lost: 39.2 lbs Weight loss goal: below 200 lbs   TANITA  BODY COMP RESULTS  03/17/2017 04/27/2017   BMI (kg/m^2) 41.1 N/A   Fat Mass (lbs) 124.8    Fat Free Mass (lbs) 118.4    Total Body Water (lbs) 86.4     Pt arrives with mom stating her son had PTSD and died due to overdose in 02-06-23. Pt states son used to live with her and she found him. Pt states she has a strong support system with family and pastor. Pt states things are going well with her cancer. Pt is doing well meeting her goals and doing what is best for her body.    Preferred Learning Style:   No preference indicated   Learning Readiness:   Ready  Change in progress  24-hr recall: B (AM): Premier Protein (30g) Snk (AM): none  L (PM): deli ham (7g), cheese stick (6g) Snk (PM): none  D (PM): 1/2 c beans (8g), 2 oz filet mignon (14g)  Snk (PM): none  Fluid intake: 1% milk (32 oz), water (24 oz), protein shake (11 oz); 67 oz Estimated total protein intake: 97g   Medications: See list; no longer taking metformin Supplementation: 2 Bariatric Advantage + 3 calcium  CBG monitoring: yes Average CBG per patient: 110-140 Last patient reported A1c: N/A  Using straws: no Drinking while eating: no Having you been chewing well: yes Chewing/swallowing difficulties: no Changes in vision: no Changes to mood/headaches: no, no Hair loss/Changes to skin/Changes to nails: some, no, no Any difficulty focusing or concentrating: no, issues with remembering due  cancer Sweating: no Dizziness/Lightheaded: sometimes Palpitations: no Carbonated beverages: no N/V/D/C/GAS: no, no, no, yes-taking stool softener, sometimes Abdominal Pain: no Dumping syndrome: no Last Lap-Band fill: N/A  Recent physical activity:  Water aerobics 45 min, 2x/week  Progress Towards Goal(s):  In progress.  Handouts given during visit include:  Phase 3B: High protein + NS vegetables   Nutritional Diagnosis:  NI-5.8.5 Inadeqate fiber intake As related to bariatric surgery post-op recommendations.  As evidenced by pt report of constipation and minimal fiber intake.    Intervention:  Nutrition education and counseling.  Goals:  Follow Phase 3B: High Protein + Non-Starchy Vegetables  Eat 3-6 small meals/snacks, every 3-5 hrs  Increase lean protein foods to meet 60g goal  Increase fluid intake to 64oz +  Avoid drinking 15 minutes before, during and 30 minutes after eating  Aim for >30 min of physical activity daily   Teaching Method Utilized:  Visual Auditory  Barriers to learning/adherence to lifestyle change: none  Demonstrated degree of understanding via:  Teach Back   Monitoring/Evaluation:  Dietary intake, exercise, lap band fills, and body weight. Follow up in 4 months for 6 month post-op visit.

## 2017-07-29 ENCOUNTER — Other Ambulatory Visit: Payer: Self-pay | Admitting: Oncology

## 2017-07-29 DIAGNOSIS — Z853 Personal history of malignant neoplasm of breast: Secondary | ICD-10-CM

## 2017-08-24 ENCOUNTER — Encounter: Payer: Self-pay | Admitting: Skilled Nursing Facility1

## 2017-08-24 ENCOUNTER — Encounter: Payer: BC Managed Care – PPO | Attending: Surgery | Admitting: Skilled Nursing Facility1

## 2017-08-24 DIAGNOSIS — Z713 Dietary counseling and surveillance: Secondary | ICD-10-CM | POA: Insufficient documentation

## 2017-08-24 DIAGNOSIS — E119 Type 2 diabetes mellitus without complications: Secondary | ICD-10-CM

## 2017-08-24 NOTE — Patient Instructions (Addendum)
-  Try the bariatric advantage capsule   -Talk with your doctor about magnesium   -Try the FiberOne cereal   -Have vegetables with every lunch and dinner   -Be as active as you can tolerate throughout the week: 10 minutes at a time  -Try the high fiber tortillas

## 2017-08-24 NOTE — Progress Notes (Signed)
Post-Operative Sleeve gastrectomy Surgery  Medical Nutrition Therapy:  Appt start time: 2:00 end time:  2:30.  Primary concerns today: Post-operative Bariatric Surgery Nutrition Management.  Non scale victories: less migraines, coat fits bigger  Surgery date: 03/02/2017 Surgery type: Sleeve gastrectomy Start weight at [facility]: 266.2 lbs Weight today: 208 lbs (pt reported) Weight loss goal: below 200 lbs   TANITA  BODY COMP RESULTS  03/17/2017   BMI (kg/m^2) 41.1   Fat Mass (lbs) 124.8   Fat Free Mass (lbs) 118.4   Total Body Water (lbs) 86.4   Pt arrived with her adorable grandson.  Pt states she is dizzy every morning but having protein shakes keeps that from happening. Pt states she feels dizzy if she is getting up from the floor. Pt states she struggles with not drinking with meals. Pt states she is no longer taking metformin. Pt states when she feels dizzy her sugars are about 120. Pt states she has dabbled in some cake. Pt states she wakes up nauseous or has it at night possibly linked to not eating. Pt states she cannot eat [place] anymore and is about tired of chicken. Pt states her mother has Alzheimers and will only eat at [facility]. Pt states she needs to drink a lot of milk for her cancer. Pt states she is not a vegetables person. Pt states she cannot eat bead because it hurts. Pt states smelling BBQ sauce makes her nauseous. Pt states she had a migraine for 2 days not being able to eat anything: first one in 3 months.    24-hr recall: B (7-8AM): Premier Protein (30g) Snk (10AM): egg and toast or cheerios or soda crackers if nauseous  L (PM): deli ham (7g), cheese stick (6g) or grilled chicken or 1% milk with chicken nuggets and 3 french fries Snk (PM): none  D (PM): 1/2 c beans (8g), 2 oz filet mignon (14g) and potatoes  Snk (PM): none  Fluid intake: 1% milk (32 oz), water (Average-45-60 oz), protein shake (11 oz); 67 oz Estimated total protein intake: 97g   Medications:  See list; no longer taking metformin Supplementation: 2 Bariatric Advantage + 3 calcium, magnesium   CBG monitoring: yes Average CBG per patient: 130-140 Last patient reported A1c: N/A  Using straws: no Drinking while eating: no Having you been chewing well: yes Chewing/swallowing difficulties: no Changes in vision: no Changes to mood/headaches: no, no Hair loss/Changes to skin/Changes to nails: some, no, no Any difficulty focusing or concentrating: no, issues with remembering due cancer Sweating: no Dizziness/Lightheaded: sometimes Palpitations: no Carbonated beverages: no N/V/D/C/GAS: no Abdominal Pain: no Dumping syndrome: no  Recent physical activity: ADL's due tot back pain  Progress Towards Goal(s):  In progress.  Handouts given during visit include:  Phase 3B: High protein + NS vegetables   Nutritional Diagnosis:  NI-5.8.5 Inadeqate fiber intake As related to bariatric surgery post-op recommendations.  As evidenced by pt report of constipation and minimal fiber intake.    Intervention:  Nutrition education and counseling.  Goals: -Try the bariatric advantage capsule  -Talk with your doctor about magnesium  -Try the FiberOne cereal  -Have vegetables with every lunch and dinner  -Be as active as you can tolerate throughout the week: 10 minutes at a time -Try the high fiber tortillas   Teaching Method Utilized:  Visual Auditory  Barriers to learning/adherence to lifestyle change: none  Demonstrated degree of understanding via:  Teach Back   Monitoring/Evaluation:  Dietary intake, exercise, and body weight. Follow  up in 4 months for 6 month post-op visit.

## 2017-08-29 ENCOUNTER — Other Ambulatory Visit: Payer: Self-pay | Admitting: Oncology

## 2017-08-31 ENCOUNTER — Telehealth: Payer: Self-pay | Admitting: Oncology

## 2017-08-31 NOTE — Telephone Encounter (Signed)
Called patient regarding 6/24

## 2017-09-03 ENCOUNTER — Ambulatory Visit
Admission: RE | Admit: 2017-09-03 | Discharge: 2017-09-03 | Disposition: A | Discharge status: Home / Self Care | Payer: BC Managed Care – PPO | Source: Ambulatory Visit | Attending: Oncology | Admitting: Oncology

## 2017-09-03 DIAGNOSIS — C50311 Malignant neoplasm of lower-inner quadrant of right female breast: Secondary | ICD-10-CM

## 2017-09-03 DIAGNOSIS — Z170 Estrogen receptor positive status [ER+]: Principal | ICD-10-CM

## 2017-09-03 DIAGNOSIS — Z853 Personal history of malignant neoplasm of breast: Secondary | ICD-10-CM

## 2017-09-03 DIAGNOSIS — M8580 Other specified disorders of bone density and structure, unspecified site: Secondary | ICD-10-CM

## 2017-09-03 HISTORY — DX: Personal history of antineoplastic chemotherapy: Z92.21

## 2017-09-29 ENCOUNTER — Other Ambulatory Visit: Payer: Self-pay | Admitting: Orthopedic Surgery

## 2017-09-29 DIAGNOSIS — M545 Low back pain, unspecified: Secondary | ICD-10-CM

## 2017-09-30 ENCOUNTER — Other Ambulatory Visit: Payer: BC Managed Care – PPO

## 2017-09-30 ENCOUNTER — Ambulatory Visit: Payer: BC Managed Care – PPO | Admitting: Oncology

## 2017-10-09 ENCOUNTER — Other Ambulatory Visit: Payer: BC Managed Care – PPO

## 2017-12-05 NOTE — Progress Notes (Signed)
[place]  Telephone:(336) [phone] Fax:(336) [phone]     ID: Teresa Aguilar DOB: 06/25/1960  MR#: [id number]  IHW#:[id number]  Patient Care Team: [name], MD as PCP - General (Family Medicine) [name], MD as Consulting Physician (General Surgery) Jaxson Anglin, [name], MD as Consulting Physician (Oncology) [name], MD as Consulting Physician (Radiation Oncology) [name], NP as Nurse Practitioner (Hematology and Oncology) PCP: [name], MD GYN: [name] MD SU: [name] MD OTHER MD: [name] MD, Phylliss Bob MD, Jamie Kato MD  CHIEF COMPLAINT: Estrogen receptor positive breast cancer  CURRENT TREATMENT: anastrozole, rivaroxaban  BREAST CANCER HISTORY:   from the original intake note:  "Teresa Aguilar" had routine screening mammography January 2016 in Dr. Tonette Bihari office showing a possible change in the right breast. On 07/24/2014 at the breast Center she underwent right digital mammography and ultrasonography. The breast density was category A. In the lower inner quadrant of the right breast there was a 2 cm mass which was palpable by exam. Ultrasound confirmed a 1.5 cm irregular hypoechoic mass at the 3:30 o'clock position 12 cm from the nipple. There was no right axillary adenopathy.  Biopsy of the mass in question 07/24/2014 showed ([id number]) invasive ductal carcinoma, grade 2 or 3, estrogen receptor 98% positive, progesterone receptor 76% positive, both with strong staining intensity, with an MIB-1 of 66% and no HER-2 amplification by FISH.  The patient's case was discussed at the multidisciplinary breast cancer conference 08/08/2014 and it was felt the patient would benefit from breast conserving surgery and likely would need an Oncotype to decide on optimal systemic therapy. She would need radiation and hormones. The question of genetics counseling was also raised.  On 08/27/2014 the patient underwent  right lumpectomy and sentinel lymph node sampling. The final pathology ([id number]) confirmed invasive ductal carcinoma, grade 3, measuring 1.9 cm. There was evidence of lymphovascular and perineural invasion. Margins were negative but close, the closest margin for the in situ component being 1 mm. The single sentinel lymph node was negative. Repeat HER-2 was again negative, with a signals ratio of 0.97 and a number per cell of 1.60.  The patient's subsequent history is as detailed below  INTERVAL HISTORY: Teresa Aguilar returns today for follow-up of her estrogen receptor positive breast cancer accompanied by her sister. She continues on anastrozole, with good tolerance. She gets occasional nausea after taking this.  There is no vomiting however.  She denies issues with vaginal dryness. She has occasional soreness and pain in the right breast from her remote surgery.   She also continues on rivaroxaban, with good tolerance. She denies issues with bleeding. She pays about $30 a month for this medication.   Since her last visit, she underwent diagnostic bilateral mammography with CAD and tomography on 09/03/2017 at [facility] showing: breast density category B. There was no evidence of malignancy.   She also completed a bone density on 09/03/2017 which showed a T score of -2.3/ osteopenia  REVIEW OF SYSTEMS: Teresa Aguilar reports that she is tired very often. She doesn't sleep very well and she usually falls asleep around 4 am. If she sleeps, she is still tired. She has back pain and gets injections under Dr. Jacelyn Grip, but this hasn't helped. For exercise, she walks down her driveway, but she get tired very easily.  She denies unusual headaches, visual changes, nausea, vomiting, or dizziness. There has been no unusual cough, phlegm production, or pleurisy. This been no  change in bowel or bladder habits. She denies unexplained fatigue or unexplained weight loss, bleeding, rash, or fever. A detailed review of  systems was otherwise stable.    PAST MEDICAL HISTORY: Past Medical History:  Diagnosis Date  . Anxiety    relative for to pain & frustration of prev. hospitalization   . Arthritis    degenerative lumbar spine    . Breast cancer ([facility]) 2016  . Breast cancer of lower-inner quadrant of right female breast ([facility])   . Chronic back pain   . Colon cancer ([place])   . DVT (deep venous thrombosis) ([facility]) 03/2014   R leg, post op- on Xarelto   . Family history of adverse reaction to anesthesia    PONV  . Family history of breast cancer   . Family history of colon cancer   . GERD (gastroesophageal reflux disease)    used during chemo, has not taken recently  . Gestational diabetes 1986; 1996  . H/O cardiovascular stress test 2012    test done for stress from her Father dying, had chest pain ,pt. had a stress/echo  test, told it was wnl  . Hypertension    pt. reports that she has been higher in the past & again now but doesn't take any med.,never has for ^BP, pt. stating that BP is high now b/c of her pain in her back.    . Hypoxia    after surgery  . Migraines    "at least q other week" (08/27/2014)  . Personal history of chemotherapy    2016  . Personal history of radiation therapy    2016  . PONV (postoperative nausea and vomiting)    low O2 sats, < 50% post op  . Radiation 01/17/15-03/06/15   right breast 50.4 gray, lumpectomy cavity boost to 12 gray    PAST SURGICAL HISTORY: Past Surgical History:  Procedure Laterality Date  . AXILLARY SENTINEL NODE BIOPSY Right 08/27/2014   Archie Endo 08/27/2014  . BACK SURGERY    . BREAST BIOPSY Right 07/24/2014   core biopsy  . BREAST LUMPECTOMY Right 08/27/2014   Archie Endo 08/27/2014  . COLONOSCOPY W/ POLYPECTOMY  2015   found tubulovillous adenoma with focal high grade displasia  . DENTAL SURGERY  ~ 2011   "replaced bone graft upper jaw; put 2 implants in"  . ENDOMETRIAL ABLATION  ~ 1998  . EXCISIONAL HEMORRHOIDECTOMY  2015  . EYE MUSCLE SURGERY  Bilateral ~ 1965   to fix cross-eye as child  . LAPAROSCOPIC GASTRIC SLEEVE RESECTION N/A 03/02/2017   Procedure: LAPAROSCOPIC GASTRIC SLEEVE RESECTION WITH UPPER ENDOSCOPY;  Surgeon: [name], MD;  Location: [facility];  Service: General;  Laterality: N/A;  . PLANTAR FASCIA RELEASE Right ~ 2000  . PORT-A-CATH REMOVAL Right 12/31/2014   Procedure: REMOVAL PORT-A-CATH;  Surgeon: [name], MD;  Location: [facility];  Service: General;  Laterality: Right;  . PORTACATH PLACEMENT N/A 09/19/2014   Procedure: INSERTION PORT-A-CATH;  Surgeon: [name], MD;  Location: [facility];  Service: General;  Laterality: N/A;  . RADIOACTIVE SEED GUIDED PARTIAL MASTECTOMY WITH AXILLARY SENTINEL LYMPH NODE BIOPSY Right 08/27/2014   Procedure: RADIOACTIVE SEED GUIDED RIGHT BREAST LUMPECTOMY WITH RIGHT AXILLARY SENTINEL LYMPH NODE BIOPSY;  Surgeon: [name], MD;  Location: [facility];  Service: General;  Laterality: Right;  . RADIOFREQUENCY ABLATION NERVES     x 2 to nerves in her lower back  . SACROILIAC JOINT FUSION Right 02/22/2014   Procedure: SACROILIAC JOINT FUSION;  Surgeon: Elta Guadeloupe  Suzan Slick, MD;  Location: [facility];  Service: Orthopedics;  Laterality: Right;  Right sided sacroiliac joint fusion  . TONSILLECTOMY  1988  . TUBAL LIGATION  1996    FAMILY HISTORY Family History  Problem Relation Age of Onset  . Breast cancer Cousin 71       maternal first cousin  . Colon cancer Maternal Aunt 75  . Throat cancer Maternal Aunt 75       smoker  . Breast cancer Paternal Aunt 76  . Heart disease Mother   . Heart disease Father   . Diabetes type II Father   . Stomach cancer Maternal Uncle        dx in his 26s  . Colon cancer Cousin 69        maternal first cousin  . Prostate cancer Cousin 60       paternal first cousin  . Breast cancer Paternal Aunt        dx in her 35s  . Breast cancer Other        mother's paternal first cousin  . Colon cancer Other 85       MGF's sister  . Brain cancer  Other 79       MGFs sister  . Breast cancer Other        MGF's paternal aunt  . Prostate cancer Other        MGF's paternal first cousin  . Breast cancer Paternal Aunt   . Cancer Other   . Hyperlipidemia Other   . Diabetes Other    the patient's father died at the age of 22 from heart failure in the setting of diabetes. The patient's mother is currently 48 years old. The patient has 3 brothers, 2 sisters. There is no history of breast, ovarian, or colon cancer in first-degree relatives. However, on the mother's side the patient has 3 relatives with breast cancer (a cousin diagnosed age 70, a second cousin diagnosed age 60, and a great aunt). There is also a history of colon cancer diagnosed age around age 27 in a great aunt and small intestinal cancer in an aunt diagnosed age 40. On the father's side there are 2 cousins with breast cancer diagnosed age 56 and 40, and a cousin with colon cancer diagnosed age 36  GYNECOLOGIC HISTORY:  No LMP recorded. Patient has had an ablation. Menarche age 19, the patient carried 2 children to term, the first at age 37. After the first live birth she had a premature girl who died after one day (she had been a breech birth) and she also had 3 miscarriages. The patient underwent endometrial ablation in 1998 and has had no periods since that time.  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 Her husband Braulio Conte works for [employer] in the [employer] and Manufacturing systems engineer. Son Shyvonne Chastang lives in [place] and works in Education officer, community. He was an Gaffer) and son Sennie Borden is 29 and works for [employer]. He also lives in [place]. The patient has one granddaughter. The patient is a Psychologist, forensic    ADVANCED DIRECTIVES: Not in place   HEALTH MAINTENANCE: Social History   Tobacco Use  . Smoking status: Never Smoker  . Smokeless tobacco: Never Used  Substance  Use Topics  . Alcohol use: No  . Drug use: No     Colonoscopy: June 2015/May and last repeat planned 10/03/2014  PAP: 2015  Bone density: 09/03/2017 shows a T score of -1.4 osteopenia  Lipid panel:  Allergies  Allergen Reactions  . Morphine And Related Nausea And Vomiting  . Sulfa Antibiotics Hives  . Meloxicam Rash    Current Outpatient Medications  Medication Sig Dispense Refill  . anastrozole (ARIMIDEX) 1 MG tablet TAKE 1 TABLET BY MOUTH EVERY DAY 90 tablet 3  . eletriptan (RELPAX) 40 MG tablet Take 40 mg by mouth every 2 (two) hours as needed for migraine. Reported on 10/01/2015    . enoxaparin (LOVENOX) 30 MG/0.3ML injection Inject 0.3 mLs (30 mg total) into the skin every 12 (twelve) hours. 10 Syringe 0   No current facility-administered medications for this visit.     OBJECTIVE: Middle-aged white woman who appears older than stated age   48:   12/06/17 1358  BP: 120/69  Pulse: 70  Resp: 18  Temp: 98.4 F (36.9 C)  SpO2: 100%     Body mass index is 35.77 kg/m.    ECOG FS:1 - Symptomatic but completely ambulatory Filed Weights   12/06/17 1358  Weight: 213 lb 4.8 oz (96.8 kg)    Sclerae unicteric, pupils round and equal Oropharynx clear and moist No cervical or supraclavicular adenopathy Lungs no rales or rhonchi Heart regular rate and rhythm Abd soft, obese, nontender, positive bowel sounds MSK no focal spinal tenderness, no upper extremity lymphedema Neuro: nonfocal, well oriented, appropriate affect Breasts: The right breast is status post lumpectomy and radiation.  There is no evidence of local recurrence.  The left breast is benign.  Both axillae are benign.   LAB RESULTS:  CMP     Component Value Date/Time   NA 139 02/19/2017 0929   NA 143 09/21/2016 1131   K 3.9 02/19/2017 0929   K 4.0 09/21/2016 1131   CL 107 02/19/2017 0929   CO2 27 02/19/2017 0929   CO2 26 09/21/2016 1131   GLUCOSE 159 (H) 02/19/2017 0929   GLUCOSE 189 (H) 09/21/2016  1131   BUN 18 02/19/2017 0929   BUN 15.9 09/21/2016 1131   CREATININE 0.86 03/02/2017 1437   CREATININE 0.9 09/21/2016 1131   CALCIUM 9.3 02/19/2017 0929   CALCIUM 9.4 09/21/2016 1131   PROT 7.1 02/19/2017 0929   PROT 6.9 09/21/2016 1131   ALBUMIN 4.2 02/19/2017 0929   ALBUMIN 3.9 09/21/2016 1131   AST 30 02/19/2017 0929   AST 22 09/21/2016 1131   ALT 30 02/19/2017 0929   ALT 26 09/21/2016 1131   ALKPHOS 68 02/19/2017 0929   ALKPHOS 91 09/21/2016 1131   BILITOT 0.8 02/19/2017 0929   BILITOT 0.55 09/21/2016 1131   GFRNONAA >60 03/02/2017 1437   GFRAA >60 03/02/2017 1437    INo results found for: SPEP, UPEP  Lab Results  Component Value Date   WBC 4.0 12/06/2017   NEUTROABS 2.8 12/06/2017   HGB 13.6 12/06/2017   HCT 40.9 12/06/2017   MCV 94.2 12/06/2017   PLT 96 (L) 12/06/2017      Chemistry      Component Value Date/Time   NA 139 02/19/2017 0929   NA 143 09/21/2016 1131   K 3.9 02/19/2017 0929   K 4.0 09/21/2016 1131   CL 107 02/19/2017 0929   CO2 27 02/19/2017 0929   CO2 26 09/21/2016 1131   BUN 18 02/19/2017 0929   BUN 15.9 09/21/2016 1131   CREATININE 0.86 03/02/2017 1437   CREATININE 0.9 09/21/2016 1131      Component Value Date/Time   CALCIUM  9.3 02/19/2017 0929   CALCIUM 9.4 09/21/2016 1131   ALKPHOS 68 02/19/2017 0929   ALKPHOS 91 09/21/2016 1131   AST 30 02/19/2017 0929   AST 22 09/21/2016 1131   ALT 30 02/19/2017 0929   ALT 26 09/21/2016 1131   BILITOT 0.8 02/19/2017 0929   BILITOT 0.55 09/21/2016 1131       No results found for: LABCA2  No components found for: LABCA125  No results for input(s): INR in the last 168 hours.  Urinalysis    Component Value Date/Time   COLORURINE AMBER (A) 02/21/2014 1540   APPEARANCEUR CLOUDY (A) 02/21/2014 1540   LABSPEC 1.029 02/21/2014 1540   PHURINE 5.0 02/21/2014 1540   GLUCOSEU NEGATIVE 02/21/2014 1540   HGBUR SMALL (A) 02/21/2014 1540   BILIRUBINUR SMALL (A) 02/21/2014 1540   KETONESUR 15  (A) 02/21/2014 1540   PROTEINUR NEGATIVE 02/21/2014 1540   UROBILINOGEN 1.0 02/21/2014 1540   NITRITE POSITIVE (A) 02/21/2014 1540   LEUKOCYTESUR TRACE (A) 02/21/2014 1540    STUDIES: Since her last visit, she underwent diagnostic bilateral mammography with CAD and tomography on 09/03/2017 at [facility] showing: breast density category B. There was no evidence of malignancy.   She also completed a bone density on 09/03/2017 which showed a T score of -1.4 osteopenia   ASSESSMENT: 57 y.o. [place] woman status post right breast lower inner quadrant lumpectomy and sentinel lymph node sampling 08/27/2014 for a pT1c pN0, stage IA invasive ductal carcinoma, grade 3, estrogen and progesterone receptor positive, HER-2 negative, with an MIB-1 of 66%  (1) adjuvant chemotherapy with cyclophosphamide and docetaxel every 21 days 4, with onpro support, completed 11/26/2014  (2) adjuvant radiation 01/17/2015-03/06/2015: Right breast 50.4 gray in 28 fractions, lumpectomy cavity boost 12 gray in 6 fractions  (3) anastrozole: started 04/16/2015  (a) bone density scan 01/24/2015 shows osteopenia with a T score of -1.7.  (b) bone density on 09/03/2017 shows a T score of -1.4 osteopenia  (4) genetics panel negative for any mutations in the following genes: APC, ATM, AXIN2, BARD1, BMPR1A, BRCA1, BRCA2, BRIP1, CDH1, CDK4, CDKN2A, CHEK2, EPCAM, FANCC, MLH1, MSH2, MSH6, MUTYH, NBN, PALB2, PMS2, POLD1, POLE, PTEN, RAD51C, RAD51D, SCG5/GREM1, SMAD4, STK11, TP53, VHL, and XRCC2.   (5) R LE DVT documented 03/20/2014 following right sacroiliac joint effusion 02/22/2014  (a) hypercoagulable panel normal  (b) received Rivaroxaban for 6 months, completed April 2016   (c) right lower extremity venous Doppler 04/02/2015 was read as suspicious for a small nonocclusive thrombus in the right distal femoral vein. This is felt to be a very low thrombus pertinent with preserved phasic flow and augmentation.  (d)  rivaroxaban resumed October 2016--to be continued indefinitely  (6) obesity: Contemplating bariatric surgery  (7) thrombocytopenia, stable  PLAN: Teresa Aguilar is now a little over 3 years out from definitive surgery for her breast cancer with no evidence of disease recurrence.  This is very favorable.  She is tolerating anastrozole generally quite well.  The plan is to continue that for a total of 5 years.  She does have near osteoporosis.  We discussed vitamin D issues.  She unfortunately cannot walk very far because of her back problems.  She is able to do a little bit of exercise in the swimming pool sometimes.  At this point we are not going to add Fosamax or Boniva but simply encourage her to do as much weightbearing exercise that she is able to tolerate  She will see me again in a year.  She knows to call for any problems that may develop before that visit.  Magrinat, [name], MD  12/06/17 2:19 PM Medical Oncology and Hematology [facility] [address] [place], [place] [address] Tel. [phone]    Fax. [phone]  Alice Rieger, am acting as scribe for Chauncey Cruel MD.  I, Lurline Del MD, have reviewed the above documentation for accuracy and completeness, and I agree with the above.

## 2017-12-06 ENCOUNTER — Other Ambulatory Visit: Payer: Self-pay

## 2017-12-06 ENCOUNTER — Telehealth: Payer: Self-pay | Admitting: Oncology

## 2017-12-06 ENCOUNTER — Inpatient Hospital Stay: Payer: BC Managed Care – PPO

## 2017-12-06 ENCOUNTER — Inpatient Hospital Stay: Payer: BC Managed Care – PPO | Attending: Oncology | Admitting: Oncology

## 2017-12-06 VITALS — BP 120/69 | HR 70 | Temp 98.4°F | Resp 18 | Ht 64.75 in | Wt 213.3 lb

## 2017-12-06 DIAGNOSIS — Z8042 Family history of malignant neoplasm of prostate: Secondary | ICD-10-CM | POA: Insufficient documentation

## 2017-12-06 DIAGNOSIS — Z79811 Long term (current) use of aromatase inhibitors: Secondary | ICD-10-CM | POA: Diagnosis not present

## 2017-12-06 DIAGNOSIS — Z800 Family history of malignant neoplasm of digestive organs: Secondary | ICD-10-CM | POA: Diagnosis not present

## 2017-12-06 DIAGNOSIS — R110 Nausea: Secondary | ICD-10-CM | POA: Diagnosis not present

## 2017-12-06 DIAGNOSIS — D696 Thrombocytopenia, unspecified: Secondary | ICD-10-CM | POA: Insufficient documentation

## 2017-12-06 DIAGNOSIS — C50311 Malignant neoplasm of lower-inner quadrant of right female breast: Secondary | ICD-10-CM | POA: Insufficient documentation

## 2017-12-06 DIAGNOSIS — M8580 Other specified disorders of bone density and structure, unspecified site: Secondary | ICD-10-CM | POA: Insufficient documentation

## 2017-12-06 DIAGNOSIS — M1990 Unspecified osteoarthritis, unspecified site: Secondary | ICD-10-CM | POA: Diagnosis not present

## 2017-12-06 DIAGNOSIS — Z923 Personal history of irradiation: Secondary | ICD-10-CM

## 2017-12-06 DIAGNOSIS — Z79899 Other long term (current) drug therapy: Secondary | ICD-10-CM | POA: Diagnosis not present

## 2017-12-06 DIAGNOSIS — Z86718 Personal history of other venous thrombosis and embolism: Secondary | ICD-10-CM | POA: Insufficient documentation

## 2017-12-06 DIAGNOSIS — K219 Gastro-esophageal reflux disease without esophagitis: Secondary | ICD-10-CM | POA: Insufficient documentation

## 2017-12-06 DIAGNOSIS — Z803 Family history of malignant neoplasm of breast: Secondary | ICD-10-CM | POA: Diagnosis not present

## 2017-12-06 DIAGNOSIS — Z9221 Personal history of antineoplastic chemotherapy: Secondary | ICD-10-CM

## 2017-12-06 DIAGNOSIS — R5383 Other fatigue: Secondary | ICD-10-CM | POA: Diagnosis not present

## 2017-12-06 DIAGNOSIS — Z170 Estrogen receptor positive status [ER+]: Secondary | ICD-10-CM

## 2017-12-06 DIAGNOSIS — E669 Obesity, unspecified: Secondary | ICD-10-CM | POA: Insufficient documentation

## 2017-12-06 LAB — CBC WITH DIFFERENTIAL (CANCER CENTER ONLY)
BASOS PCT: 1 %
Basophils Absolute: 0 10*3/uL (ref 0.0–0.1)
Eosinophils Absolute: 0 10*3/uL (ref 0.0–0.5)
Eosinophils Relative: 1 %
HCT: 40.9 % (ref 34.8–46.6)
HEMOGLOBIN: 13.6 g/dL (ref 11.6–15.9)
LYMPHS ABS: 0.8 10*3/uL — AB (ref 0.9–3.3)
LYMPHS PCT: 21 %
MCH: 31.4 pg (ref 25.1–34.0)
MCHC: 33.3 g/dL (ref 31.5–36.0)
MCV: 94.2 fL (ref 79.5–101.0)
MONO ABS: 0.3 10*3/uL (ref 0.1–0.9)
MONOS PCT: 8 %
NEUTROS ABS: 2.8 10*3/uL (ref 1.5–6.5)
NEUTROS PCT: 69 %
Platelet Count: 96 10*3/uL — ABNORMAL LOW (ref 145–400)
RBC: 4.34 MIL/uL (ref 3.70–5.45)
RDW: 14.5 % (ref 11.2–14.5)
WBC Count: 4 10*3/uL (ref 3.9–10.3)

## 2017-12-06 LAB — CMP (CANCER CENTER ONLY)
ALBUMIN: 4 g/dL (ref 3.5–5.0)
ALT: 9 U/L (ref 0–55)
AST: 15 U/L (ref 5–34)
Alkaline Phosphatase: 101 U/L (ref 40–150)
Anion gap: 8 (ref 3–11)
BUN: 19 mg/dL (ref 7–26)
CHLORIDE: 107 mmol/L (ref 98–109)
CO2: 27 mmol/L (ref 22–29)
Calcium: 9.1 mg/dL (ref 8.4–10.4)
Creatinine: 0.71 mg/dL (ref 0.60–1.10)
GFR, Est AFR Am: >60 mL/min (ref >60)
GFR, Estimated: >60 mL/min (ref >60)
GLUCOSE: 97 mg/dL (ref 70–140)
POTASSIUM: 4.4 mmol/L (ref 3.5–5.1)
Sodium: 142 mmol/L (ref 136–145)
Total Bilirubin: 0.5 mg/dL (ref 0.2–1.2)
Total Protein: 6.7 g/dL (ref 6.4–8.3)

## 2017-12-06 NOTE — Telephone Encounter (Signed)
Gave patient avs report and appointments for June 2020.  °

## 2018-01-13 ENCOUNTER — Other Ambulatory Visit: Payer: Self-pay | Admitting: Oncology

## 2018-04-18 DIAGNOSIS — M5416 Radiculopathy, lumbar region: Secondary | ICD-10-CM | POA: Diagnosis not present

## 2018-04-18 DIAGNOSIS — M545 Low back pain: Secondary | ICD-10-CM | POA: Diagnosis not present

## 2018-04-26 DIAGNOSIS — M47816 Spondylosis without myelopathy or radiculopathy, lumbar region: Secondary | ICD-10-CM | POA: Diagnosis not present

## 2018-04-26 DIAGNOSIS — Z981 Arthrodesis status: Secondary | ICD-10-CM | POA: Diagnosis not present

## 2018-04-26 DIAGNOSIS — M7602 Gluteal tendinitis, left hip: Secondary | ICD-10-CM | POA: Diagnosis not present

## 2018-04-26 DIAGNOSIS — M7601 Gluteal tendinitis, right hip: Secondary | ICD-10-CM | POA: Diagnosis not present

## 2018-04-26 DIAGNOSIS — M47817 Spondylosis without myelopathy or radiculopathy, lumbosacral region: Secondary | ICD-10-CM | POA: Diagnosis not present

## 2018-04-29 DIAGNOSIS — M5416 Radiculopathy, lumbar region: Secondary | ICD-10-CM | POA: Diagnosis not present

## 2018-08-25 IMAGING — RF DG UGI W/ KUB
9 of 12 series · 14 of 24 positions shown · non-contrast
Comparison: None.

CLINICAL DATA: Morbid obesity.

EXAM:
UPPER GI SERIES WITH KUB
TECHNIQUE: After obtaining a scout radiograph a routine upper GI series was
performed using thin density barium.
FLUOROSCOPY TIME:  Fluoroscopy Time:  1.6 minutes
Radiation Exposure Index (if provided by the fluoroscopic device):
36.40 mGy

[Series 1: t abdomen supine · 0.15mm/px · 1 of 1 slices shown]
[im 1/1]
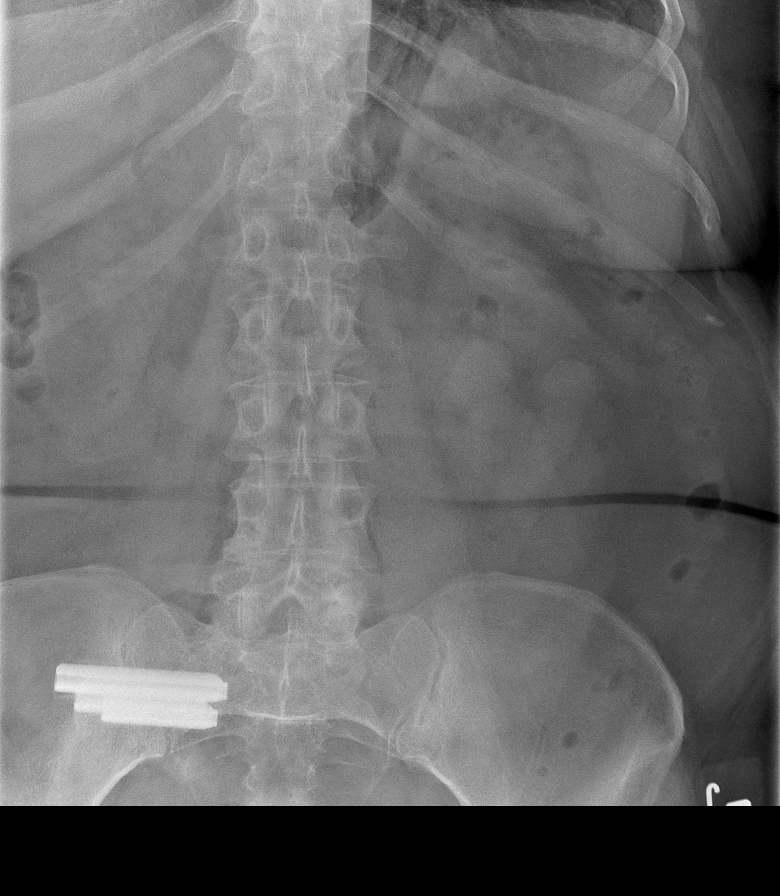

[Series 2: cp_standard · 0.54mm/px · 1 of 57 frames shown (1 of 5)]
[frame 19/57]
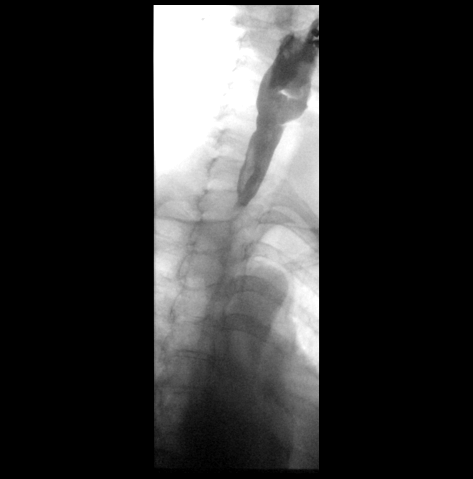

[Series 3: cp_standard · 0.54mm/px · 3 of 9 frames shown (2 of 5)]
[frame 2/9]
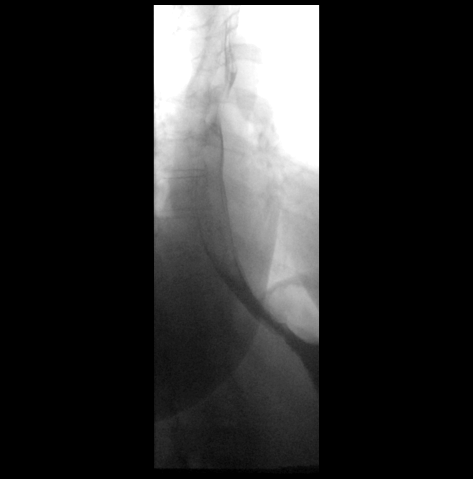
[frame 8/9]
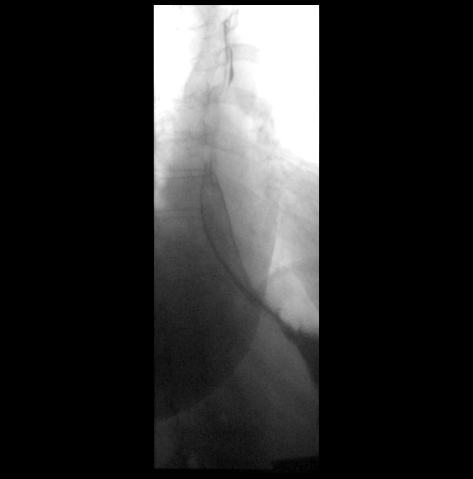
[frame 9/9]
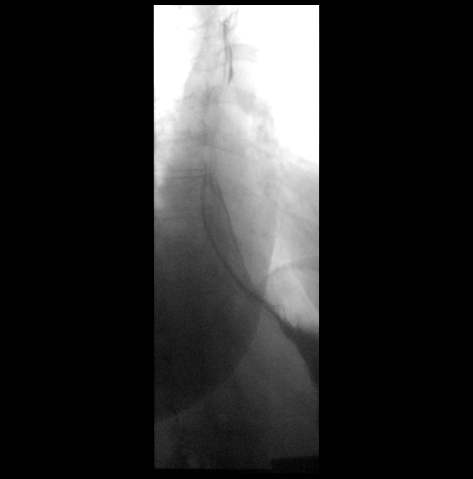

[Series 5: fluoro_barium 2fps_bw · 0.21mm/px · 1 of 1 slices shown (1 of 3)]
[im 1/1]
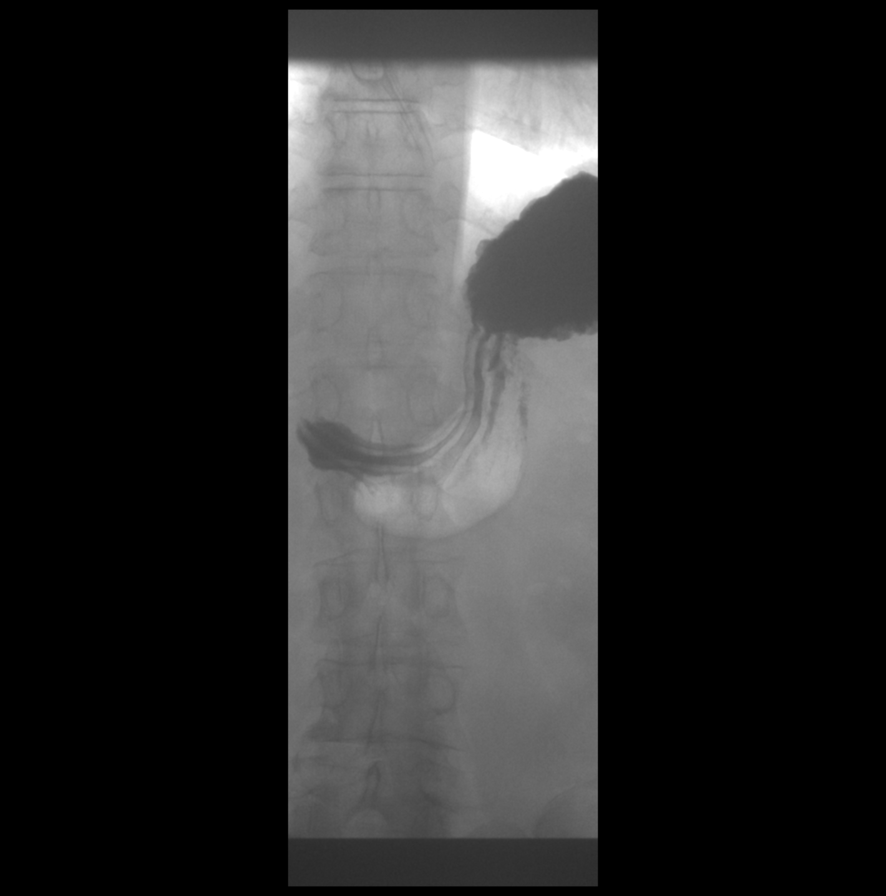

[Series 6: cp_standard · 0.59mm/px · 2 of 9 frames shown (3 of 5)]
[frame 2/9]
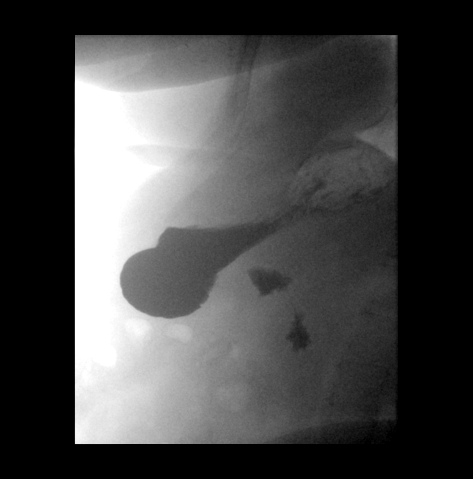
[frame 5/9]
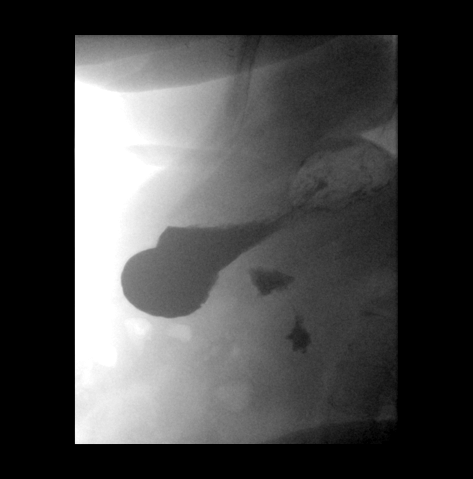

[Series 7: cp_standard · 0.59mm/px · 2 of 24 frames shown (4 of 5)]
[frame 3/24]
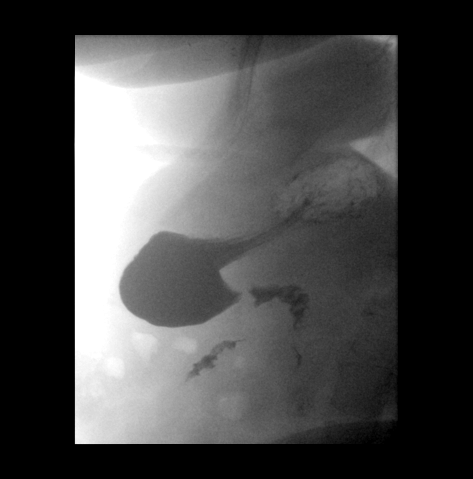
[frame 21/24]
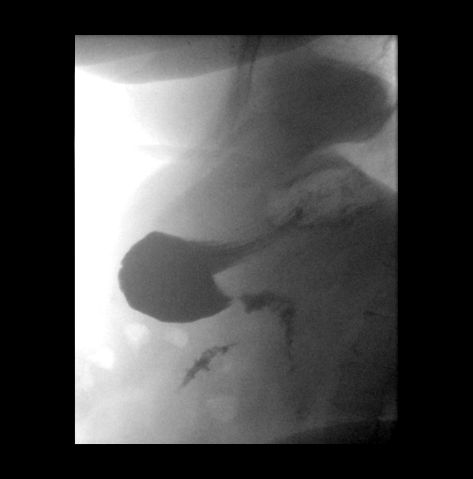

[Series 9: fluoro_barium 2fps_bw · 0.20mm/px · 1 of 1 slices shown (2 of 3)]
[im 1/1]
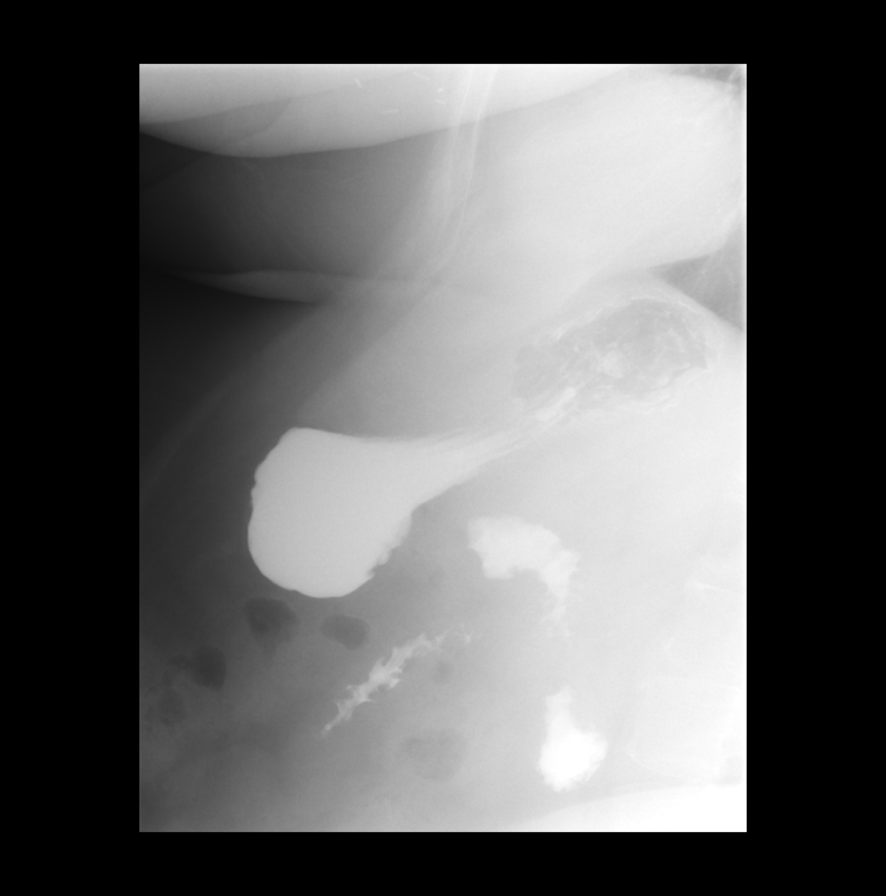

[Series 10: cp_standard · 0.59mm/px · 2 of 21 frames shown (5 of 5)]
[frame 4/21]
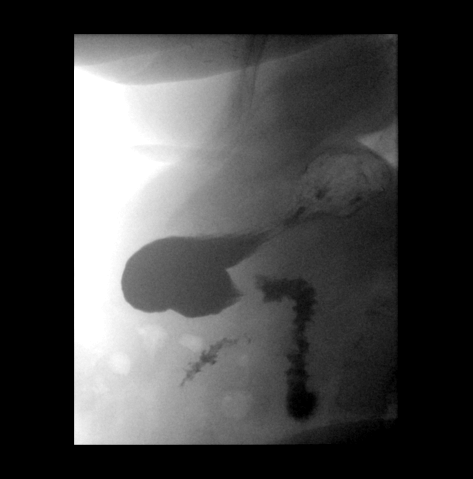
[frame 20/21]
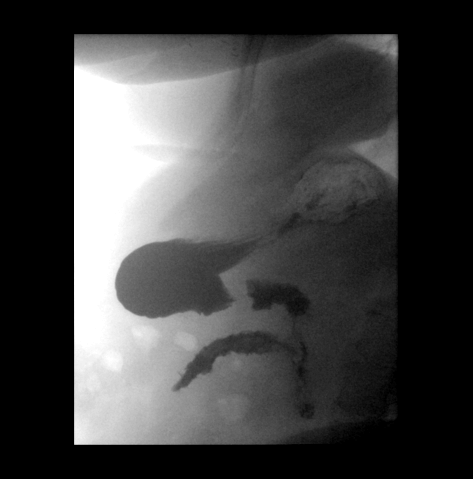

[Series 12: fluoro_barium 2fps_bw · 0.21mm/px · 1 of 1 slices shown (3 of 3)]
[im 1/1]
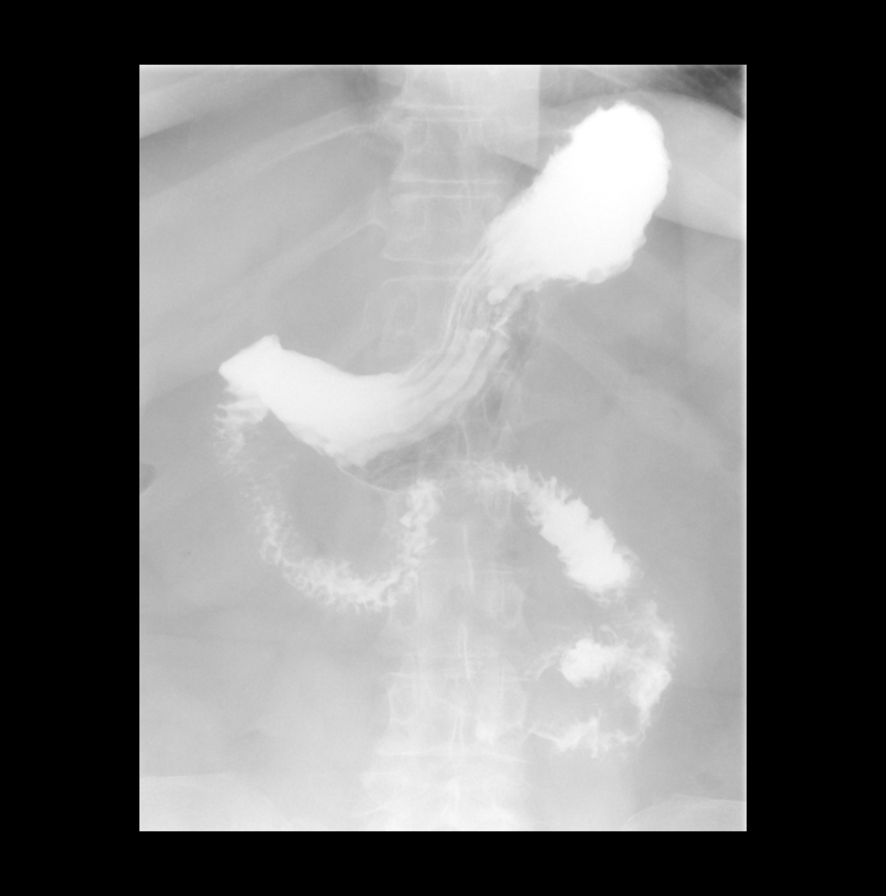

[14 of 24 positions shown; findings below may reference images not displayed]

FINDINGS: The mucosa and motility of the esophagus are normal. No evidence of
hiatal hernia or spontaneous gastroesophageal reflux.

The fundus, body, and antrum of the stomach are normal.

The pylorus and duodenal bulb and C-loop are normal.

The KUB demonstrates a normal bowel gas pattern. Metallic fixation
devices across the right SI joint.
IMPRESSION: Normal upper GI.

## 2018-10-13 ENCOUNTER — Encounter: Payer: Self-pay | Admitting: Oncology

## 2018-10-14 ENCOUNTER — Telehealth: Payer: Self-pay | Admitting: *Deleted

## 2018-10-14 NOTE — Telephone Encounter (Signed)
Attempted to call patient regarding My Chart message.  Diagnostic mammogram is recommended for 5 years after surgery. Bone Density is every 2 years.

## 2018-10-17 ENCOUNTER — Encounter (HOSPITAL_COMMUNITY): Payer: Self-pay

## 2018-10-25 ENCOUNTER — Other Ambulatory Visit: Payer: Self-pay | Admitting: Oncology

## 2018-10-25 DIAGNOSIS — Z853 Personal history of malignant neoplasm of breast: Secondary | ICD-10-CM

## 2018-11-02 ENCOUNTER — Other Ambulatory Visit: Payer: Self-pay | Admitting: *Deleted

## 2018-11-02 MED ORDER — ANASTROZOLE 1 MG PO TABS: 1.0000 mg | ORAL_TABLET | Freq: Every day | ORAL | 3 refills | Status: DC

## 2018-11-11 ENCOUNTER — Other Ambulatory Visit: Payer: Self-pay

## 2018-11-11 ENCOUNTER — Ambulatory Visit
Admission: RE | Admit: 2018-11-11 | Discharge: 2018-11-11 | Disposition: A | Discharge status: Home / Self Care | Payer: Medicare Other | Source: Ambulatory Visit | Attending: Oncology | Admitting: Oncology

## 2018-11-11 DIAGNOSIS — Z853 Personal history of malignant neoplasm of breast: Secondary | ICD-10-CM

## 2018-12-02 ENCOUNTER — Telehealth: Payer: Self-pay

## 2018-12-02 NOTE — Telephone Encounter (Signed)
Called and left voicemail regarding pre-screening questions for appt on 6/22  

## 2018-12-04 NOTE — Progress Notes (Signed)
[name]  [phone] Fax:([phone]) [phone]     ID: [name] DOB: Aug 26, 1960  MR#: [id number]  HYI#:[id number]  Patient Care Team: [name], MD as PCP - General (Family Medicine) [name], MD as Consulting Physician (General Surgery) [name], [name], MD as Consulting Physician (Oncology) [name], MD as Consulting Physician (Radiation Oncology) [name], NP as Nurse Practitioner (Hematology and Oncology) [name], MD as Consulting Physician (Gastroenterology) [name], MD as Consulting Physician (Obstetrics and Gynecology) [name], MD as Consulting Physician (General Surgery) OTHER MD: [name] MD, Phylliss Bob MD, Jamie Kato MD   CHIEF COMPLAINT: Estrogen receptor positive breast cancer  CURRENT TREATMENT: anastrozole, rivaroxaban   BREAST CANCER HISTORY:   from the original intake note:  "Teresa Aguilar" had routine screening mammography January 2016 in Dr. Tonette Bihari office showing a possible change in the right breast. On 07/24/2014 at the breast Center she underwent right digital mammography and ultrasonography. The breast density was category A. In the lower inner quadrant of the right breast there was a 2 cm mass which was palpable by exam. Ultrasound confirmed a 1.5 cm irregular hypoechoic mass at the 3:30 o'clock position 12 cm from the nipple. There was no right axillary adenopathy.  Biopsy of the mass in question 07/24/2014 showed ([id number]) invasive ductal carcinoma, grade 2 or 3, estrogen receptor 98% positive, progesterone receptor 76% positive, both with strong staining intensity, with an MIB-1 of 66% and no HER-2 amplification by FISH.  The patient's case was discussed at the multidisciplinary breast cancer conference 08/08/2014 and it was felt the patient would benefit from breast conserving surgery and likely would need an Oncotype to decide on optimal systemic  therapy. She would need radiation and hormones. The question of genetics counseling was also raised.  On 08/27/2014 the patient underwent right lumpectomy and sentinel lymph node sampling. The final pathology ([id number]) confirmed invasive ductal carcinoma, grade 3, measuring 1.9 cm. There was evidence of lymphovascular and perineural invasion. Margins were negative but close, the closest margin for the in situ component being 1 mm. The single sentinel lymph node was negative. Repeat HER-2 was again negative, with a signals ratio of 0.97 and a number per cell of 1.60.  The patient's subsequent history is as detailed below   INTERVAL HISTORY: Teresa Aguilar returns today for follow-up and treatment of her estrogen receptor positive breast cancer.  She continues on anastrozole. She has some hot flashes, but they do not wake her up during the night.   She also continues rivaroxaban. She tolerates this well and without any noticeable side effects.   [name]'s last bone density screening on 09/03/2017, showed a T-score of -2.3, which is considered osteopenic.    Since her last visit here, she underwent a digital diagnostic bilateral mammogram with tomography on 11/11/2018 showing: Breast Density Category B. There is no mammographic evidence of malignancy.     REVIEW OF SYSTEMS: Teresa Aguilar says that she stays weak a lot; she doesn't feel like getting up a lot of days, and she gets tired after sweeping the floor or walking to the mailbox and back. She has several bulging discs in her back, which gives her a lot of difficulties. She has fallen as a result, with her last fall about 2 months ago. Her right leg goes out on her on occasion, which is what causes her falls. She has some constipation, which she treats with stool softeners and Miralax. She has some  mild bladder leakage, but she knows when she has to go. The patient denies unusual visual changes, nausea, vomiting, or dizziness. There has been no unusual  cough, phlegm production, or pleurisy. The patient denies unexplained weight loss, bleeding, rash, or fever. A detailed review of systems was otherwise noncontributory.    PAST MEDICAL HISTORY: Past Medical History:  Diagnosis Date   Anxiety    relative for to pain & frustration of prev. hospitalization    Arthritis    degenerative lumbar spine     Breast cancer ([facility]) 2016   Breast cancer of lower-inner quadrant of right female breast ([facility])    Chronic back pain    Colon cancer ([facility])    DVT (deep venous thrombosis) ([facility]) 03/2014   R leg, post op- on Xarelto    Family history of adverse reaction to anesthesia    PONV   Family history of breast cancer    Family history of colon cancer    GERD (gastroesophageal reflux disease)    used during chemo, has not taken recently   Gestational diabetes 1986; 1996   H/O cardiovascular stress test 2012    test done for stress from her Father dying, had chest pain ,pt. had a stress/echo  test, told it was wnl   Hypertension    pt. reports that she has been higher in the past & again now but doesn't take any med.,never has for ^BP, pt. stating that BP is high now b/c of her pain in her back.     Hypoxia    after surgery   Migraines    "at least q other week" (08/27/2014)   Personal history of chemotherapy    2016   Personal history of radiation therapy    2016   PONV (postoperative nausea and vomiting)    low O2 sats, < 50% post op   Radiation 01/17/15-03/06/15   right breast 50.4 gray, lumpectomy cavity boost to 12 gray    PAST SURGICAL HISTORY: Past Surgical History:  Procedure Laterality Date   AXILLARY SENTINEL NODE BIOPSY Right 08/27/2014   Archie Endo 08/27/2014   BACK SURGERY     BREAST BIOPSY Right 07/24/2014   core biopsy   BREAST LUMPECTOMY Right 08/27/2014   Archie Endo 08/27/2014   COLONOSCOPY W/ POLYPECTOMY  2015   found tubulovillous adenoma with focal high grade displasia   DENTAL SURGERY  ~ 2011   "replaced  bone graft upper jaw; put 2 implants in"   ENDOMETRIAL ABLATION  ~ 1998   EXCISIONAL HEMORRHOIDECTOMY  2015   EYE MUSCLE SURGERY Bilateral ~ 1965   to fix cross-eye as child   LAPAROSCOPIC GASTRIC SLEEVE RESECTION N/A 03/02/2017   Procedure: LAPAROSCOPIC GASTRIC SLEEVE RESECTION WITH UPPER ENDOSCOPY;  Surgeon: [name], MD;  Location: [facility];  Service: General;  Laterality: N/A;   PLANTAR FASCIA RELEASE Right ~ 2000   PORT-A-CATH REMOVAL Right 12/31/2014   Procedure: REMOVAL PORT-A-CATH;  Surgeon: [name], MD;  Location: [facility];  Service: General;  Laterality: Right;   PORTACATH PLACEMENT N/A 09/19/2014   Procedure: INSERTION PORT-A-CATH;  Surgeon: [name], MD;  Location: [facility];  Service: General;  Laterality: N/A;   RADIOACTIVE SEED GUIDED PARTIAL MASTECTOMY WITH AXILLARY SENTINEL LYMPH NODE BIOPSY Right 08/27/2014   Procedure: RADIOACTIVE SEED GUIDED RIGHT BREAST LUMPECTOMY WITH RIGHT AXILLARY SENTINEL LYMPH NODE BIOPSY;  Surgeon: [name], MD;  Location: [facility];  Service: General;  Laterality: Right;   RADIOFREQUENCY ABLATION NERVES     x  2 to nerves in her lower back   [place] Right 02/22/2014   Procedure: SACROILIAC JOINT FUSION;  Surgeon: Sinclair Ship, MD;  Location: [facility];  Service: Orthopedics;  Laterality: Right;  Right sided sacroiliac joint fusion   TONSILLECTOMY  1988   TUBAL LIGATION  1996    FAMILY HISTORY Family History  Problem Relation Age of Onset   Breast cancer Cousin 34       maternal first cousin   Colon cancer Maternal Aunt 75   Throat cancer Maternal Aunt 75       smoker   Breast cancer Paternal Aunt 39   Heart disease Mother    Heart disease Father    Diabetes type II Father    Stomach cancer Maternal Uncle        dx in his 67s   Colon cancer Cousin 10        maternal first cousin   Prostate cancer Cousin 7       paternal first cousin   Breast cancer Paternal Aunt        dx  in her 62s   Breast cancer Other        mother's paternal first cousin   Colon cancer Other 25       MGF's sister   Brain cancer Other 98       MGFs sister   Breast cancer Other        MGF's paternal aunt   Prostate cancer Other        MGF's paternal first cousin   Breast cancer Paternal Aunt    Cancer Other    Hyperlipidemia Other    Diabetes Other    the patient's father died at the age of 4 from heart failure in the setting of diabetes. The patient's mother is currently 68 years old. The patient has 3 brothers, 2 sisters. There is no history of breast, ovarian, or colon cancer in first-degree relatives. However, on the mother's side the patient has 3 relatives with breast cancer (a cousin diagnosed age 79, a second cousin diagnosed age 9, and a great aunt). There is also a history of colon cancer diagnosed age around age 77 in a great aunt and small intestinal cancer in an aunt diagnosed age 67. On the father's side there are 2 cousins with breast cancer diagnosed age 28 and 8, and a cousin with colon cancer diagnosed age 84  GYNECOLOGIC HISTORY:  No LMP recorded. Patient has had an ablation. Menarche age 84, the patient carried 2 children to term, the first at age 73. After the first live birth she had a premature girl who died after one day (she had been a breech birth) and she also had 3 miscarriages. The patient underwent endometrial ablation in 1998 and has had no periods since that time.  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 Her husband Braulio Conte works for [employer] in the [place] and Manufacturing systems engineer. Son Icel Castles lives in [place] and works in Education officer, community. He was an Gaffer) and son Blaise Palladino is 58 and works for [employer]. He also lives in [place]. The patient has one granddaughter. The patient is a Psychologist, forensic    ADVANCED DIRECTIVES: Not in  place   HEALTH MAINTENANCE: Social History   Tobacco Use   Smoking status: Never Smoker   Smokeless tobacco: Never Used  Substance Use Topics   Alcohol  use: No   Drug use: No     Colonoscopy: June 2015/May and last repeat planned 10/03/2014  PAP: 2015  Bone density: 09/03/2017 shows a T score of -1.4 osteopenia  Lipid panel:  Allergies  Allergen Reactions   Morphine And Related Nausea And Vomiting   Sulfa Antibiotics Hives   Meloxicam Rash    Current Outpatient Medications  Medication Sig Dispense Refill   anastrozole (ARIMIDEX) 1 MG tablet Take 1 tablet (1 mg total) by mouth daily. 90 tablet 3   eletriptan (RELPAX) 40 MG tablet Take 40 mg by mouth every 2 (two) hours as needed for migraine. Reported on 10/01/2015     enoxaparin (LOVENOX) 30 MG/0.3ML injection Inject 0.3 mLs (30 mg total) into the skin every 12 (twelve) hours. 10 Syringe 0   No current facility-administered medications for this visit.     OBJECTIVE: Morbidly obese white woman who appears older than stated age   18:   12/05/18 1332  BP: 116/69  Pulse: 98  Resp: 18  Temp: 98.7 F (37.1 C)  SpO2: 99%     Body mass index is 40.48 kg/m.    ECOG FS:1 - Symptomatic but completely ambulatory Filed Weights   12/05/18 1332  Weight: 241 lb 6.4 oz (109.5 kg)    Sclerae unicteric, EOMs intact No cervical or supraclavicular adenopathy Lungs no rales or rhonchi Heart regular rate and rhythm Abd soft, nontender, positive bowel sounds MSK no focal spinal tenderness, no upper extremity lymphedema Neuro: nonfocal, well oriented, appropriate affect Breasts: The right breast is status post lumpectomy followed by radiation.  There is no evidence of local recurrence.  The cosmetic result is good.  The left breast is unremarkable.  Both axillae are benign Skin: Though she has some itching in the inferior mammary fold on the right I do not see any evidence of a rash  LAB RESULTS:  Lab work obtained  through Dr. Josph Macho [facility] lab on 12/01/2018 shows a white cell count of 5.2, hemoglobin 14.3, MCV 95.3, platelets 130, vitamin D3 level of 40, sodium 142 potassium 4.1 BUN 21 creatinine 0.77 glucose 120, normal proteins, normal liver function tests.  CMP     Component Value Date/Time   NA 142 12/06/2017 1340   NA 143 09/21/2016 1131   K 4.4 12/06/2017 1340   K 4.0 09/21/2016 1131   CL 107 12/06/2017 1340   CO2 27 12/06/2017 1340   CO2 26 09/21/2016 1131   GLUCOSE 97 12/06/2017 1340   GLUCOSE 189 (H) 09/21/2016 1131   BUN 19 12/06/2017 1340   BUN 15.9 09/21/2016 1131   CREATININE 0.71 12/06/2017 1340   CREATININE 0.9 09/21/2016 1131   CALCIUM 9.1 12/06/2017 1340   CALCIUM 9.4 09/21/2016 1131   PROT 6.7 12/06/2017 1340   PROT 6.9 09/21/2016 1131   ALBUMIN 4.0 12/06/2017 1340   ALBUMIN 3.9 09/21/2016 1131   AST 15 12/06/2017 1340   AST 22 09/21/2016 1131   ALT 9 12/06/2017 1340   ALT 26 09/21/2016 1131   ALKPHOS 101 12/06/2017 1340   ALKPHOS 91 09/21/2016 1131   BILITOT 0.5 12/06/2017 1340   BILITOT 0.55 09/21/2016 1131   GFRNONAA >60 12/06/2017 1340   GFRAA >60 12/06/2017 1340    INo results found for: SPEP, UPEP  Lab Results  Component Value Date   WBC 4.0 12/06/2017   NEUTROABS 2.8 12/06/2017   HGB 13.6 12/06/2017   HCT 40.9 12/06/2017   MCV 94.2 12/06/2017   PLT 96 (L)  12/06/2017      Chemistry      Component Value Date/Time   NA 142 12/06/2017 1340   NA 143 09/21/2016 1131   K 4.4 12/06/2017 1340   K 4.0 09/21/2016 1131   CL 107 12/06/2017 1340   CO2 27 12/06/2017 1340   CO2 26 09/21/2016 1131   BUN 19 12/06/2017 1340   BUN 15.9 09/21/2016 1131   CREATININE 0.71 12/06/2017 1340   CREATININE 0.9 09/21/2016 1131      Component Value Date/Time   CALCIUM 9.1 12/06/2017 1340   CALCIUM 9.4 09/21/2016 1131   ALKPHOS 101 12/06/2017 1340   ALKPHOS 91 09/21/2016 1131   AST 15 12/06/2017 1340   AST 22 09/21/2016 1131   ALT 9 12/06/2017 1340   ALT 26  09/21/2016 1131   BILITOT 0.5 12/06/2017 1340   BILITOT 0.55 09/21/2016 1131       No results found for: LABCA2  No components found for: LABCA125  No results for input(s): INR in the last 168 hours.  Urinalysis    Component Value Date/Time   COLORURINE AMBER (A) 02/21/2014 1540   APPEARANCEUR CLOUDY (A) 02/21/2014 1540   LABSPEC 1.029 02/21/2014 1540   PHURINE 5.0 02/21/2014 1540   GLUCOSEU NEGATIVE 02/21/2014 1540   HGBUR SMALL (A) 02/21/2014 1540   BILIRUBINUR SMALL (A) 02/21/2014 1540   KETONESUR 15 (A) 02/21/2014 1540   PROTEINUR NEGATIVE 02/21/2014 1540   UROBILINOGEN 1.0 02/21/2014 1540   NITRITE POSITIVE (A) 02/21/2014 1540   LEUKOCYTESUR TRACE (A) 02/21/2014 1540    STUDIES: Mm Diag Breast Tomo Bilateral  Result Date: 11/11/2018 CLINICAL DATA:  58 year old female presenting for routine annual surveillance status post right breast lumpectomy in 2016. EXAM: DIGITAL DIAGNOSTIC BILATERAL MAMMOGRAM WITH CAD AND TOMO COMPARISON:  Previous exam(s). ACR Breast Density Category b: There are scattered areas of fibroglandular density. FINDINGS: The right breast lumpectomy site is stable. No suspicious calcifications, masses or areas of distortion are seen in the bilateral breasts. Mammographic images were processed with CAD. IMPRESSION: Stable right breast lumpectomy site. No mammographic evidence of malignancy in the bilateral breasts. RECOMMENDATION: Diagnostic mammogram is suggested in 1 year. (Code:DM-B-01Y) I have discussed the findings and recommendations with the patient. Results were also provided in writing at the conclusion of the visit. If applicable, a reminder letter will be sent to the patient regarding the next appointment. BI-RADS CATEGORY  2: Benign. Electronically Signed   By: Ammie Ferrier M.D.   On: 11/11/2018 15:52     ASSESSMENT: 58 y.o. [place] woman status post right breast lower inner quadrant lumpectomy and sentinel lymph node sampling 08/27/2014  for a pT1c pN0, stage IA invasive ductal carcinoma, grade 3, estrogen and progesterone receptor positive, HER-2 negative, with an MIB-1 of 66%  (1) adjuvant chemotherapy with cyclophosphamide and docetaxel every 21 days 4, with onpro support, completed 11/26/2014  (2) adjuvant radiation 01/17/2015-03/06/2015: Right breast 50.4 gray in 28 fractions, lumpectomy cavity boost 12 gray in 6 fractions  (3) anastrozole: started 04/16/2015  (a) bone density scan 01/24/2015 shows osteopenia with a T score of -1.7.  (b) bone density on 09/03/2017 shows a T score of -1.4 osteopenia  (4) genetics panel negative for any mutations in the following genes: APC, ATM, AXIN2, BARD1, BMPR1A, BRCA1, BRCA2, BRIP1, CDH1, CDK4, CDKN2A, CHEK2, EPCAM, FANCC, MLH1, MSH2, MSH6, MUTYH, NBN, PALB2, PMS2, POLD1, POLE, PTEN, RAD51C, RAD51D, SCG5/GREM1, SMAD4, STK11, TP53, VHL, and XRCC2.   (5) R LE DVT documented 03/20/2014 following right sacroiliac  joint effusion 02/22/2014  (a) hypercoagulable panel normal  (b) received Rivaroxaban for 6 months, completed April 2016   (c) right lower extremity venous Doppler 04/02/2015 was read as suspicious for a small nonocclusive thrombus in the right distal femoral vein. This is felt to be a very low thrombus pertinent with preserved phasic flow and augmentation.  (d) rivaroxaban resumed October 2016--to be continued indefinitely  (6) morbid obesity: Status post laparoscopic sleeve gastrectomy 03/02/2017  (7) thrombocytopenia, stable   PLAN: Teresa Aguilar is now a little over 4 years out from definitive surgery for her breast cancer with no evidence of disease recurrence.  This is very favorable.  She is tolerating anastrozole well and the plan will be to continue that for 5 years which will take as to November of next year.  Her last bone density showed mild osteopenia.  We will repeat that with her next set of mammograms next May.  She pointed out to me she was billed $500 for her  last set of labs here.  She had lab work done at Dr. Redmond Pulling a week ago and that was entirely adequate accordingly I am not rechecking labs here when she returns a year from now.  That likely will be her "graduation visit".  I did add a bone density to her next mammography  She knows to call for any other issue that may develop before the next visit.   Lazara Grieser, [name], MD  12/05/18 2:02 PM Medical Oncology and Hematology [facility] [address] [place], [place] [address] Tel. [phone]    Fax. [phone]  I, Jacqualyn Posey am acting as a Education administrator for Chauncey Cruel, MD.   I, Lurline Del MD, have reviewed the above documentation for accuracy and completeness, and I agree with the above.

## 2018-12-05 ENCOUNTER — Inpatient Hospital Stay: Payer: Medicare Other

## 2018-12-05 ENCOUNTER — Other Ambulatory Visit: Payer: Self-pay

## 2018-12-05 ENCOUNTER — Telehealth: Payer: Self-pay | Admitting: Oncology

## 2018-12-05 ENCOUNTER — Inpatient Hospital Stay: Payer: Medicare Other | Attending: Oncology | Admitting: Oncology

## 2018-12-05 VITALS — BP 116/69 | HR 98 | Temp 98.7°F | Resp 18 | Ht 64.75 in | Wt 241.4 lb

## 2018-12-05 DIAGNOSIS — R232 Flushing: Secondary | ICD-10-CM | POA: Diagnosis not present

## 2018-12-05 DIAGNOSIS — R531 Weakness: Secondary | ICD-10-CM | POA: Insufficient documentation

## 2018-12-05 DIAGNOSIS — Z170 Estrogen receptor positive status [ER+]: Secondary | ICD-10-CM | POA: Diagnosis not present

## 2018-12-05 DIAGNOSIS — Z7901 Long term (current) use of anticoagulants: Secondary | ICD-10-CM | POA: Insufficient documentation

## 2018-12-05 DIAGNOSIS — Z86718 Personal history of other venous thrombosis and embolism: Secondary | ICD-10-CM | POA: Insufficient documentation

## 2018-12-05 DIAGNOSIS — Z800 Family history of malignant neoplasm of digestive organs: Secondary | ICD-10-CM | POA: Diagnosis not present

## 2018-12-05 DIAGNOSIS — Z803 Family history of malignant neoplasm of breast: Secondary | ICD-10-CM | POA: Insufficient documentation

## 2018-12-05 DIAGNOSIS — C50311 Malignant neoplasm of lower-inner quadrant of right female breast: Secondary | ICD-10-CM | POA: Insufficient documentation

## 2018-12-05 DIAGNOSIS — R32 Unspecified urinary incontinence: Secondary | ICD-10-CM | POA: Insufficient documentation

## 2018-12-05 DIAGNOSIS — Z9181 History of falling: Secondary | ICD-10-CM | POA: Diagnosis not present

## 2018-12-05 DIAGNOSIS — K5900 Constipation, unspecified: Secondary | ICD-10-CM | POA: Insufficient documentation

## 2018-12-05 DIAGNOSIS — M8580 Other specified disorders of bone density and structure, unspecified site: Secondary | ICD-10-CM | POA: Diagnosis not present

## 2018-12-05 DIAGNOSIS — Z79811 Long term (current) use of aromatase inhibitors: Secondary | ICD-10-CM | POA: Insufficient documentation

## 2018-12-05 DIAGNOSIS — R5383 Other fatigue: Secondary | ICD-10-CM | POA: Insufficient documentation

## 2018-12-05 DIAGNOSIS — Z79899 Other long term (current) drug therapy: Secondary | ICD-10-CM | POA: Diagnosis not present

## 2018-12-05 DIAGNOSIS — D696 Thrombocytopenia, unspecified: Secondary | ICD-10-CM | POA: Insufficient documentation

## 2018-12-05 DIAGNOSIS — I10 Essential (primary) hypertension: Secondary | ICD-10-CM | POA: Insufficient documentation

## 2018-12-05 DIAGNOSIS — M1990 Unspecified osteoarthritis, unspecified site: Secondary | ICD-10-CM | POA: Diagnosis not present

## 2018-12-05 NOTE — Telephone Encounter (Signed)
I talk with patient regarding cancellation

## 2018-12-08 ENCOUNTER — Telehealth: Payer: Self-pay | Admitting: Oncology

## 2018-12-08 NOTE — Telephone Encounter (Signed)
I talk with patient regarding schedule  

## 2019-09-19 ENCOUNTER — Encounter (HOSPITAL_COMMUNITY): Payer: Self-pay

## 2019-10-13 ENCOUNTER — Other Ambulatory Visit: Payer: Self-pay | Admitting: *Deleted

## 2019-10-13 ENCOUNTER — Other Ambulatory Visit: Payer: Self-pay | Admitting: Oncology

## 2019-10-13 DIAGNOSIS — Z9889 Other specified postprocedural states: Secondary | ICD-10-CM

## 2019-10-13 DIAGNOSIS — C50311 Malignant neoplasm of lower-inner quadrant of right female breast: Secondary | ICD-10-CM

## 2019-10-13 DIAGNOSIS — Z170 Estrogen receptor positive status [ER+]: Secondary | ICD-10-CM

## 2019-11-01 ENCOUNTER — Other Ambulatory Visit: Payer: Self-pay | Admitting: Oncology

## 2019-12-29 ENCOUNTER — Inpatient Hospital Stay: Admission: RE | Admit: 2019-12-29 | Payer: Medicare Other | Source: Ambulatory Visit

## 2019-12-29 ENCOUNTER — Ambulatory Visit
Admission: RE | Admit: 2019-12-29 | Discharge: 2019-12-29 | Disposition: A | Discharge status: Home / Self Care | Payer: BC Managed Care – PPO | Source: Ambulatory Visit | Attending: Oncology | Admitting: Oncology

## 2019-12-29 ENCOUNTER — Other Ambulatory Visit: Payer: Self-pay

## 2019-12-29 DIAGNOSIS — Z9889 Other specified postprocedural states: Secondary | ICD-10-CM

## 2019-12-30 NOTE — Progress Notes (Signed)
Teresa Aguilar  Telephone:(336) [phone] Fax:(336) [phone]     ID: Teresa Aguilar DOB: [birth year]  MR#: [id number]  LYY#:[id number]  Patient Care Team: [name], MD as PCP - General (Family Medicine) [name], MD as Consulting Physician (General Surgery) Fedrick Cefalu, [name], MD as Consulting Physician (Oncology) [name], MD as Consulting Physician (Radiation Oncology) [name], NP as Nurse Practitioner (Hematology and Oncology) [name], MD as Consulting Physician (Gastroenterology) [name], MD as Consulting Physician (Obstetrics and Gynecology) [name], MD as Consulting Physician (General Surgery) OTHER MD: Phylliss Bob MD, Jamie Kato MD   CHIEF COMPLAINT: Estrogen receptor positive breast cancer  CURRENT TREATMENT: anastrozole   INTERVAL HISTORY: Teresa Aguilar returns today for follow-up of her estrogen receptor positive breast cancer.  She continues on anastrozole.  She does not have significant side effects from this medication which she obtains at a very good price.  She had been on lifelong rivaroxaban, but she tells me her insurance stopped paying for it and she could not afford the medication.  She was started on a daily 325 mg aspirin about 6 months ago.  She has had no unusual bruising or bleeding associated with this.  Teresa Aguilar's last bone density screening on 09/03/2017, showed a T-score of -2.3, which is considered osteopenic.    Since her last visit, she underwent bilateral diagnostic mammography with tomography at [facility] on 12/29/2019 showing: breast density category B; no evidence of malignancy in either breast.    REVIEW OF SYSTEMS: Teresa Aguilar has severe back pain.  This is being managed with "shots" but still she can only walk as far as her mailbox.  She received the [place] vaccine without complications.  She tells me her blood sugar is now less well controlled, with an A1c close to 8.   She tells me she is still drinking sodas and trying to wean herself off those.  She is not able to exercise regularly is just noted.  A detailed review of systems today was otherwise stable   BREAST CANCER HISTORY:  from the original intake note:  "Teresa Aguilar" had routine screening mammography January 2016 in Dr. Tonette Bihari office showing a possible change in the right breast. On 07/24/2014 at the breast Center she underwent right digital mammography and ultrasonography. The breast density was category A. In the lower inner quadrant of the right breast there was a 2 cm mass which was palpable by exam. Ultrasound confirmed a 1.5 cm irregular hypoechoic mass at the 3:30 o'clock position 12 cm from the nipple. There was no right axillary adenopathy.  Biopsy of the mass in question 07/24/2014 showed ([id number]) invasive ductal carcinoma, grade 2 or 3, estrogen receptor 98% positive, progesterone receptor 76% positive, both with strong staining intensity, with an MIB-1 of 66% and no HER-2 amplification by FISH.  The patient's case was discussed at the multidisciplinary breast cancer conference 08/08/2014 and it was felt the patient would benefit from breast conserving surgery and likely would need an Oncotype to decide on optimal systemic therapy. She would need radiation and hormones. The question of genetics counseling was also raised.  On 08/27/2014 the patient underwent right lumpectomy and sentinel lymph node sampling. The final pathology ([id number]) confirmed invasive ductal carcinoma, grade 3, measuring 1.9 cm. There was evidence of lymphovascular and perineural invasion. Margins were negative but close, the closest margin for the in situ component being 1 mm. The single sentinel lymph node was negative. Repeat HER-2 was  again negative, with a signals ratio of 0.97 and a number per cell of 1.60.  The patient's subsequent history is as detailed below   PAST MEDICAL HISTORY: Past Medical History:    Diagnosis Date  . Anxiety    relative for to pain & frustration of prev. hospitalization   . Arthritis    degenerative lumbar spine    . Breast cancer ([place]) 2016  . Breast cancer of lower-inner quadrant of right female breast ([place])   . Chronic back pain   . Colon cancer ([place])   . DVT (deep venous thrombosis) ([facility]) 03/2014   R leg, post op- on Xarelto   . Family history of adverse reaction to anesthesia    PONV  . Family history of breast cancer   . Family history of colon cancer   . GERD (gastroesophageal reflux disease)    used during chemo, has not taken recently  . Gestational diabetes 1986; 1996  . H/O cardiovascular stress test 2012    test done for stress from her Father dying, had chest pain ,pt. had a stress/echo  test, told it was wnl  . Hypertension    pt. reports that she has been higher in the past & again now but doesn't take any med.,never has for ^BP, pt. stating that BP is high now b/c of her pain in her back.    . Hypoxia    after surgery  . Migraines    "at least q other week" (08/27/2014)  . Personal history of chemotherapy    2016  . Personal history of radiation therapy    2016  . PONV (postoperative nausea and vomiting)    low O2 sats, < 50% post op  . Radiation 01/17/15-03/06/15   right breast 50.4 gray, lumpectomy cavity boost to 12 gray    PAST SURGICAL HISTORY: Past Surgical History:  Procedure Laterality Date  . AXILLARY SENTINEL NODE BIOPSY Right 08/27/2014   Archie Endo 08/27/2014  . BACK SURGERY    . BREAST BIOPSY Right 07/24/2014   core biopsy  . BREAST LUMPECTOMY Right 08/27/2014   Archie Endo 08/27/2014  . COLONOSCOPY W/ POLYPECTOMY  2015   found tubulovillous adenoma with focal high grade displasia  . DENTAL SURGERY  ~ 2011   "replaced bone graft upper jaw; put 2 implants in"  . ENDOMETRIAL ABLATION  ~ 1998  . EXCISIONAL HEMORRHOIDECTOMY  2015  . EYE MUSCLE SURGERY Bilateral ~ 1965   to fix cross-eye as child  . LAPAROSCOPIC GASTRIC SLEEVE  RESECTION N/A 03/02/2017   Procedure: LAPAROSCOPIC GASTRIC SLEEVE RESECTION WITH UPPER ENDOSCOPY;  Surgeon: [name], MD;  Location: [facility];  Service: General;  Laterality: N/A;  . PLANTAR FASCIA RELEASE Right ~ 2000  . PORT-A-CATH REMOVAL Right 12/31/2014   Procedure: REMOVAL PORT-A-CATH;  Surgeon: [name], MD;  Location: [facility];  Service: General;  Laterality: Right;  . PORTACATH PLACEMENT N/A 09/19/2014   Procedure: INSERTION PORT-A-CATH;  Surgeon: [name], MD;  Location: [facility];  Service: General;  Laterality: N/A;  . RADIOACTIVE SEED GUIDED PARTIAL MASTECTOMY WITH AXILLARY SENTINEL LYMPH NODE BIOPSY Right 08/27/2014   Procedure: RADIOACTIVE SEED GUIDED RIGHT BREAST LUMPECTOMY WITH RIGHT AXILLARY SENTINEL LYMPH NODE BIOPSY;  Surgeon: [name], MD;  Location: [facility];  Service: General;  Laterality: Right;  . RADIOFREQUENCY ABLATION NERVES     x 2 to nerves in her lower back  . SACROILIAC JOINT FUSION Right 02/22/2014   Procedure: SACROILIAC JOINT FUSION;  Surgeon: Sinclair Ship,  MD;  Location: [facility];  Service: Orthopedics;  Laterality: Right;  Right sided sacroiliac joint fusion  . TONSILLECTOMY  1988  . TUBAL LIGATION  1996    FAMILY HISTORY Family History  Problem Relation Age of Onset  . Breast cancer Cousin 69       maternal first cousin  . Colon cancer Maternal Aunt 75  . Throat cancer Maternal Aunt 75       smoker  . Breast cancer Paternal Aunt 96  . Heart disease Mother   . Heart disease Father   . Diabetes type II Father   . Stomach cancer Maternal Uncle        dx in his 14s  . Colon cancer Cousin 36        maternal first cousin  . Prostate cancer Cousin 74       paternal first cousin  . Breast cancer Paternal Aunt        dx in her 7s  . Breast cancer Other        mother's paternal first cousin  . Colon cancer Other 23       MGF's sister  . Brain cancer Other 80       MGFs sister  . Breast cancer Other        MGF's paternal  aunt  . Prostate cancer Other        MGF's paternal first cousin  . Breast cancer Paternal Aunt   . Cancer Other   . Hyperlipidemia Other   . Diabetes Other    the patient's father died at the age of 33 from heart failure in the setting of diabetes. The patient's mother is currently 27 years old. The patient has 3 brothers, 2 sisters. There is no history of breast, ovarian, or colon cancer in first-degree relatives. However, on the mother's side the patient has 3 relatives with breast cancer (a cousin diagnosed age 23, a second cousin diagnosed age 24, and a great aunt). There is also a history of colon cancer diagnosed age around age 34 in a great aunt and small intestinal cancer in an aunt diagnosed age 23. On the father's side there are 2 cousins with breast cancer diagnosed age 34 and 20, and a cousin with colon cancer diagnosed age 53   GYNECOLOGIC HISTORY:  No LMP recorded. Patient has had an ablation. Menarche age 60, the patient carried 2 children to term, the first at age 74. After the first live birth she had a premature girl who died after one day (she had been a breech birth) and she also had 3 miscarriages. The patient underwent endometrial ablation in 1998 and has had no periods since that time.   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 Her husband Teresa Aguilar works for [employer] in the [place] and Manufacturing systems engineer. Son Teresa Aguilar lives in [place] and works in Education officer, community. He was an Gaffer) and son Teresa Aguilar is 37 and works for [employer]. He also lives in [place]. The patient has one granddaughter. The patient is a Psychologist, forensic    ADVANCED DIRECTIVES: Not in place   HEALTH MAINTENANCE: Social History   Tobacco Use  . Smoking status: Never Smoker  . Smokeless tobacco: Never Used  Substance Use Topics  . Alcohol use: No  . Drug use: No     Colonoscopy:  June 2015/May and last repeat planned 10/03/2014  PAP: 2015  Bone density: 09/03/2017 shows a T score of -1.4 osteopenia  Lipid panel:  Allergies  Allergen Reactions  . Morphine And Related Nausea And Vomiting  . Sulfa Antibiotics Hives  . Meloxicam Rash    Current Outpatient Medications  Medication Sig Dispense Refill  . Aspirin Buf,AlHyd-MgHyd-CaCar, (ASCRIPTIN) 325 MG TABS Take by mouth. 360 tablet 0  . cholecalciferol (VITAMIN D3) 25 MCG (1000 UNIT) tablet Take 1 tablet (1,000 Units total) by mouth daily.    Marland Kitchen eletriptan (RELPAX) 40 MG tablet Take 40 mg by mouth every 2 (two) hours as needed for migraine. Reported on 10/01/2015     No current facility-administered medications for this visit.    OBJECTIVE:  white woman in no acute distress   Vitals:   01/01/20 0910  BP: (!) 148/88  Pulse: 78  Resp: 18  Temp: 97.8 F (36.6 C)  SpO2: 100%     Body mass index is 41.69 kg/m.    ECOG FS:1 - Symptomatic but completely ambulatory Filed Weights   01/01/20 0910  Weight: 248 lb 9.6 oz (112.8 kg)    Sclerae unicteric, EOMs intact Wearing a mask No cervical or supraclavicular adenopathy Lungs no rales or rhonchi Heart regular rate and rhythm Abd soft, obese, nontender, positive bowel sounds MSK no focal spinal tenderness, no upper extremity lymphedema Neuro: nonfocal, well oriented, appropriate affect Breasts: The right breast is status post lumpectomy and radiation with no evidence of local recurrence.  The left breast is benign.  Both axillae are benign.   CMP     Component Value Date/Time   NA 142 12/06/2017 1340   NA 143 09/21/2016 1131   K 4.4 12/06/2017 1340   K 4.0 09/21/2016 1131   CL 107 12/06/2017 1340   CO2 27 12/06/2017 1340   CO2 26 09/21/2016 1131   GLUCOSE 97 12/06/2017 1340   GLUCOSE 189 (H) 09/21/2016 1131   BUN 19 12/06/2017 1340   BUN 15.9 09/21/2016 1131   CREATININE 0.71 12/06/2017 1340   CREATININE 0.9 09/21/2016 1131   CALCIUM 9.1  12/06/2017 1340   CALCIUM 9.4 09/21/2016 1131   PROT 6.7 12/06/2017 1340   PROT 6.9 09/21/2016 1131   ALBUMIN 4.0 12/06/2017 1340   ALBUMIN 3.9 09/21/2016 1131   AST 15 12/06/2017 1340   AST 22 09/21/2016 1131   ALT 9 12/06/2017 1340   ALT 26 09/21/2016 1131   ALKPHOS 101 12/06/2017 1340   ALKPHOS 91 09/21/2016 1131   BILITOT 0.5 12/06/2017 1340   BILITOT 0.55 09/21/2016 1131   GFRNONAA >60 12/06/2017 1340   GFRAA >60 12/06/2017 1340    INo results found for: SPEP, UPEP  Lab Results  Component Value Date   WBC 4.0 12/06/2017   NEUTROABS 2.8 12/06/2017   HGB 13.6 12/06/2017   HCT 40.9 12/06/2017   MCV 94.2 12/06/2017   PLT 96 (L) 12/06/2017      Chemistry      Component Value Date/Time   NA 142 12/06/2017 1340   NA 143 09/21/2016 1131   K 4.4 12/06/2017 1340   K 4.0 09/21/2016 1131   CL 107 12/06/2017 1340   CO2 27 12/06/2017 1340   CO2 26 09/21/2016 1131   BUN 19 12/06/2017 1340   BUN 15.9 09/21/2016 1131   CREATININE 0.71 12/06/2017 1340   CREATININE 0.9 09/21/2016 1131      Component Value Date/Time   CALCIUM 9.1 12/06/2017 1340   CALCIUM 9.4 09/21/2016 1131   ALKPHOS 101 12/06/2017 1340  ALKPHOS 91 09/21/2016 1131   AST 15 12/06/2017 1340   AST 22 09/21/2016 1131   ALT 9 12/06/2017 1340   ALT 26 09/21/2016 1131   BILITOT 0.5 12/06/2017 1340   BILITOT 0.55 09/21/2016 1131       No results found for: LABCA2  No components found for: LABCA125  No results for input(s): INR in the last 168 hours.  Urinalysis    Component Value Date/Time   COLORURINE AMBER (A) 02/21/2014 1540   APPEARANCEUR CLOUDY (A) 02/21/2014 1540   LABSPEC 1.029 02/21/2014 1540   PHURINE 5.0 02/21/2014 1540   GLUCOSEU NEGATIVE 02/21/2014 1540   HGBUR SMALL (A) 02/21/2014 1540   BILIRUBINUR SMALL (A) 02/21/2014 1540   KETONESUR 15 (A) 02/21/2014 1540   PROTEINUR NEGATIVE 02/21/2014 1540   UROBILINOGEN 1.0 02/21/2014 1540   NITRITE POSITIVE (A) 02/21/2014 1540    LEUKOCYTESUR TRACE (A) 02/21/2014 1540    STUDIES: MM DIAG BREAST TOMO BILATERAL  Result Date: 12/29/2019 CLINICAL DATA:  59 year old who underwent malignant lumpectomy of the LOWER INNER QUADRANT of the RIGHT breast in 2016, grade 2-3 invasive ductal carcinoma with lymphovascular invasion. She underwent adjuvant radiation therapy and chemotherapy. Annual evaluation. EXAM: DIGITAL DIAGNOSTIC BILATERAL MAMMOGRAM WITH TOMO AND CAD COMPARISON:  Previous exam(s). ACR Breast Density Category b: There are scattered areas of fibroglandular density. FINDINGS: Standard 2D and tomosynthesis full field CC and MLO views of both breasts were obtained. Standard spot magnification CC view of the lumpectomy site in the RIGHT breast was also obtained. Mild post lumpectomy scar/architectural distortion involving the UPPER INNER QUADRANT of the RIGHT breast at MIDDLE to POSTERIOR depth. No new or suspicious findings in the RIGHT breast. No findings suspicious for malignancy in the LEFT breast. Mammographic images were processed with CAD. IMPRESSION: 1. No mammographic evidence of malignancy involving either breast. 2. Expected post lumpectomy changes involving the RIGHT breast. RECOMMENDATION: As the patient is now 5 years out from her lumpectomy, she may return to annual screening. Screening mammogram in one year is recommended.(Code:SM-B-01Y) I have discussed the findings and recommendations with the patient. If applicable, a reminder letter will be sent to the patient regarding the next appointment. BI-RADS CATEGORY  2: Benign. Electronically Signed   By: Evangeline Dakin M.D.   On: 12/29/2019 13:57     ASSESSMENT: 59 y.o. [place] woman status post right breast lower inner quadrant lumpectomy and sentinel lymph node sampling 08/27/2014 for a pT1c pN0, stage IA invasive ductal carcinoma, grade 3, estrogen and progesterone receptor positive, HER-2 negative, with an MIB-1 of 66%  (1) adjuvant chemotherapy with  cyclophosphamide and docetaxel every 21 days 4, with onpro support, completed 11/26/2014  (2) adjuvant radiation 01/17/2015-03/06/2015: Right breast 50.4 gray in 28 fractions, lumpectomy cavity boost 12 gray in 6 fractions  (3) anastrozole: started 04/16/2015, completing five year August 2021  (a) bone density scan 01/24/2015 shows osteopenia with a T score of -1.7.  (b) bone density on 09/03/2017 shows a T score of -2.3 osteopenia  (4) genetics panel showed no deleterious mutations in APC, ATM, AXIN2, BARD1, BMPR1A, BRCA1, BRCA2, BRIP1, CDH1, CDK4, CDKN2A, CHEK2, EPCAM, FANCC, MLH1, MSH2, MSH6, MUTYH, NBN, PALB2, PMS2, POLD1, POLE, PTEN, RAD51C, RAD51D, SCG5/GREM1, SMAD4, STK11, TP53, VHL, and XRCC2.   (5) RLE DVT documented 03/20/2014 following right sacroiliac joint effusion 02/22/2014  (a) hypercoagulable panel normal  (b) received Rivaroxaban for 6 months, completed April 2016   (c) right lower extremity venous Doppler 04/02/2015 was read as suspicious for a small  nonocclusive thrombus in the right distal femoral vein. This is felt to be a very low thrombus pertinent with preserved phasic flow and augmentation.  (d) rivaroxaban resumed October 2016--to be continued indefinitely  (e) switched to ASA 325 mg/d Jan 2021 for insurance/cost reasons  (6) morbid obesity: Status post laparoscopic sleeve gastrectomy 03/02/2017  (7) thrombocytopenia, stable   PLAN: Teresa Aguilar is now a little over 5 years out from definitive surgery for her early stage breast cancer with no evidence of disease recurrence.  This is very favorable.  She has just about completed 5 years of antiestrogens.  I do not think she would get any significant benefit from continuing beyond this point so I am happy to have her stop the anastrozole and "graduate".  Of course she does have significant osteopenia and may have osteoporosis when she gets the next bone density.  Stopping the anastrozole will help in that regard.  She  is not able to walk very well.  She has significant back pain.  She does take vitamin D.  She may benefit from Fosamax or Boniva and she may discuss this with her primary care physician at their discretion.  She has chronic mild thrombocytopenia.  So long as the platelet count remains above 50,000 I do not think any further evaluation is necessary.  As far as breast cancer follow-up is concerned all she needs is a yearly mammogram and yearly physician breast exam  I will be glad to see Teresa Aguilar again at any point in the future but as of now are making no further routine appointments for her here.  Total encounter time 25 minutes.*  Elijha Dedman, [name], MD  01/01/20 9:34 AM Medical Oncology and Hematology [facility] [address] [place], [place] [address] Tel. [phone]    Fax. [phone]   I, Wilburn Mylar, am acting as scribe for Dr. [name]. Leonidas Boateng.  Discontinue   *Total Encounter Time as defined by the Centers for Medicare and Medicaid Services includes, in addition to the face-to-face time of a patient visit (documented in the note above) non-face-to-face time: obtaining and reviewing outside history, ordering and reviewing medications, tests or procedures, care coordination (communications with other health care professionals or caregivers) and documentation in the medical record.

## 2020-01-01 ENCOUNTER — Other Ambulatory Visit: Payer: Self-pay

## 2020-01-01 ENCOUNTER — Inpatient Hospital Stay: Payer: Medicare Other | Attending: Oncology | Admitting: Oncology

## 2020-01-01 VITALS — BP 148/88 | HR 78 | Temp 97.8°F | Resp 18 | Ht 64.75 in | Wt 248.6 lb

## 2020-01-01 DIAGNOSIS — C50311 Malignant neoplasm of lower-inner quadrant of right female breast: Secondary | ICD-10-CM

## 2020-01-01 DIAGNOSIS — M8580 Other specified disorders of bone density and structure, unspecified site: Secondary | ICD-10-CM | POA: Diagnosis not present

## 2020-01-01 DIAGNOSIS — M549 Dorsalgia, unspecified: Secondary | ICD-10-CM | POA: Insufficient documentation

## 2020-01-01 DIAGNOSIS — Z79899 Other long term (current) drug therapy: Secondary | ICD-10-CM | POA: Insufficient documentation

## 2020-01-01 DIAGNOSIS — Z79811 Long term (current) use of aromatase inhibitors: Secondary | ICD-10-CM | POA: Insufficient documentation

## 2020-01-01 DIAGNOSIS — Z170 Estrogen receptor positive status [ER+]: Secondary | ICD-10-CM | POA: Diagnosis not present

## 2020-01-01 DIAGNOSIS — Z9884 Bariatric surgery status: Secondary | ICD-10-CM

## 2020-01-01 DIAGNOSIS — D696 Thrombocytopenia, unspecified: Secondary | ICD-10-CM

## 2020-01-01 DIAGNOSIS — Z6841 Body Mass Index (BMI) 40.0 and over, adult: Secondary | ICD-10-CM

## 2020-01-01 DIAGNOSIS — Z86718 Personal history of other venous thrombosis and embolism: Secondary | ICD-10-CM | POA: Diagnosis not present

## 2020-01-03 ENCOUNTER — Telehealth: Payer: Self-pay | Admitting: Oncology

## 2020-01-03 NOTE — Telephone Encounter (Signed)
No 7/19 los. No changes made to pt's schedule.

## 2020-03-25 ENCOUNTER — Other Ambulatory Visit: Payer: Self-pay

## 2020-03-25 ENCOUNTER — Ambulatory Visit
Admission: RE | Admit: 2020-03-25 | Discharge: 2020-03-25 | Disposition: A | Discharge status: Home / Self Care | Payer: BC Managed Care – PPO | Source: Ambulatory Visit | Attending: Oncology | Admitting: Oncology

## 2020-03-25 DIAGNOSIS — Z170 Estrogen receptor positive status [ER+]: Secondary | ICD-10-CM

## 2020-09-10 ENCOUNTER — Encounter (HOSPITAL_COMMUNITY): Payer: Self-pay | Admitting: *Deleted

## 2021-01-06 ENCOUNTER — Other Ambulatory Visit: Payer: Self-pay | Admitting: Oncology

## 2021-01-06 DIAGNOSIS — Z1231 Encounter for screening mammogram for malignant neoplasm of breast: Secondary | ICD-10-CM

## 2021-01-07 ENCOUNTER — Ambulatory Visit
Admission: RE | Admit: 2021-01-07 | Discharge: 2021-01-07 | Disposition: A | Discharge status: Home / Self Care | Payer: Medicare Other | Source: Ambulatory Visit | Attending: Oncology | Admitting: Oncology

## 2021-01-07 ENCOUNTER — Other Ambulatory Visit: Payer: Self-pay

## 2021-01-07 DIAGNOSIS — Z1231 Encounter for screening mammogram for malignant neoplasm of breast: Secondary | ICD-10-CM

## 2021-09-18 ENCOUNTER — Encounter (HOSPITAL_COMMUNITY): Payer: Self-pay | Admitting: *Deleted

## 2022-01-14 ENCOUNTER — Other Ambulatory Visit: Payer: Self-pay | Admitting: Family Medicine

## 2022-01-14 DIAGNOSIS — Z1231 Encounter for screening mammogram for malignant neoplasm of breast: Secondary | ICD-10-CM

## 2022-01-27 ENCOUNTER — Ambulatory Visit
Admission: RE | Admit: 2022-01-27 | Discharge: 2022-01-27 | Disposition: A | Discharge status: Home / Self Care | Payer: Medicare PPO | Source: Ambulatory Visit | Attending: Family Medicine | Admitting: Family Medicine

## 2022-01-27 DIAGNOSIS — Z1231 Encounter for screening mammogram for malignant neoplasm of breast: Secondary | ICD-10-CM

## 2022-09-22 ENCOUNTER — Other Ambulatory Visit: Payer: Self-pay | Admitting: Family Medicine

## 2022-09-22 DIAGNOSIS — Z1231 Encounter for screening mammogram for malignant neoplasm of breast: Secondary | ICD-10-CM

## 2022-10-01 ENCOUNTER — Other Ambulatory Visit: Payer: Self-pay | Admitting: Family Medicine

## 2022-10-01 ENCOUNTER — Encounter (HOSPITAL_COMMUNITY): Payer: Self-pay | Admitting: *Deleted

## 2022-10-01 DIAGNOSIS — M810 Age-related osteoporosis without current pathological fracture: Secondary | ICD-10-CM

## 2023-04-20 ENCOUNTER — Ambulatory Visit
Admission: RE | Admit: 2023-04-20 | Discharge: 2023-04-20 | Disposition: A | Discharge status: Home / Self Care | Payer: Medicare PPO | Source: Ambulatory Visit | Attending: Family Medicine | Admitting: Family Medicine

## 2023-04-20 DIAGNOSIS — Z1231 Encounter for screening mammogram for malignant neoplasm of breast: Secondary | ICD-10-CM

## 2023-04-20 DIAGNOSIS — M810 Age-related osteoporosis without current pathological fracture: Secondary | ICD-10-CM

## 2023-05-06 ENCOUNTER — Other Ambulatory Visit: Payer: Self-pay | Admitting: Medical Genetics

## 2023-05-06 DIAGNOSIS — Z006 Encounter for examination for normal comparison and control in clinical research program: Secondary | ICD-10-CM

## 2023-09-29 ENCOUNTER — Encounter (HOSPITAL_COMMUNITY): Payer: Self-pay | Admitting: *Deleted

## 2023-11-24 ENCOUNTER — Other Ambulatory Visit: Payer: Self-pay | Admitting: Family Medicine

## 2023-11-24 DIAGNOSIS — N644 Mastodynia: Secondary | ICD-10-CM

## 2023-12-06 ENCOUNTER — Other Ambulatory Visit: Payer: Self-pay | Admitting: Family Medicine

## 2023-12-06 DIAGNOSIS — H539 Unspecified visual disturbance: Secondary | ICD-10-CM

## 2023-12-06 DIAGNOSIS — R55 Syncope and collapse: Secondary | ICD-10-CM

## 2023-12-13 ENCOUNTER — Encounter: Payer: Self-pay | Admitting: Family Medicine

## 2023-12-15 ENCOUNTER — Ambulatory Visit
Admission: RE | Admit: 2023-12-15 | Discharge: 2023-12-15 | Disposition: A | Discharge status: Home / Self Care | Source: Ambulatory Visit | Attending: Family Medicine | Admitting: Family Medicine

## 2023-12-15 DIAGNOSIS — R55 Syncope and collapse: Secondary | ICD-10-CM

## 2023-12-15 DIAGNOSIS — H539 Unspecified visual disturbance: Secondary | ICD-10-CM

## 2024-02-16 ENCOUNTER — Ambulatory Visit
Admission: RE | Admit: 2024-02-16 | Discharge: 2024-02-16 | Disposition: A | Discharge status: Home / Self Care | Source: Ambulatory Visit | Attending: Family Medicine | Admitting: Family Medicine

## 2024-02-16 DIAGNOSIS — N644 Mastodynia: Secondary | ICD-10-CM

## 2024-03-17 ENCOUNTER — Telehealth: Payer: Self-pay | Admitting: Medical Genetics

## 2024-03-17 NOTE — Telephone Encounter (Signed)
 03/17/24  10:04 AM  Confirmed I was speaking with Teresa Aguilar [id number] . Participant requested to be withdrawn from the study. Participant did provide a reason for withdrawing. Reason was they were not interested in the results. Told the participant that we will move forward with the withdraw request. Participant's wuations were answered. Participant was thanked for their time.

## 2024-05-22 ENCOUNTER — Other Ambulatory Visit: Payer: Self-pay | Admitting: Family Medicine

## 2024-05-22 DIAGNOSIS — Z1231 Encounter for screening mammogram for malignant neoplasm of breast: Secondary | ICD-10-CM

## 2024-05-24 ENCOUNTER — Inpatient Hospital Stay: Admission: RE | Admit: 2024-05-24 | Discharge: 2024-05-24 | Attending: Family Medicine | Admitting: Family Medicine

## 2024-05-24 DIAGNOSIS — Z1231 Encounter for screening mammogram for malignant neoplasm of breast: Secondary | ICD-10-CM

## 2024-06-19 ENCOUNTER — Emergency Department (HOSPITAL_COMMUNITY)
Admission: EM | Admit: 2024-06-19 | Discharge: 2024-06-19 | Disposition: A | Discharge status: Home / Self Care | Attending: Emergency Medicine | Admitting: Emergency Medicine

## 2024-06-19 ENCOUNTER — Encounter (HOSPITAL_COMMUNITY): Payer: Self-pay

## 2024-06-19 ENCOUNTER — Emergency Department (HOSPITAL_COMMUNITY)

## 2024-06-19 ENCOUNTER — Other Ambulatory Visit: Payer: Self-pay

## 2024-06-19 DIAGNOSIS — R197 Diarrhea, unspecified: Secondary | ICD-10-CM | POA: Diagnosis present

## 2024-06-19 DIAGNOSIS — R55 Syncope and collapse: Secondary | ICD-10-CM | POA: Insufficient documentation

## 2024-06-19 DIAGNOSIS — R9389 Abnormal findings on diagnostic imaging of other specified body structures: Secondary | ICD-10-CM

## 2024-06-19 DIAGNOSIS — Z7982 Long term (current) use of aspirin: Secondary | ICD-10-CM | POA: Diagnosis not present

## 2024-06-19 DIAGNOSIS — J189 Pneumonia, unspecified organism: Secondary | ICD-10-CM | POA: Insufficient documentation

## 2024-06-19 DIAGNOSIS — R000 Tachycardia, unspecified: Secondary | ICD-10-CM | POA: Insufficient documentation

## 2024-06-19 LAB — CBC WITH DIFFERENTIAL/PLATELET
Abs Immature Granulocytes: 0.08 K/uL — ABNORMAL HIGH (ref 0.00–0.07)
Basophils Absolute: 0 K/uL (ref 0.0–0.1)
Basophils Relative: 0 %
Eosinophils Absolute: 0 K/uL (ref 0.0–0.5)
Eosinophils Relative: 0 %
HCT: 42.5 % (ref 36.0–46.0)
Hemoglobin: 13.8 g/dL (ref 12.0–15.0)
Immature Granulocytes: 1 %
Lymphocytes Relative: 4 %
Lymphs Abs: 0.3 K/uL — ABNORMAL LOW (ref 0.7–4.0)
MCH: 31.5 pg (ref 26.0–34.0)
MCHC: 32.5 g/dL (ref 30.0–36.0)
MCV: 97 fL (ref 80.0–100.0)
Monocytes Absolute: 0.5 K/uL (ref 0.1–1.0)
Monocytes Relative: 7 %
Neutro Abs: 7.1 K/uL (ref 1.7–7.7)
Neutrophils Relative %: 88 %
Platelets: 100 K/uL — ABNORMAL LOW (ref 150–400)
RBC: 4.38 MIL/uL (ref 3.87–5.11)
RDW: 13.6 % (ref 11.5–15.5)
WBC: 8 K/uL (ref 4.0–10.5)
nRBC: 0 % (ref 0.0–0.2)

## 2024-06-19 LAB — COMPREHENSIVE METABOLIC PANEL WITH GFR
ALT: 6 U/L (ref 0–44)
AST: 16 U/L (ref 15–41)
Albumin: 3.9 g/dL (ref 3.5–5.0)
Alkaline Phosphatase: 75 U/L (ref 38–126)
Anion gap: 11 (ref 5–15)
BUN: 9 mg/dL (ref 8–23)
CO2: 23 mmol/L (ref 22–32)
Calcium: 8.6 mg/dL — ABNORMAL LOW (ref 8.9–10.3)
Chloride: 105 mmol/L (ref 98–111)
Creatinine, Ser: 0.78 mg/dL (ref 0.44–1.00)
GFR, Estimated: >60 mL/min
Glucose, Bld: 130 mg/dL — ABNORMAL HIGH (ref 70–99)
Potassium: 3.9 mmol/L (ref 3.5–5.1)
Sodium: 139 mmol/L (ref 135–145)
Total Bilirubin: 1.1 mg/dL (ref 0.0–1.2)
Total Protein: 6.4 g/dL — ABNORMAL LOW (ref 6.5–8.1)

## 2024-06-19 LAB — RESP PANEL BY RT-PCR (RSV, FLU A&B, COVID)  RVPGX2
Influenza A by PCR: NEGATIVE
Influenza B by PCR: NEGATIVE
Resp Syncytial Virus by PCR: NEGATIVE
SARS Coronavirus 2 by RT PCR: NEGATIVE

## 2024-06-19 LAB — URINALYSIS, ROUTINE W REFLEX MICROSCOPIC
Bilirubin Urine: NEGATIVE
Glucose, UA: NEGATIVE mg/dL
Ketones, ur: NEGATIVE mg/dL
Nitrite: POSITIVE — AB
Protein, ur: NEGATIVE mg/dL
Specific Gravity, Urine: 1.006 (ref 1.005–1.030)
pH: 6 (ref 5.0–8.0)

## 2024-06-19 LAB — TROPONIN T, HIGH SENSITIVITY
Troponin T High Sensitivity: <15 ng/L (ref 0–19)
Troponin T High Sensitivity: <15 ng/L (ref 0–19)

## 2024-06-19 LAB — I-STAT CHEM 8, ED
BUN: 9 mg/dL (ref 8–23)
Calcium, Ion: 1.07 mmol/L — ABNORMAL LOW (ref 1.15–1.40)
Chloride: 105 mmol/L (ref 98–111)
Creatinine, Ser: 0.7 mg/dL (ref 0.44–1.00)
Glucose, Bld: 132 mg/dL — ABNORMAL HIGH (ref 70–99)
HCT: 42 % (ref 36.0–46.0)
Hemoglobin: 14.3 g/dL (ref 12.0–15.0)
Potassium: 3.8 mmol/L (ref 3.5–5.1)
Sodium: 139 mmol/L (ref 135–145)
TCO2: 23 mmol/L (ref 22–32)

## 2024-06-19 LAB — I-STAT CG4 LACTIC ACID, ED: Lactic Acid, Venous: 1.5 mmol/L (ref 0.5–1.9)

## 2024-06-19 LAB — LIPASE, BLOOD: Lipase: 20 U/L (ref 11–51)

## 2024-06-19 MED ORDER — DOXYCYCLINE HYCLATE 100 MG PO CAPS: 100.0000 mg | ORAL_CAPSULE | Freq: Two times a day (BID) | ORAL | 0 refills | Status: AC

## 2024-06-19 MED ORDER — METOCLOPRAMIDE HCL 5 MG/ML IJ SOLN
5.0000 mg | Freq: Once | INTRAMUSCULAR | Status: AC
  Administered 2024-06-19: 5 mg via INTRAVENOUS
  Filled 2024-06-19: qty 2

## 2024-06-19 MED ORDER — DIPHENHYDRAMINE HCL 50 MG/ML IJ SOLN
25.0000 mg | Freq: Once | INTRAMUSCULAR | Status: AC
  Administered 2024-06-19: 25 mg via INTRAVENOUS
  Filled 2024-06-19: qty 1

## 2024-06-19 MED ORDER — SODIUM CHLORIDE 0.9 % IV SOLN
1.0000 g | Freq: Once | INTRAVENOUS | Status: AC
  Administered 2024-06-19: 1 g via INTRAVENOUS
  Filled 2024-06-19: qty 10

## 2024-06-19 MED ORDER — LACTATED RINGERS IV BOLUS
1000.0000 mL | Freq: Once | INTRAVENOUS | Status: AC
  Administered 2024-06-19: 1000 mL via INTRAVENOUS

## 2024-06-19 MED ORDER — AMOXICILLIN-POT CLAVULANATE 875-125 MG PO TABS: 1.0000 | ORAL_TABLET | Freq: Two times a day (BID) | ORAL | 0 refills | Status: AC

## 2024-06-19 MED ORDER — SODIUM CHLORIDE 0.9 % IV SOLN
500.0000 mg | Freq: Once | INTRAVENOUS | Status: AC
  Administered 2024-06-19: 500 mg via INTRAVENOUS
  Filled 2024-06-19: qty 5

## 2024-06-19 MED ORDER — IOHEXOL 350 MG/ML SOLN
75.0000 mL | Freq: Once | INTRAVENOUS | Status: AC | PRN
  Administered 2024-06-19: 75 mL via INTRAVENOUS

## 2024-06-19 MED ORDER — ACETAMINOPHEN 325 MG PO TABS
650.0000 mg | ORAL_TABLET | Freq: Once | ORAL | Status: AC
  Administered 2024-06-19: 650 mg via ORAL
  Filled 2024-06-19: qty 2

## 2024-06-19 NOTE — ED Notes (Signed)
 Patient is still endorsing a pretty consistent headache after motrin and 650mg  of tylenol 

## 2024-06-19 NOTE — ED Triage Notes (Signed)
 Pt was BIB [facility] from home with complaints of diarrhea through out the night, fever 102. Tylenol  at 9am. No food today. Husband says she had a syncopal episode but caught her and lowered her to the floor. Vitals stable en route. 500 NS 20 GA LA. Restricted right arm breast cancer. Patient endorses Nausea & headache prior to EMS arrival. Patient endorses headache and dizziness en route.

## 2024-06-19 NOTE — Discharge Instructions (Addendum)
 It was a pleasure caring for you today in the emergency department.    You should return to the hospital if you experience return of persistent nausea and vomiting that does not resolve and does not allow you to tolerate any food or fluids, persistent fevers for greater than 2-3 more days, increasing abdominal pain that persists despite medications, persistent diarrhea, dizziness, syncope (fainting), or for any other concerns.   Your imaging today is concerning for pneumonia.  You have elected to treat this at home.  Please return if you develop any worsening worrisome symptoms, trouble breathing, have vomiting cannot take your medications, chest pain, difficulty breathing, etc.  If your diarrhea persist please follow-up with your PCP to provide a stool sample  Have someone stay with you until you feel stable. Do not drive, operate machinery, or play sports until your caregiver says it is okay. Keep all follow-up appointments as directed by your caregiver. Lie down right away if you start feeling like you might faint. Breathe deeply and steadily. Wait until all the symptoms have passed.Drink enough fluids to keep your urine clear or pale yellow. If you are taking blood pressure or heart medicine, get up slowly, taking several minutes to sit and then stand. This can reduce dizziness. SEEK IMMEDIATE MEDICAL CARE IF: You have a severe headache. You have unusual pain in the chest, abdomen, or back. You are bleeding from the mouth or rectum, or you have a black or tarry stool. You have an irregular or very fast heartbeat. You have pain with breathing. You have repeated fainting or seizure-like jerking during an episode. You faint when sitting or lying down. You have confusion. You have difficulty walking. You have severe weakness. You have vision problems. If you fainted, call your local emergency services - do not drive yourself to the hospital.  Follow-up with your PCP in the next week for recheck,  recommend repeat imaging once your pneumonia has been treated to ensure clearance  Your thyroid appears somewhat abnormal on the CT scan today, please see your pcp to arrange outpatient ultrasound of your thyroid gland.    Please return to the emergency department for any worsening or worrisome symptoms.

## 2024-06-19 NOTE — ED Provider Notes (Signed)
 " [facility] Provider Note   CSN: [id number] Arrival date & time: 06/19/24  1224     Patient presents with: No chief complaint on file.   Teresa Aguilar is a 64 y.o. female.   HPI 64 year old female presents with weakness, syncope, diarrhea and fever.  Symptoms started last night.  She has had a headache (gradual onset) that has progressively worsened though is now improving after taking some ibuprofen at home (EMS reported Tylenol  but she states it was ibuprofen).  She had a fever of 102.  She has had multiple diarrheal bowel movements without blood.  She had nausea but no vomiting.  No cough, shortness of breath, or urinary symptoms.  She apparently passed out when going from the bathroom back to her bedroom, but her husband caught her.  She is also having a little bit of chest pain that started. EMS reports she was tachycardic.  She does endorse that she had a cough for about a month that recently went away after some steroids in the last few days. She fell and hit her head about a month ago as well but feels like that recovered.  Prior to Admission medications  Medication Sig Start Date End Date Taking? Authorizing Provider  Aspirin Buf,AlHyd-MgHyd-CaCar, (ASCRIPTIN) 325 MG TABS Take by mouth. 01/01/20   Magrinat, Sandria BROCKS, MD  cholecalciferol (VITAMIN D3) 25 MCG (1000 UNIT) tablet Take 1 tablet (1,000 Units total) by mouth daily. 01/01/20   Magrinat, Sandria BROCKS, MD  eletriptan  (RELPAX ) 40 MG tablet Take 40 mg by mouth every 2 (two) hours as needed for migraine. Reported on 10/01/2015    [provider]    Allergies: Morphine and codeine, Sulfa antibiotics, and Meloxicam    Review of Systems  Constitutional:  Positive for fever.  Respiratory:  Negative for cough.   Cardiovascular:  Positive for chest pain.  Gastrointestinal:  Positive for diarrhea and nausea. Negative for abdominal pain and vomiting.  Genitourinary:  Negative for  dysuria.  Neurological:  Positive for syncope, light-headedness and headaches.    Updated Vital Signs BP 114/68 (BP Location: Left Arm)   Pulse (!) 108   Temp 98.6 F (37 C) (Oral)   Resp 16   SpO2 100%   Physical Exam Vitals and nursing note reviewed.  Constitutional:      General: She is not in acute distress.    Appearance: She is well-developed. She is not ill-appearing or diaphoretic.  HENT:     Head: Normocephalic and atraumatic.  Eyes:     Extraocular Movements: Extraocular movements intact.     Pupils: Pupils are equal, round, and reactive to light.  Cardiovascular:     Rate and Rhythm: Regular rhythm. Tachycardia present.     Heart sounds: Normal heart sounds.  Pulmonary:     Effort: Pulmonary effort is normal.     Breath sounds: Normal breath sounds. No wheezing.  Abdominal:     Palpations: Abdomen is soft.     Tenderness: There is no abdominal tenderness.  Musculoskeletal:     Cervical back: No rigidity.  Skin:    General: Skin is warm and dry.  Neurological:     Mental Status: She is alert.     Comments: CN 3-12 grossly intact. 5/5 strength in all 4 extremities. Grossly normal sensation. Normal finger to nose.      (all labs ordered are listed, but only abnormal results are displayed) Labs Reviewed  COMPREHENSIVE METABOLIC PANEL WITH  GFR - Abnormal; Notable for the following components:      Result Value   Glucose, Bld 130 (*)    Calcium 8.6 (*)    Total Protein 6.4 (*)    All other components within normal limits  CBC WITH DIFFERENTIAL/PLATELET - Abnormal; Notable for the following components:   Platelets 100 (*)    Lymphs Abs 0.3 (*)    Abs Immature Granulocytes 0.08 (*)    All other components within normal limits  I-STAT CHEM 8, ED - Abnormal; Notable for the following components:   Glucose, Bld 132 (*)    Calcium, Ion 1.07 (*)    All other components within normal limits  RESP PANEL BY RT-PCR (RSV, FLU A&B, COVID)  [id number]  CULTURE, BLOOD  (ROUTINE X 2)  CULTURE, BLOOD (ROUTINE X 2)  GASTROINTESTINAL PANEL BY PCR, STOOL (REPLACES STOOL CULTURE)  C DIFFICILE QUICK SCREEN W PCR REFLEX    LIPASE, BLOOD  URINALYSIS, ROUTINE W REFLEX MICROSCOPIC  I-STAT CG4 LACTIC ACID, ED  TROPONIN T, HIGH SENSITIVITY  TROPONIN T, HIGH SENSITIVITY    EKG: None  Radiology: DG Chest Portable 1 View Result Date: 06/19/2024 EXAM: 1 VIEW(S) XRAY OF THE CHEST 06/19/2024 01:14:06 PM COMPARISON: None available. CLINICAL HISTORY: fever FINDINGS: LUNGS AND PLEURA: Rounded masslike opacity in left upper lobe. No pleural effusion. No pneumothorax. HEART AND MEDIASTINUM: Aortic atherosclerosis. BONES AND SOFT TISSUES: No acute osseous abnormality. IMPRESSION: 1. Rounded masslike opacity in the left upper lobe, with chest CT recommended for further evaluation. Electronically signed by: Dayne Hassell MD 06/19/2024 01:57 PM EST RP Workstation: [id number]     Procedures   Medications Ordered in the ED  lactated ringers  bolus 1,000 mL (1,000 mLs Intravenous New Bag/Given 06/19/24 1301)  acetaminophen  (TYLENOL ) tablet 650 mg (650 mg Oral Given 06/19/24 1247)                                    Medical Decision Making Amount and/or Complexity of Data Reviewed Independent Historian: EMS Labs: ordered.    Details: Normal lactate. Normal WBC Radiology: ordered and independent interpretation performed.    Details: Left lung opacity ECG/medicine tests: ordered and independent interpretation performed.  Risk OTC drugs.   Patient with diarrhea and generalized weakness and syncope from this.  I suspect this is volume related.  No fevers here.  No signs of bacterial illness/sepsis at this time.  C. difficile and GI pathogen panel ordered but not done yet.  She is feeling better with fluids and was able to walk to and from the bathroom without recurrent symptoms.  Given her headache as well as relatively recent head injury, will get head CT given that we are  getting a CT of the chest given the chest x-ray findings.  Care transferred to Dr. Elnor.  She is not altered, low suspicion for meningitis.     Final diagnoses:  None    ED Discharge Orders     None          Freddi Hamilton, MD 06/19/24 1524  "

## 2024-06-19 NOTE — ED Provider Notes (Addendum)
 " Provider Note MRN:  [id number]  Arrival date & time: 06/19/2024    ED Course and Medical Decision Making  Assumed care from Dr. Elige at shift change.  See note from prior team for complete details, in brief:  64 year old female here with weakness, diarrhea, syncope. Ongoing diarrhea, fever, no BRBPR or melena Had LOC while going to the bathroom Labs are stable Feeling better with fluids Abnormal chest x-ray CT chest and CT head pending Syncopal episode seems likely vagal, no cp or dib, she had prodrome, occurred w/ exertion, no sz reported, has not re-occurred  Recommend rpt chest imaging once pna cleared to ensure not neoplasm  Clinical Course as of 06/19/24 1812  Mon Jun 19, 2024  1638 CT  chest c/w lobar PNA.  No hypoxia, PSI class 3  UA nitrite +, leuk +, rare bacteria, 0-5 wbc. UTI seems unlikely at this point  [SG]  1756 Discussed findings with the patient, recommended admission/obs; she prefers to go home.  She is HDS, she is breathing comfortably ambient air. This is reasonable given stable vitals and stable labs today.  She will follow-up with her PCP this coming week.  She received first round of antibiotics in the ER. She is tolerating p.o. intake without difficulty, no nausea or vomiting. [SG]    Clinical Course User Index [SG] Elnor Jayson LABOR, DO      6:12 PM:  I have discussed the diagnosis/risks/treatment options with the patient and family.  Evaluation and diagnostic testing in the emergency department does not suggest an emergent condition requiring admission or immediate intervention beyond what has been performed at this time.  They will follow up with pcp. We also discussed returning to the ED immediately if new or worsening sx occur. We discussed the sx which are most concerning (e.g., sudden worsening pain, fever, inability to tolerate by mouth) that necessitate immediate return.    The patient appears reasonably screened and/or stabilized for discharge and  I doubt any other medical condition or other [facility] requiring further screening, evaluation, or treatment in the ED at this time prior to discharge.    Procedures  Final Clinical Impressions(s) / ED Diagnoses     ICD-10-CM   1. Community acquired pneumonia, unspecified laterality  J18.9     2. Diarrhea, unspecified type  R19.7     3. Syncope, unspecified syncope type  R55       ED Discharge Orders     None         Discharge Instructions      It was a pleasure caring for you today in the emergency department.    You should return to the hospital if you experience return of persistent nausea and vomiting that does not resolve and does not allow you to tolerate any food or fluids, persistent fevers for greater than 2-3 more days, increasing abdominal pain that persists despite medications, persistent diarrhea, dizziness, syncope (fainting), or for any other concerns.   Your imaging today is concerning for pneumonia.  You have elected to treat this at home.  Please return if you develop any worsening worrisome symptoms, trouble breathing, have vomiting cannot take your medications, chest pain, difficulty breathing, etc.  If your diarrhea persist please follow-up with your PCP to provide a stool sample  Have someone stay with you until you feel stable. Do not drive, operate machinery, or play sports until your caregiver says it is okay. Keep all follow-up appointments as directed by your caregiver. Lie down  right away if you start feeling like you might faint. Breathe deeply and steadily. Wait until all the symptoms have passed.Drink enough fluids to keep your urine clear or pale yellow. If you are taking blood pressure or heart medicine, get up slowly, taking several minutes to sit and then stand. This can reduce dizziness. SEEK IMMEDIATE MEDICAL CARE IF: You have a severe headache. You have unusual pain in the chest, abdomen, or back. You are bleeding from the mouth or rectum, or you  have a black or tarry stool. You have an irregular or very fast heartbeat. You have pain with breathing. You have repeated fainting or seizure-like jerking during an episode. You faint when sitting or lying down. You have confusion. You have difficulty walking. You have severe weakness. You have vision problems. If you fainted, call your local emergency services - do not drive yourself to the hospital.  Follow-up with your PCP in the next week for recheck, recommend repeat imaging once your pneumonia has been treated to ensure clearance      Please return to the emergency department for any worsening or worrisome symptoms.          Elnor Jayson LABOR, DO 06/19/24 1812    Elnor Jayson LABOR, DO 06/19/24 1813  "

## 2024-06-22 LAB — URINE CULTURE: Culture: >=100000 — AB

## 2024-06-23 ENCOUNTER — Telehealth (HOSPITAL_BASED_OUTPATIENT_CLINIC_OR_DEPARTMENT_OTHER): Payer: Self-pay

## 2024-06-23 NOTE — Telephone Encounter (Signed)
 Post ED Visit - Positive Culture Follow-up  Culture report reviewed by antimicrobial stewardship pharmacist: [facility] [x]  Elma Fail, [place].D. []  Venetia Gully, Pharm.D., BCPS AQ-ID []  Garrel Crews, Pharm.D., BCPS []  Almarie Lunger, Pharm.D., BCPS []  [place], [place].D., BCPS, AAHIVP []  Rosaline Bihari, Pharm.D., BCPS, AAHIVP []  Vernell Meier, PharmD, BCPS []  Latanya Hint, PharmD, BCPS []  Donald Medley, PharmD, BCPS []  Rocky Bold, PharmD []  Dorothyann Alert, PharmD, BCPS []  Morene Babe, PharmD  [facility] []  Rosaline Edison, PharmD []  Romona Bliss, PharmD []  Dolphus Roller, PharmD []  Veva Seip, Rph []  Vernell Daunt) Leonce, PharmD []  Eva Allis, PharmD []  Rosaline Millet, PharmD []  Iantha Batch, PharmD []  Arvin Gauss, PharmD []  Wanda Hasting, PharmD []  Ronal Rav, PharmD []  Rocky Slade, PharmD []  Bard Jeans, PharmD   Positive urine culture Treated with Amoxicillin , organism sensitive to the same and no further patient follow-up is required at this time.  Ruth Camelia Elbe 06/23/2024, 9:07 AM

## 2024-06-24 LAB — CULTURE, BLOOD (ROUTINE X 2)
Culture: NO GROWTH
Culture: NO GROWTH

## 2024-06-28 ENCOUNTER — Other Ambulatory Visit (HOSPITAL_COMMUNITY): Payer: Self-pay | Admitting: Family Medicine

## 2024-06-28 DIAGNOSIS — R9389 Abnormal findings on diagnostic imaging of other specified body structures: Secondary | ICD-10-CM

## 2024-06-28 DIAGNOSIS — J189 Pneumonia, unspecified organism: Secondary | ICD-10-CM

## 2024-06-28 DIAGNOSIS — R918 Other nonspecific abnormal finding of lung field: Secondary | ICD-10-CM

## 2024-07-13 ENCOUNTER — Ambulatory Visit
Admission: RE | Admit: 2024-07-13 | Discharge: 2024-07-13 | Disposition: A | Discharge status: Home / Self Care | Source: Ambulatory Visit | Attending: Family Medicine | Admitting: Family Medicine

## 2024-07-13 DIAGNOSIS — R9389 Abnormal findings on diagnostic imaging of other specified body structures: Secondary | ICD-10-CM

## 2024-07-19 ENCOUNTER — Other Ambulatory Visit

## 2024-07-19 ENCOUNTER — Inpatient Hospital Stay: Admission: RE | Admit: 2024-07-19 | Discharge: 2024-07-19 | Attending: Family Medicine | Admitting: Family Medicine

## 2024-07-19 DIAGNOSIS — J189 Pneumonia, unspecified organism: Secondary | ICD-10-CM

## 2024-07-19 DIAGNOSIS — R918 Other nonspecific abnormal finding of lung field: Secondary | ICD-10-CM

## 2024-07-19 MED ORDER — IOPAMIDOL (ISOVUE-300) INJECTION 61%
75.0000 mL | Freq: Once | INTRAVENOUS | Status: AC | PRN
  Administered 2024-07-19: 75 mL via INTRAVENOUS
